# Patient Record
Sex: Female | Born: 1945
Health system: Southern US, Community
[De-identification: ages and names within clinical notes are randomized; demographics above are authoritative.]

## PROBLEM LIST (undated history)

## (undated) DIAGNOSIS — N200 Calculus of kidney: Secondary | ICD-10-CM

## (undated) DIAGNOSIS — B029 Zoster without complications: Secondary | ICD-10-CM

## (undated) DIAGNOSIS — I8310 Varicose veins of unspecified lower extremity with inflammation: Secondary | ICD-10-CM

## (undated) DIAGNOSIS — T7840XA Allergy, unspecified, initial encounter: Secondary | ICD-10-CM

## (undated) DIAGNOSIS — Z87440 Personal history of urinary (tract) infections: Secondary | ICD-10-CM

## (undated) DIAGNOSIS — R896 Abnormal cytological findings in specimens from other organs, systems and tissues: Secondary | ICD-10-CM

## (undated) DIAGNOSIS — C44209 Unspecified malignant neoplasm of skin of left ear and external auricular canal: Secondary | ICD-10-CM

## (undated) DIAGNOSIS — R112 Nausea with vomiting, unspecified: Secondary | ICD-10-CM

## (undated) DIAGNOSIS — Z9889 Other specified postprocedural states: Secondary | ICD-10-CM

## (undated) DIAGNOSIS — J189 Pneumonia, unspecified organism: Secondary | ICD-10-CM

## (undated) DIAGNOSIS — J45901 Unspecified asthma with (acute) exacerbation: Secondary | ICD-10-CM

## (undated) DIAGNOSIS — I4891 Unspecified atrial fibrillation: Secondary | ICD-10-CM

## (undated) DIAGNOSIS — M1990 Unspecified osteoarthritis, unspecified site: Secondary | ICD-10-CM

## (undated) DIAGNOSIS — M810 Age-related osteoporosis without current pathological fracture: Secondary | ICD-10-CM

## (undated) DIAGNOSIS — M8580 Other specified disorders of bone density and structure, unspecified site: Secondary | ICD-10-CM

## (undated) DIAGNOSIS — E785 Hyperlipidemia, unspecified: Secondary | ICD-10-CM

## (undated) DIAGNOSIS — Z87442 Personal history of urinary calculi: Secondary | ICD-10-CM

## (undated) HISTORY — DX: Abnormal cytological findings in specimens from other organs, systems and tissues: R89.6

## (undated) HISTORY — DX: Unspecified atrial fibrillation: I48.91

## (undated) HISTORY — DX: Varicose veins of unspecified lower extremity with inflammation: I83.10

## (undated) HISTORY — PX: KNEE ARTHROSCOPY: SHX127

## (undated) HISTORY — DX: Unspecified osteoarthritis, unspecified site: M19.90

## (undated) HISTORY — PX: OTHER SURGICAL HISTORY: SHX169

## (undated) HISTORY — PX: COLPOSCOPY: SHX161

## (undated) HISTORY — DX: Calculus of kidney: N20.0

## (undated) HISTORY — DX: Unspecified malignant neoplasm of skin of left ear and external auricular canal: C44.209

## (undated) HISTORY — DX: Hyperlipidemia, unspecified: E78.5

## (undated) HISTORY — DX: Allergy, unspecified, initial encounter: T78.40XA

## (undated) HISTORY — DX: Unspecified asthma with (acute) exacerbation: J45.901

## (undated) HISTORY — DX: Age-related osteoporosis without current pathological fracture: M81.0

## (undated) HISTORY — DX: Zoster without complications: B02.9

## (undated) HISTORY — DX: Other specified disorders of bone density and structure, unspecified site: M85.80

---

## 1998-01-11 ENCOUNTER — Other Ambulatory Visit: Admission: RE | Admit: 1998-01-11 | Discharge: 1998-01-11 | Payer: Self-pay | Admitting: Gynecology

## 1998-08-01 ENCOUNTER — Other Ambulatory Visit: Admission: RE | Admit: 1998-08-01 | Discharge: 1998-08-01 | Payer: Self-pay | Admitting: Gynecology

## 1999-03-02 ENCOUNTER — Other Ambulatory Visit: Admission: RE | Admit: 1999-03-02 | Discharge: 1999-03-02 | Payer: Self-pay | Admitting: Gynecology

## 1999-12-13 ENCOUNTER — Other Ambulatory Visit: Admission: RE | Admit: 1999-12-13 | Discharge: 1999-12-13 | Payer: Self-pay | Admitting: Gynecology

## 2000-12-27 ENCOUNTER — Other Ambulatory Visit: Admission: RE | Admit: 2000-12-27 | Discharge: 2000-12-27 | Payer: Self-pay | Admitting: Obstetrics and Gynecology

## 2000-12-30 ENCOUNTER — Other Ambulatory Visit: Admission: RE | Admit: 2000-12-30 | Discharge: 2000-12-30 | Payer: Self-pay | Admitting: Obstetrics and Gynecology

## 2002-04-14 ENCOUNTER — Other Ambulatory Visit: Admission: RE | Admit: 2002-04-14 | Discharge: 2002-04-14 | Payer: Self-pay | Admitting: Obstetrics and Gynecology

## 2002-12-16 ENCOUNTER — Other Ambulatory Visit: Admission: RE | Admit: 2002-12-16 | Discharge: 2002-12-16 | Payer: Self-pay | Admitting: Obstetrics and Gynecology

## 2004-04-19 ENCOUNTER — Other Ambulatory Visit: Admission: RE | Admit: 2004-04-19 | Discharge: 2004-04-19 | Payer: Self-pay | Admitting: Obstetrics and Gynecology

## 2004-08-17 ENCOUNTER — Ambulatory Visit: Payer: Self-pay | Admitting: Internal Medicine

## 2004-08-31 ENCOUNTER — Encounter: Admission: RE | Admit: 2004-08-31 | Discharge: 2004-08-31 | Payer: Self-pay | Admitting: Internal Medicine

## 2004-12-04 ENCOUNTER — Ambulatory Visit: Payer: Self-pay | Admitting: Internal Medicine

## 2005-01-18 ENCOUNTER — Ambulatory Visit: Payer: Self-pay | Admitting: Internal Medicine

## 2005-04-06 ENCOUNTER — Ambulatory Visit: Payer: Self-pay | Admitting: Internal Medicine

## 2005-05-02 ENCOUNTER — Other Ambulatory Visit: Admission: RE | Admit: 2005-05-02 | Discharge: 2005-05-02 | Payer: Self-pay | Admitting: Obstetrics and Gynecology

## 2005-07-19 ENCOUNTER — Ambulatory Visit: Payer: Self-pay | Admitting: Internal Medicine

## 2005-08-21 ENCOUNTER — Ambulatory Visit: Payer: Self-pay | Admitting: Internal Medicine

## 2005-09-25 ENCOUNTER — Ambulatory Visit: Payer: Self-pay | Admitting: Internal Medicine

## 2005-10-02 ENCOUNTER — Ambulatory Visit: Payer: Self-pay | Admitting: Internal Medicine

## 2005-10-15 DIAGNOSIS — IMO0001 Reserved for inherently not codable concepts without codable children: Secondary | ICD-10-CM

## 2005-10-15 HISTORY — DX: Reserved for inherently not codable concepts without codable children: IMO0001

## 2005-11-27 ENCOUNTER — Other Ambulatory Visit: Admission: RE | Admit: 2005-11-27 | Discharge: 2005-11-27 | Payer: Self-pay | Admitting: Obstetrics and Gynecology

## 2005-12-04 ENCOUNTER — Ambulatory Visit: Payer: Self-pay | Admitting: Internal Medicine

## 2006-02-05 ENCOUNTER — Ambulatory Visit: Payer: Self-pay | Admitting: Internal Medicine

## 2006-02-12 ENCOUNTER — Encounter: Payer: Self-pay | Admitting: Internal Medicine

## 2006-02-12 ENCOUNTER — Ambulatory Visit: Payer: Self-pay

## 2006-02-18 ENCOUNTER — Ambulatory Visit: Payer: Self-pay | Admitting: Internal Medicine

## 2006-03-12 ENCOUNTER — Ambulatory Visit (HOSPITAL_BASED_OUTPATIENT_CLINIC_OR_DEPARTMENT_OTHER): Admission: RE | Admit: 2006-03-12 | Discharge: 2006-03-12 | Payer: Self-pay | Admitting: Orthopedic Surgery

## 2006-04-30 ENCOUNTER — Other Ambulatory Visit: Admission: RE | Admit: 2006-04-30 | Discharge: 2006-04-30 | Payer: Self-pay | Admitting: Obstetrics and Gynecology

## 2006-07-30 ENCOUNTER — Ambulatory Visit: Payer: Self-pay | Admitting: Internal Medicine

## 2006-07-31 LAB — CONVERTED CEMR LAB
Free T4: 0.8 ng/dL — ABNORMAL LOW (ref 0.9–1.8)
TSH: 0.83 microintl units/mL (ref 0.35–5.50)

## 2006-08-12 ENCOUNTER — Encounter: Admission: RE | Admit: 2006-08-12 | Discharge: 2006-08-12 | Payer: Self-pay | Admitting: Internal Medicine

## 2006-08-29 ENCOUNTER — Ambulatory Visit: Payer: Self-pay

## 2006-09-03 ENCOUNTER — Ambulatory Visit: Payer: Self-pay | Admitting: Internal Medicine

## 2006-10-15 HISTORY — PX: SPINE SURGERY: SHX786

## 2006-10-25 ENCOUNTER — Other Ambulatory Visit: Admission: RE | Admit: 2006-10-25 | Discharge: 2006-10-25 | Payer: Self-pay | Admitting: Obstetrics and Gynecology

## 2006-10-31 ENCOUNTER — Ambulatory Visit: Payer: Self-pay | Admitting: Internal Medicine

## 2006-12-30 ENCOUNTER — Ambulatory Visit: Payer: Self-pay | Admitting: Internal Medicine

## 2007-01-30 ENCOUNTER — Ambulatory Visit: Payer: Self-pay | Admitting: Internal Medicine

## 2007-03-27 ENCOUNTER — Ambulatory Visit: Payer: Self-pay | Admitting: Internal Medicine

## 2007-04-16 ENCOUNTER — Ambulatory Visit: Payer: Self-pay | Admitting: Internal Medicine

## 2007-04-21 ENCOUNTER — Encounter: Admission: RE | Admit: 2007-04-21 | Discharge: 2007-04-21 | Payer: Self-pay | Admitting: Internal Medicine

## 2007-04-24 DIAGNOSIS — M1990 Unspecified osteoarthritis, unspecified site: Secondary | ICD-10-CM

## 2007-04-24 DIAGNOSIS — M810 Age-related osteoporosis without current pathological fracture: Secondary | ICD-10-CM | POA: Insufficient documentation

## 2007-04-24 HISTORY — DX: Unspecified osteoarthritis, unspecified site: M19.90

## 2007-04-30 ENCOUNTER — Encounter: Payer: Self-pay | Admitting: Internal Medicine

## 2007-05-06 ENCOUNTER — Other Ambulatory Visit: Admission: RE | Admit: 2007-05-06 | Discharge: 2007-05-06 | Payer: Self-pay | Admitting: Obstetrics and Gynecology

## 2007-05-09 ENCOUNTER — Encounter (INDEPENDENT_AMBULATORY_CARE_PROVIDER_SITE_OTHER): Payer: Self-pay | Admitting: Neurosurgery

## 2007-05-09 ENCOUNTER — Ambulatory Visit (HOSPITAL_COMMUNITY): Admission: RE | Admit: 2007-05-09 | Discharge: 2007-05-10 | Payer: Self-pay | Admitting: Neurosurgery

## 2007-05-09 ENCOUNTER — Encounter: Payer: Self-pay | Admitting: Internal Medicine

## 2007-06-04 ENCOUNTER — Encounter: Payer: Self-pay | Admitting: Internal Medicine

## 2007-07-15 ENCOUNTER — Encounter: Payer: Self-pay | Admitting: Internal Medicine

## 2007-08-11 ENCOUNTER — Telehealth: Payer: Self-pay | Admitting: Internal Medicine

## 2007-12-17 ENCOUNTER — Other Ambulatory Visit: Admission: RE | Admit: 2007-12-17 | Discharge: 2007-12-17 | Payer: Self-pay | Admitting: Obstetrics and Gynecology

## 2008-01-02 ENCOUNTER — Encounter: Payer: Self-pay | Admitting: Internal Medicine

## 2008-01-29 ENCOUNTER — Ambulatory Visit: Payer: Self-pay | Admitting: Internal Medicine

## 2008-01-29 LAB — CONVERTED CEMR LAB
ALT: 44 units/L — ABNORMAL HIGH (ref 0–35)
AST: 38 units/L — ABNORMAL HIGH (ref 0–37)
Albumin: 3.9 g/dL (ref 3.5–5.2)
BUN: 25 mg/dL — ABNORMAL HIGH (ref 6–23)
Bilirubin Urine: NEGATIVE
CO2: 32 meq/L (ref 19–32)
Chloride: 105 meq/L (ref 96–112)
Cholesterol: 191 mg/dL (ref 0–200)
Creatinine, Ser: 0.6 mg/dL (ref 0.4–1.2)
Ketones, urine, test strip: NEGATIVE
Specific Gravity, Urine: >=1.03
TSH: 1.04 microintl units/mL (ref 0.35–5.50)
Triglycerides: 62 mg/dL (ref 0–149)
pH: 6.5

## 2008-01-30 ENCOUNTER — Telehealth: Payer: Self-pay | Admitting: Internal Medicine

## 2008-02-05 ENCOUNTER — Ambulatory Visit: Payer: Self-pay | Admitting: Internal Medicine

## 2008-02-05 DIAGNOSIS — I8310 Varicose veins of unspecified lower extremity with inflammation: Secondary | ICD-10-CM | POA: Insufficient documentation

## 2008-02-05 DIAGNOSIS — J449 Chronic obstructive pulmonary disease, unspecified: Secondary | ICD-10-CM

## 2008-02-05 HISTORY — DX: Varicose veins of unspecified lower extremity with inflammation: I83.10

## 2008-02-24 ENCOUNTER — Encounter: Admission: RE | Admit: 2008-02-24 | Discharge: 2008-02-24 | Payer: Self-pay | Admitting: Internal Medicine

## 2008-03-11 ENCOUNTER — Encounter: Admission: RE | Admit: 2008-03-11 | Discharge: 2008-03-11 | Payer: Self-pay | Admitting: Interventional Radiology

## 2008-04-13 ENCOUNTER — Encounter: Admission: RE | Admit: 2008-04-13 | Discharge: 2008-04-13 | Payer: Self-pay | Admitting: Interventional Radiology

## 2008-04-20 ENCOUNTER — Encounter: Admission: RE | Admit: 2008-04-20 | Discharge: 2008-04-20 | Payer: Self-pay | Admitting: Interventional Radiology

## 2008-04-28 ENCOUNTER — Ambulatory Visit: Payer: Self-pay | Admitting: Internal Medicine

## 2008-05-07 ENCOUNTER — Ambulatory Visit: Payer: Self-pay | Admitting: Internal Medicine

## 2008-05-07 DIAGNOSIS — J309 Allergic rhinitis, unspecified: Secondary | ICD-10-CM

## 2008-05-12 ENCOUNTER — Encounter: Admission: RE | Admit: 2008-05-12 | Discharge: 2008-05-12 | Payer: Self-pay | Admitting: Interventional Radiology

## 2008-05-25 ENCOUNTER — Encounter: Admission: RE | Admit: 2008-05-25 | Discharge: 2008-05-25 | Payer: Self-pay | Admitting: Interventional Radiology

## 2008-08-06 ENCOUNTER — Ambulatory Visit: Payer: Self-pay | Admitting: Internal Medicine

## 2008-09-03 ENCOUNTER — Ambulatory Visit: Payer: Self-pay | Admitting: Internal Medicine

## 2008-11-08 ENCOUNTER — Telehealth: Payer: Self-pay | Admitting: Internal Medicine

## 2008-11-08 ENCOUNTER — Ambulatory Visit: Payer: Self-pay | Admitting: Internal Medicine

## 2008-11-08 DIAGNOSIS — J45901 Unspecified asthma with (acute) exacerbation: Secondary | ICD-10-CM | POA: Insufficient documentation

## 2008-11-08 DIAGNOSIS — B029 Zoster without complications: Secondary | ICD-10-CM

## 2008-11-08 HISTORY — DX: Unspecified asthma with (acute) exacerbation: J45.901

## 2008-11-08 HISTORY — DX: Zoster without complications: B02.9

## 2008-11-30 ENCOUNTER — Encounter: Payer: Self-pay | Admitting: Internal Medicine

## 2008-11-30 ENCOUNTER — Encounter: Admission: RE | Admit: 2008-11-30 | Discharge: 2008-11-30 | Payer: Self-pay | Admitting: Interventional Radiology

## 2009-02-03 ENCOUNTER — Ambulatory Visit: Payer: Self-pay | Admitting: Internal Medicine

## 2009-02-03 LAB — CONVERTED CEMR LAB
ALT: 22 units/L (ref 0–35)
Albumin: 4 g/dL (ref 3.5–5.2)
BUN: 24 mg/dL — ABNORMAL HIGH (ref 6–23)
Basophils Relative: 0.6 % (ref 0.0–3.0)
Bilirubin Urine: NEGATIVE
CO2: 31 meq/L (ref 19–32)
Calcium: 9.6 mg/dL (ref 8.4–10.5)
Cholesterol: 232 mg/dL — ABNORMAL HIGH (ref 0–200)
Creatinine, Ser: 0.8 mg/dL (ref 0.4–1.2)
Eosinophils Relative: 2 % (ref 0.0–5.0)
HCT: 42.4 % (ref 36.0–46.0)
Hemoglobin: 14.6 g/dL (ref 12.0–15.0)
Ketones, ur: NEGATIVE mg/dL
Leukocytes, UA: NEGATIVE
Lymphs Abs: 1.1 10*3/uL (ref 0.7–4.0)
MCV: 95.7 fL (ref 78.0–100.0)
Monocytes Absolute: 0.4 10*3/uL (ref 0.1–1.0)
Neutro Abs: 2.7 10*3/uL (ref 1.4–7.7)
Nitrite: NEGATIVE
Platelets: 171 10*3/uL (ref 150.0–400.0)
TSH: 1.75 microintl units/mL (ref 0.35–5.50)
Total Bilirubin: 0.9 mg/dL (ref 0.3–1.2)
Total Protein: 7.2 g/dL (ref 6.0–8.3)
Triglycerides: 44 mg/dL (ref 0.0–149.0)
Urobilinogen, UA: 0.2 (ref 0.0–1.0)
WBC: 4.3 10*3/uL — ABNORMAL LOW (ref 4.5–10.5)

## 2009-02-09 ENCOUNTER — Ambulatory Visit: Payer: Self-pay | Admitting: Internal Medicine

## 2009-02-09 DIAGNOSIS — E785 Hyperlipidemia, unspecified: Secondary | ICD-10-CM

## 2009-02-09 HISTORY — DX: Hyperlipidemia, unspecified: E78.5

## 2009-02-18 ENCOUNTER — Telehealth: Payer: Self-pay | Admitting: *Deleted

## 2009-02-21 ENCOUNTER — Encounter: Payer: Self-pay | Admitting: Internal Medicine

## 2009-03-01 ENCOUNTER — Encounter: Admission: RE | Admit: 2009-03-01 | Discharge: 2009-03-01 | Payer: Self-pay | Admitting: Interventional Radiology

## 2009-03-15 ENCOUNTER — Encounter: Admission: RE | Admit: 2009-03-15 | Discharge: 2009-03-15 | Payer: Self-pay | Admitting: Interventional Radiology

## 2009-03-22 ENCOUNTER — Encounter: Admission: RE | Admit: 2009-03-22 | Discharge: 2009-03-22 | Payer: Self-pay | Admitting: Interventional Radiology

## 2009-03-31 ENCOUNTER — Encounter: Payer: Self-pay | Admitting: Internal Medicine

## 2009-03-31 ENCOUNTER — Ambulatory Visit: Payer: Self-pay | Admitting: Obstetrics and Gynecology

## 2009-03-31 ENCOUNTER — Encounter: Payer: Self-pay | Admitting: Obstetrics and Gynecology

## 2009-03-31 ENCOUNTER — Other Ambulatory Visit: Admission: RE | Admit: 2009-03-31 | Discharge: 2009-03-31 | Payer: Self-pay | Admitting: Obstetrics and Gynecology

## 2009-04-12 ENCOUNTER — Encounter: Admission: RE | Admit: 2009-04-12 | Discharge: 2009-04-12 | Payer: Self-pay | Admitting: Interventional Radiology

## 2009-04-25 ENCOUNTER — Ambulatory Visit: Payer: Self-pay | Admitting: Obstetrics and Gynecology

## 2009-06-14 LAB — CONVERTED CEMR LAB: Pap Smear: NORMAL

## 2009-07-04 ENCOUNTER — Telehealth: Payer: Self-pay | Admitting: Internal Medicine

## 2009-10-01 ENCOUNTER — Ambulatory Visit: Payer: Self-pay | Admitting: Internal Medicine

## 2009-10-12 ENCOUNTER — Ambulatory Visit: Payer: Self-pay | Admitting: Internal Medicine

## 2009-11-29 ENCOUNTER — Encounter: Admission: RE | Admit: 2009-11-29 | Discharge: 2009-11-29 | Payer: Self-pay | Admitting: Interventional Radiology

## 2010-01-31 ENCOUNTER — Ambulatory Visit: Payer: Self-pay | Admitting: Internal Medicine

## 2010-01-31 ENCOUNTER — Telehealth: Payer: Self-pay | Admitting: Internal Medicine

## 2010-01-31 LAB — CONVERTED CEMR LAB
AST: 22 units/L (ref 0–37)
Albumin: 3.6 g/dL (ref 3.5–5.2)
Alkaline Phosphatase: 77 units/L (ref 39–117)
BUN: 25 mg/dL — ABNORMAL HIGH (ref 6–23)
Basophils Absolute: 0 10*3/uL (ref 0.0–0.1)
Bilirubin Urine: NEGATIVE
CO2: 31 meq/L (ref 19–32)
Calcium: 9.2 mg/dL (ref 8.4–10.5)
Direct LDL: 133.4 mg/dL
Eosinophils Absolute: 0.1 10*3/uL (ref 0.0–0.7)
GFR calc non Af Amer: 89.79 mL/min (ref >60)
Glucose, Bld: 90 mg/dL (ref 70–99)
HDL: 62.8 mg/dL (ref >39.00)
Lymphocytes Relative: 15.5 % (ref 12.0–46.0)
Lymphs Abs: 1.2 10*3/uL (ref 0.7–4.0)
MCHC: 34.2 g/dL (ref 30.0–36.0)
Monocytes Relative: 3.2 % (ref 3.0–12.0)
Neutro Abs: 6.2 10*3/uL (ref 1.4–7.7)
Platelets: 154 10*3/uL (ref 150.0–400.0)
RDW: 12.9 % (ref 11.5–14.6)
TSH: 2 microintl units/mL (ref 0.35–5.50)
Total Bilirubin: 0.7 mg/dL (ref 0.3–1.2)
Triglycerides: 57 mg/dL (ref 0.0–149.0)
Urine Glucose: NEGATIVE mg/dL
Urobilinogen, UA: 0.2 (ref 0.0–1.0)
VLDL: 11.4 mg/dL (ref 0.0–40.0)

## 2010-01-31 IMAGING — US US EXTREM LOW VENOUS*R*
1 series · 13 of 24 positions shown · non-contrast
Comparison: none

CLINICAL DATA: 62-year-old female with painful varicose veins.
BILATERAL LOWER EXTREMITY DUPLEX VENOUS ULTRASOUND OF THE
SUPERFICIAL AND DEEP VENOUS SYSTEMS:

[Series 1: us extrem low venous*right* · 13 of 68 slices shown]
[im 1/68]
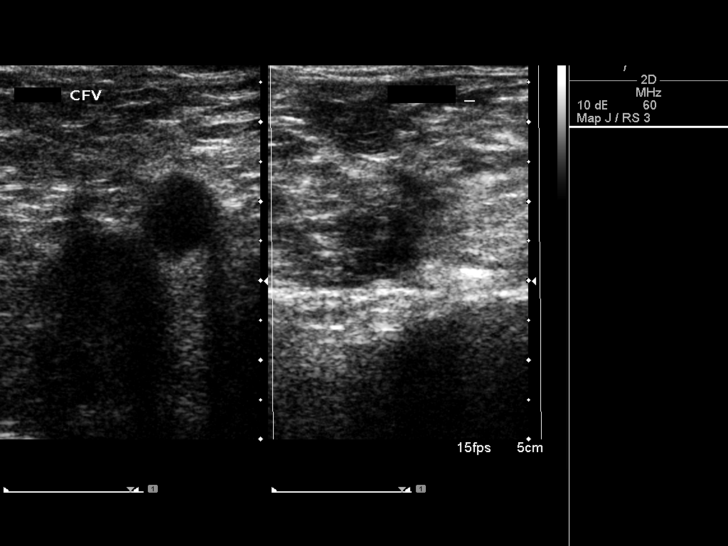
[im 6/68]
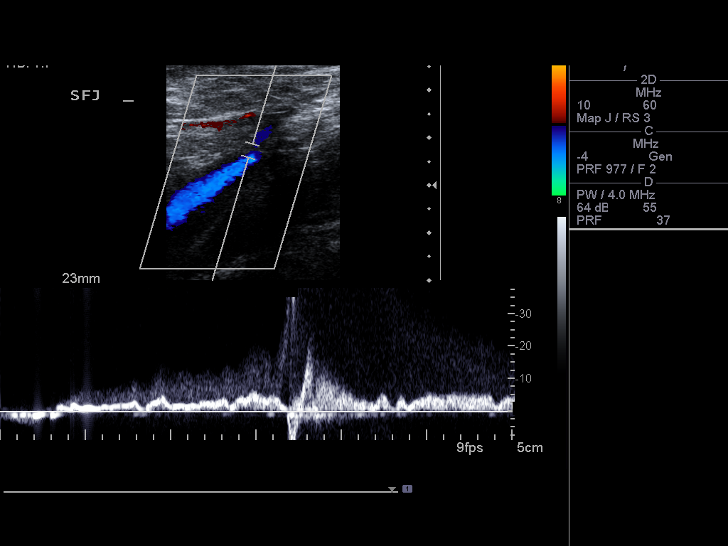
[im 12/68]
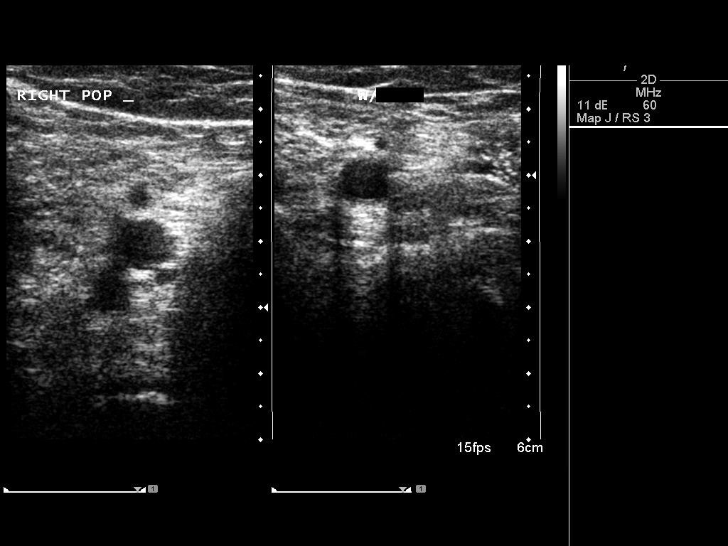
[im 18/68]
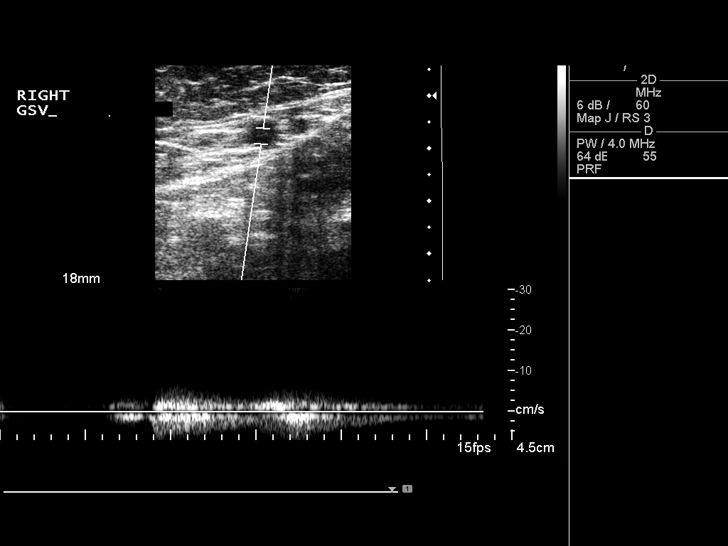
[im 24/68]
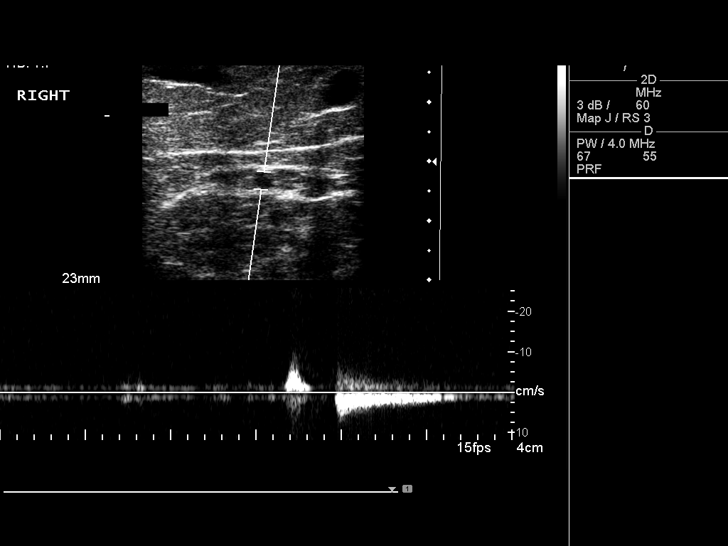
[im 30/68]
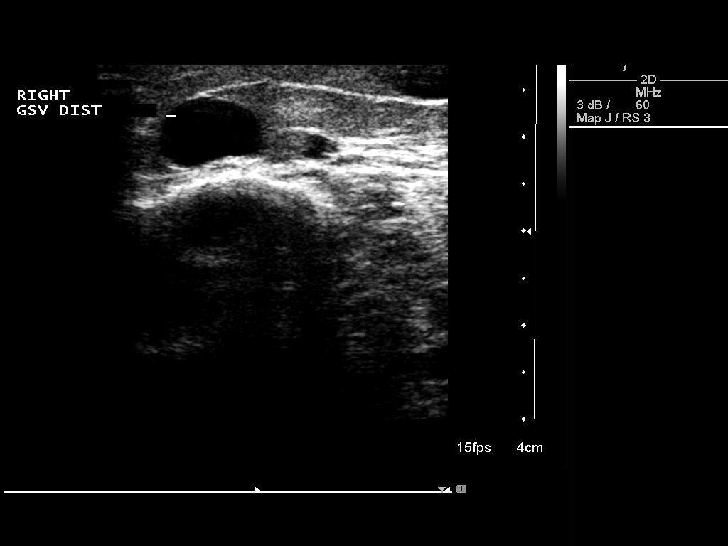
[im 35/68]
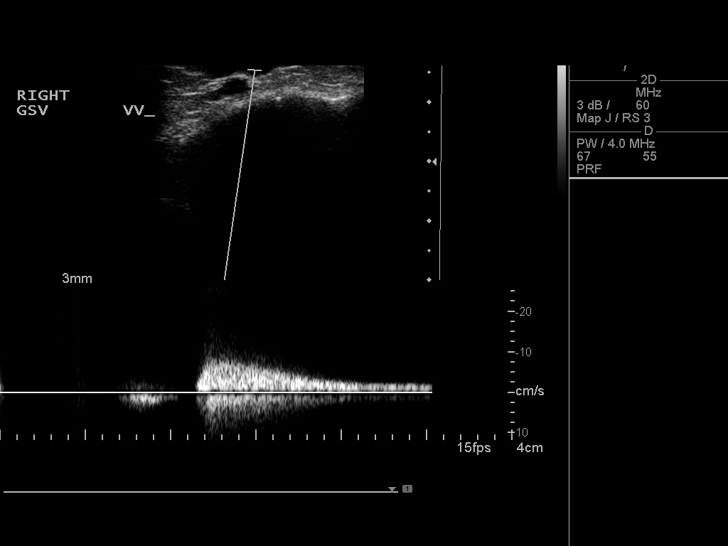
[im 38/68]
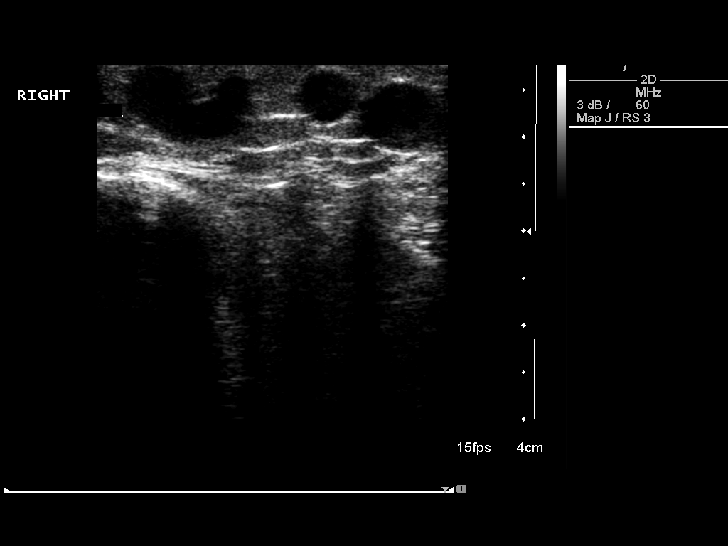
[im 44/68]
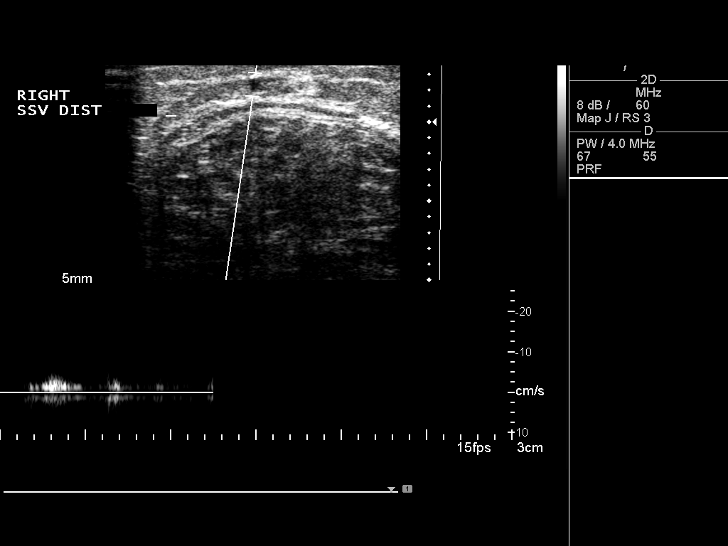
[im 50/68]
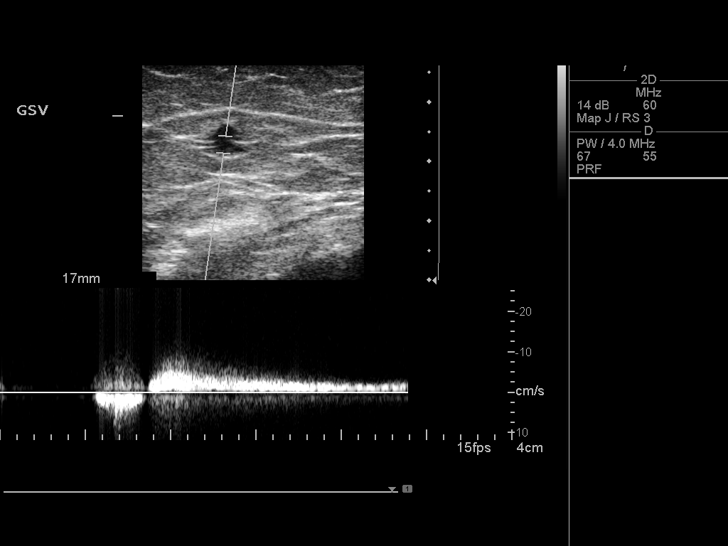
[im 56/68]
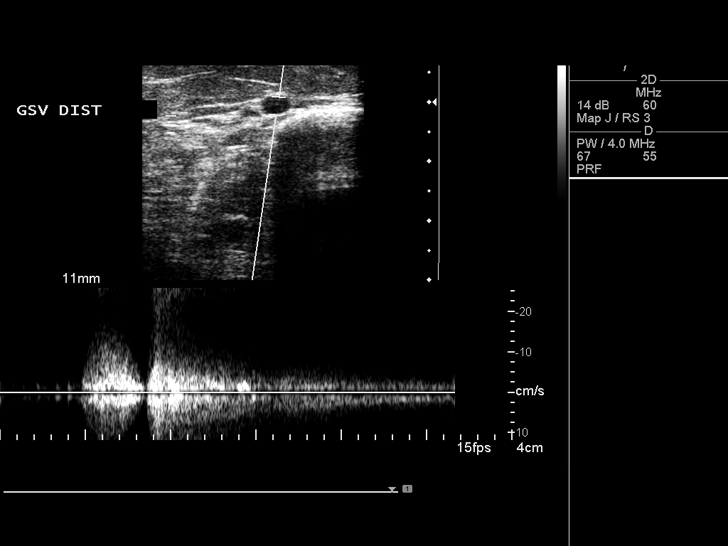
[im 62/68]
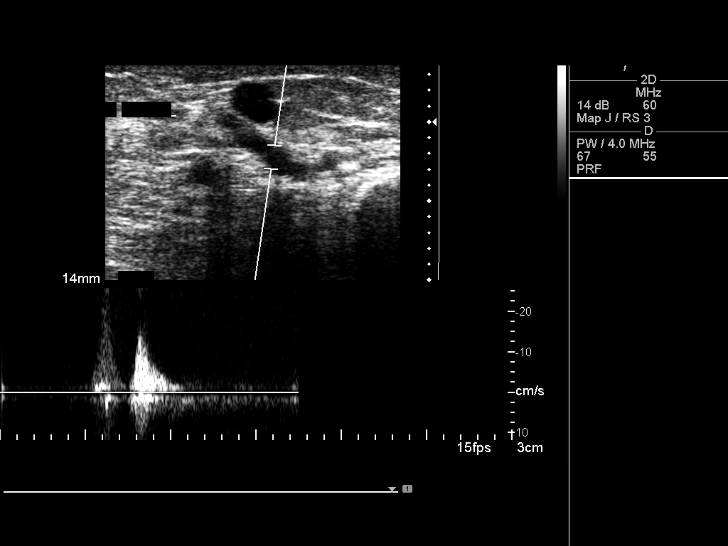
[im 68/68]
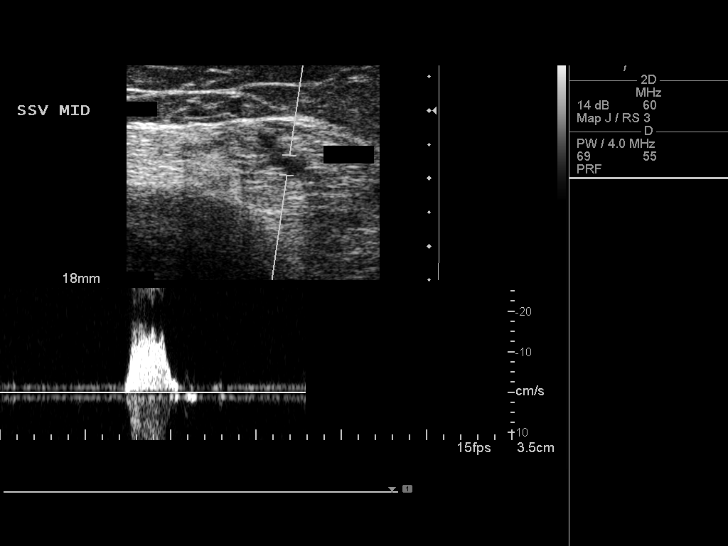

[13 of 24 positions shown; findings below may reference images not displayed]

FINDINGS: RIGHT LOWER EXTREMITY:
The right great saphenous vein demonstrates venous insufficiency
and incompetence just inferior to the saphenofemoral junction
approximately 10 to 15 mm extending through the right thigh region
and into the calf.  There are branching right thigh and calf
tributary varicose veins which are the patient's symptomatic veins
related to the underlying great saphenous venous insufficiency.
There is also an incompetent  perforator in the distal calf region,
with direct communication to the varicose veins.  The right small
saphenous vein is normal.  The deep venous system is normal, with
normal compressibility and augmentation and no evidence of DVT.
LEFT LOWER EXTREMITY:
The left great saphenous vein demonstrates venous
insufficiency/reflux throughout its entire course with an
incompetent saphenofemoral junction.  The GSV is dilated to 12 mm.
Small associated varicose veins are noted.  The left small
saphenous vein is normal.  There is no evidence of DVT.
Specifically, the left common femoral, femoral, and popliteal veins
demonstrate normal compressibility and augmentation.
IMPRESSION: 1.  Right great saphenous venous insufficiency throughout the thigh
and calf region resulting in symptomatic medium to large sized
tributary varicosities.  The pattern of venous disease is amenable
to endovenous laser treatment.
2.  Associated incompetent perforator vein in the right distal
calf.
3.  Left great saphenous venous insufficiency throughout the entire
course communicating to small varicose veins.
4.  Normal small saphenous venous anatomy.
5.  No DVT.

## 2010-02-10 ENCOUNTER — Ambulatory Visit: Payer: Self-pay | Admitting: Internal Medicine

## 2010-02-10 LAB — CONVERTED CEMR LAB
Cholesterol, target level: 200 mg/dL
HDL goal, serum: 40 mg/dL
LDL Goal: 160 mg/dL

## 2010-03-17 ENCOUNTER — Telehealth: Payer: Self-pay | Admitting: Internal Medicine

## 2010-04-03 ENCOUNTER — Ambulatory Visit: Payer: Self-pay | Admitting: Obstetrics and Gynecology

## 2010-04-03 ENCOUNTER — Other Ambulatory Visit: Admission: RE | Admit: 2010-04-03 | Discharge: 2010-04-03 | Payer: Self-pay | Admitting: Obstetrics and Gynecology

## 2010-04-04 ENCOUNTER — Ambulatory Visit: Payer: Self-pay | Admitting: Obstetrics and Gynecology

## 2010-04-18 ENCOUNTER — Encounter: Payer: Self-pay | Admitting: Internal Medicine

## 2010-11-14 NOTE — Assessment & Plan Note (Signed)
Summary: CPX -NO PAP//CCM   Vital Signs:  Patient profile:   65 year old female Height:      63 inches Weight:      219 pounds BMI:     38.93 Temp:     98.2 degrees F oral Pulse (ortho):   72 / minute Resp:     14 per minute BP sitting:   110 / 70  (left arm)  Vitals Entered By: Willy Eddy, LPN (February 10, 2010 11:25 AM) CC: cpx-no colonsocopy--completed cipro for uti from cpx labs, Lipid Management   CC:  cpx-no colonsocopy--completed cipro for uti from [facility] and Lipid Management.  History of Present Illness: tHE PT STILL KEEPS GAINING WEIGHT The pt was asked about all immunizations, health maint. services that are appropriate to their age and was given guidance on diet exercize  and weight management    Lipid Management History:      Positive NCEP/ATP III risk factors include female age 28 years old or older.  Negative NCEP/ATP III risk factors include HDL cholesterol greater than 60, no family history for ischemic heart disease, and non-tobacco-user status.    Preventive Screening-Counseling & Management  Alcohol-Tobacco     Smoking Status: never     Passive Smoke Exposure: yes  Problems Prior to Update: 1)  Hyperlipidemia, Borderline  (ICD-272.4) 2)  Shingles  (ICD-053.9) 3)  Asthma Unspecified With Exacerbation  (ICD-493.92) 4)  Sinusitis, Chronic  (ICD-473.9) 5)  Obesity, Unspecified  (ICD-278.00) 6)  Family History of Melanoma  (ICD-V16.8) 7)  Family History Breast Cancer 1st Degree Relative <50  (ICD-V16.3) 8)  Allergic Rhinitis  (ICD-477.9) 9)  Acute Frontal Sinusitis  (ICD-461.1) 10)  Varicose Veins Lower Extremities W/inflammation  (ICD-454.1) 11)  Extrinsic Asthma, Unspecified  (ICD-493.00) 12)  Asthmatic Bronchitis, Acute  (ICD-466.0) 13)  Physical Examination  (ICD-V70.0) 14)  Hrt  (ICD-V07.4) 15)  Osteoporosis  (ICD-733.00) 16)  Osteoarthritis  (ICD-715.90)  Current Problems (verified): 1)  Hyperlipidemia, Borderline  (ICD-272.4) 2)   Shingles  (ICD-053.9) 3)  Asthma Unspecified With Exacerbation  (ICD-493.92) 4)  Sinusitis, Chronic  (ICD-473.9) 5)  Obesity, Unspecified  (ICD-278.00) 6)  Family History of Melanoma  (ICD-V16.8) 7)  Family History Breast Cancer 1st Degree Relative <50  (ICD-V16.3) 8)  Allergic Rhinitis  (ICD-477.9) 9)  Acute Frontal Sinusitis  (ICD-461.1) 10)  Varicose Veins Lower Extremities W/inflammation  (ICD-454.1) 11)  Extrinsic Asthma, Unspecified  (ICD-493.00) 12)  Asthmatic Bronchitis, Acute  (ICD-466.0) 13)  Physical Examination  (ICD-V70.0) 14)  Hrt  (ICD-V07.4) 15)  Osteoporosis  (ICD-733.00) 16)  Osteoarthritis  (ICD-715.90)  Medications Prior to Update: 1)  Mobic 7.5 Mg Tabs (Meloxicam) .... Two Times A Day 2)  Glucosamine Chondr 1500 Complx   Caps (Glucosamine-Chondroit-Vit C-Mn) .... Once Daily 3)  Adult Aspirin Ec Low Strength 81 Mg  Tbec (Aspirin) .... Once Daily 4)  Caltrate 600 1500 Mg  Tabs (Calcium Carbonate) .... Once Daily 5)  Qvar 80 Mcg/act  Aers (Beclomethasone Dipropionate) .... Two Puff By Mouth Daily 6)  Zyrtec Allergy 10 Mg  Tabs (Cetirizine Hcl) .Marland Kitchen.. 1 At Bedtime 7)  Cvs Saline Nasal Spray 0.65 % Soln (Saline) .... As Needed 8)  Tobramycin-Dexamethasone 0.3-0.1 % Susp (Tobramycin-Dexamethasone) .... Two Drop  in Each Eye Every 2-3 Hours Today and Then Qid 9)  Cipro 500 Mg Tabs (Ciprofloxacin Hcl) .Marland Kitchen.. 1 Two Times A Day For 5 Days  Current Medications (verified): 1)  Mobic 7.5 Mg Tabs (Meloxicam) .... Two Times A  Day 2)  Glucosamine Chondr 1500 Complx   Caps (Glucosamine-Chondroit-Vit C-Mn) .... Once Daily 3)  Adult Aspirin Ec Low Strength 81 Mg  Tbec (Aspirin) .... Once Daily 4)  Caltrate 600 1500 Mg  Tabs (Calcium Carbonate) .... Once Daily 5)  Qvar 80 Mcg/act  Aers (Beclomethasone Dipropionate) .... Two Puff By Mouth Daily 6)  Zyrtec Allergy 10 Mg  Tabs (Cetirizine Hcl) .Marland Kitchen.. 1 At Bedtime 7)  Cvs Saline Nasal Spray 0.65 % Soln (Saline) .... 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  Past Medical History: Reviewed history from 05/07/2008 and no changes required. Osteoarthritis Osteoporosis venous insufiency Allergic rhinitis  Past Surgical History: Reviewed history from 08/06/2008 and no changes required. Rotator cuff repair knee arhtroscopy Lumbar laminectomy Lumbar fusion   july 2008  Family History: Reviewed history from 05/07/2008 and no changes required. Family History Breast cancer 1st degree relative <50 Family History Hypertension Family History of Melanoma Family History Other cancer  Social History: Reviewed history from 10/01/2009 and no changes required. 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    Physical Exam  General:  Well-developed,well-nourished,in no acute distress; alert,appropriate and cooperative throughout examination  obvious left eye redness  Head:  normocephalic and atraumatic.     Eyes:  left eye with 2+ redness and mucoid dc no photophobia right eye with 2 + redness and swollen periorbital region Ears:  R ear normal and L ear normal.   Nose:  no external deformity, no external erythema, and no nasal discharge.   Mouth:  pharynx pink and moist.   Neck:  No deformities, masses, or tenderness noted. Lungs:  normal respiratory effort and no intercostal retractions.   Abdomen:  soft, non-tender, and normal bowel sounds.   Msk:  normal ROM and no joint tenderness.   Pulses:  R radial normal and L radial normal.   Extremities:  trace left pedal edema and trace right pedal edema.   Neurologic:  alert & oriented X3 and gait normal.     Impression & Recommendations:  Problem # 1:  HYPERLIPIDEMIA, BORDERLINE (ICD-272.4) ad fish oil three a ay Labs Reviewed: SGOT: 22 (01/31/2010)   SGPT: 19 (01/31/2010)   HDL:62.80 (01/31/2010), 63.40 (02/03/2009)  LDL:118 (01/29/2008)  Chol:209 (01/31/2010), 232 (02/03/2009)  Trig:57.0 (01/31/2010), 44.0 (02/03/2009)  Problem # 2:  PHYSICAL EXAMINATION (ICD-V70.0) The pt was asked about all immunizations, health maint. services that are appropriate to their age and was given guidance on diet exercize  and weight management  Mammogram: normal (04/13/2009) Pap smear: normal (06/14/2009) Td Booster: Tdap (02/05/2008)   Flu Vax: Fluvax 3+ (08/06/2008)   Pneumovax: Historical (08/15/2000) Chol: 209 (01/31/2010)   HDL: 62.80 (01/31/2010)   LDL: 118 (01/29/2008)   TG:  57.0 (01/31/2010) TSH: 2.00 (01/31/2010)   Next mammogram due:: 04/2010 (02/10/2010)  Discussed using sunscreen, use of alcohol, drug use, self breast exam, routine dental care, routine eye care, schedule for GYN exam, routine physical exam, seat belts, multiple vitamins, osteoporosis prevention, adequate calcium intake in diet, recommendations for immunizations, mammograms and Pap smears.  Discussed exercise and checking cholesterol.  Discussed gun safety, safe sex, and  contraception.  Complete Medication List: 1)  Mobic 7.5 Mg Tabs (Meloxicam) .... Two times a day 2)  Glucosamine Chondr 1500 Complx Caps (Glucosamine-chondroit-vit c-mn) .... Once daily 3)  Adult Aspirin Ec Low Strength 81 Mg Tbec (Aspirin) .... Once daily 4)  Caltrate 600 1500 Mg Tabs (Calcium carbonate) .... Once daily 5)  Qvar 80 Mcg/act Aers (Beclomethasone dipropionate) .... Two puff by mouth daily 6)  Zyrtec Allergy 10 Mg Tabs (Cetirizine hcl) .Marland Kitchen.. 1 at bedtime 7)  Cvs Saline Nasal Spray 0.65 % Soln (Saline) .... As needed  Lipid Assessment/Plan:      Based on NCEP/ATP III, the patient's risk factor category is "0-1 risk factors".  The patient's lipid goals are as follows: Total cholesterol goal is 200; LDL cholesterol goal is 160; HDL cholesterol goal is 40; Triglyceride goal is 150.     Patient Instructions: 1)  6 month   Preventive Care Screening  Mammogram:    Date:  04/13/2009    Next Due:  04/2010    Results:  normal   Pap Smear:    Date:  06/14/2009    Next Due:  06/2010    Results:  normal    Contraindications/Deferment of Procedures/Staging:    Test/Procedure: FLU VAX    Reason for deferment: patient declined

## 2010-11-14 NOTE — Progress Notes (Signed)
Summary: sinus or bronchitis  Phone Note Call from Patient Call back at Work Phone [phone]   Caller: vm Reason for Call: Acute Illness Summary of Call: Sinus infection or  Bronchitis.  Zyrtec 2 tabs.  All stopped up head.  In chest.   Coughing it up.  See me or med.  [facility]. Initial call taken by: Rudy Jew, RN,  March 17, 2010 9:44 AM  Follow-up for Phone Call        per dr Lovell Sheehan- z pack and atuss 2 tsp every 12hours 6oz Follow-up by: Willy Eddy, LPN,  March 17, 980 11:22 AM  Additional Follow-up for Phone Call Additional follow up Details #1::        Phone Call Completed Additional Follow-up by: Rudy Jew, RN,  March 17, 2010 11:44 AM    New/Updated Medications: ZITHROMAX Z-PAK 250 MG TABS (AZITHROMYCIN) As directed ATUSS DS 30-4-30 MG/5ML SUSP (PSEUDOEPHED HCL-CPM-DM HBR TAN) 2 teaspoons every 6 hours Prescriptions: ATUSS DS 30-4-30 MG/5ML SUSP (PSEUDOEPHED HCL-CPM-DM HBR TAN) 2 teaspoons every 6 hours  #6 ounces x 0   Entered by:   Rudy Jew, RN   Authorized by:   Stacie Glaze MD   Signed by:   Rudy Jew, RN on 03/17/2010   Method used:   Electronically to        [facility] (retail)       [address]       [place], [place]  [address]       Ph: [phone]       Fax: [phone]   RxID:   [id number] ZITHROMAX Z-PAK 250 MG TABS (AZITHROMYCIN) As directed  #1 x 0   Entered by:   Rudy Jew, RN   Authorized by:   Stacie Glaze MD   Signed by:   Rudy Jew, RN on 03/17/2010   Method used:   Electronically to        [facility] (retail)       [address]       [place], [place]  [address]       Ph: [phone]       Fax: [phone]   RxID:   [id number]

## 2010-11-14 NOTE — Progress Notes (Signed)
Summary: urinary  Phone Note Call from Patient   Summary of Call: Yesterday urge to urinate, pressure, dribbling, blood on wipe.  Gave specimen today at [facility] for CPX lab.  They said results available to you around 12 today.  Symptoms continue today.  Please see UA lab.  [facility].  NKDA.   Initial call taken by: Rudy Jew, RN,  January 31, 2010 11:52 AM    New/Updated Medications: CIPRO 500 MG TABS (CIPROFLOXACIN HCL) 1 two times a day for 5 days Prescriptions: CIPRO 500 MG TABS (CIPROFLOXACIN HCL) 1 two times a day for 5 days  #10 x 0   Entered by:   Willy Eddy, LPN   Authorized by:   Stacie Glaze MD   Signed by:   Willy Eddy, LPN on 13/24/4010   Method used:   Electronically to        [facility] (retail)       [address]       [place], [place]  [address]       Ph: [phone]       Fax: [phone]   RxID:   [id number]   Appended Document: urinary left message on machine for pt

## 2011-01-15 ENCOUNTER — Other Ambulatory Visit: Payer: Self-pay | Admitting: Internal Medicine

## 2011-02-04 IMAGING — US IR TRANSCATH EMBOLIZATION NON-NEURO EA OP FIELD
1 series · 8 of 8 positions shown · non-contrast
Comparison: none

CLINICAL DATA: Recurrent right G S V venous insufficiency, leg
pain, thigh varicosities

[Series 1: ir transcath embolization non-neuro ea op field · 0.06mm/px · 8 acquisitions, 8 frames shown]
[im 1/8]
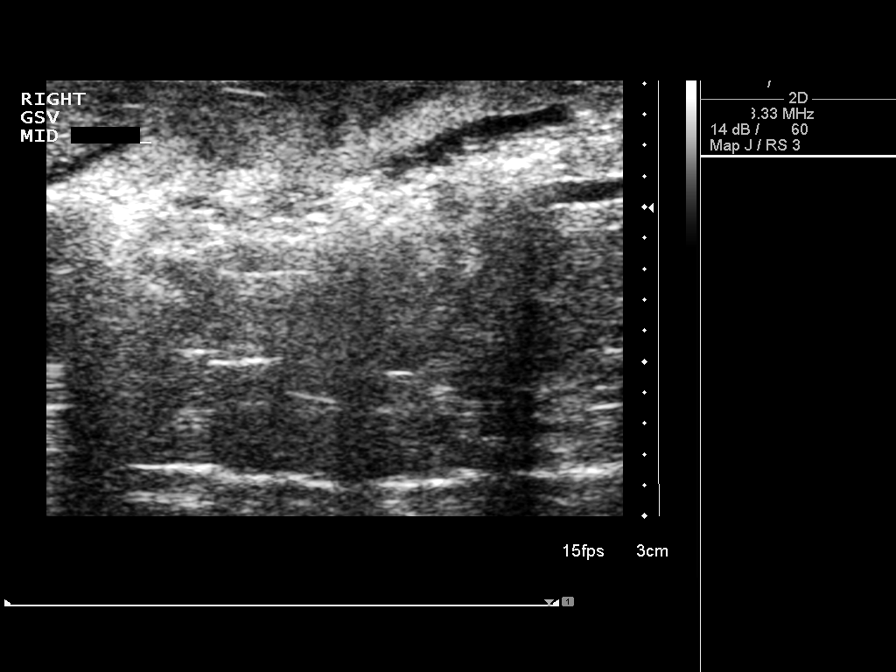
[im 2/8]
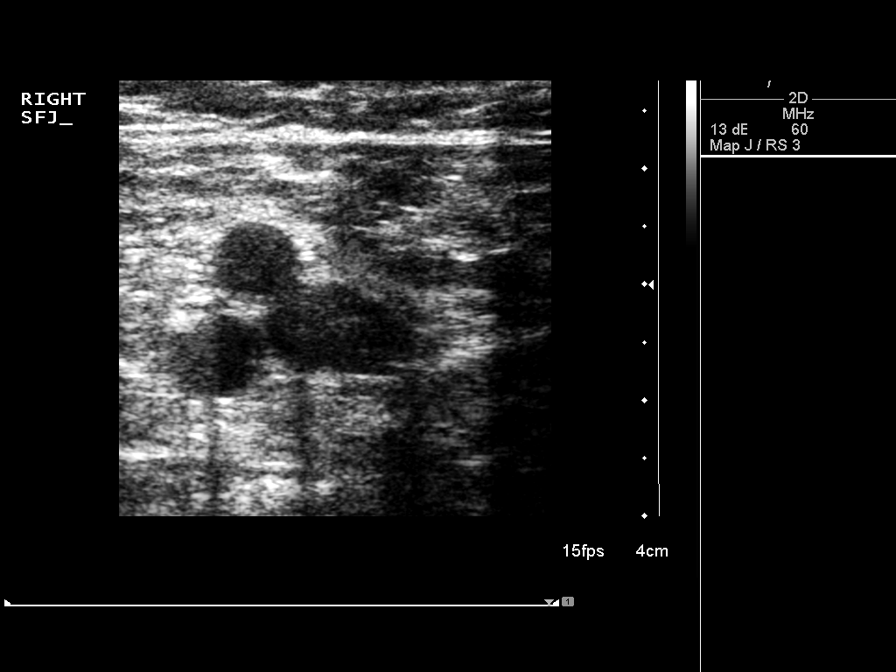
[im 3/8]
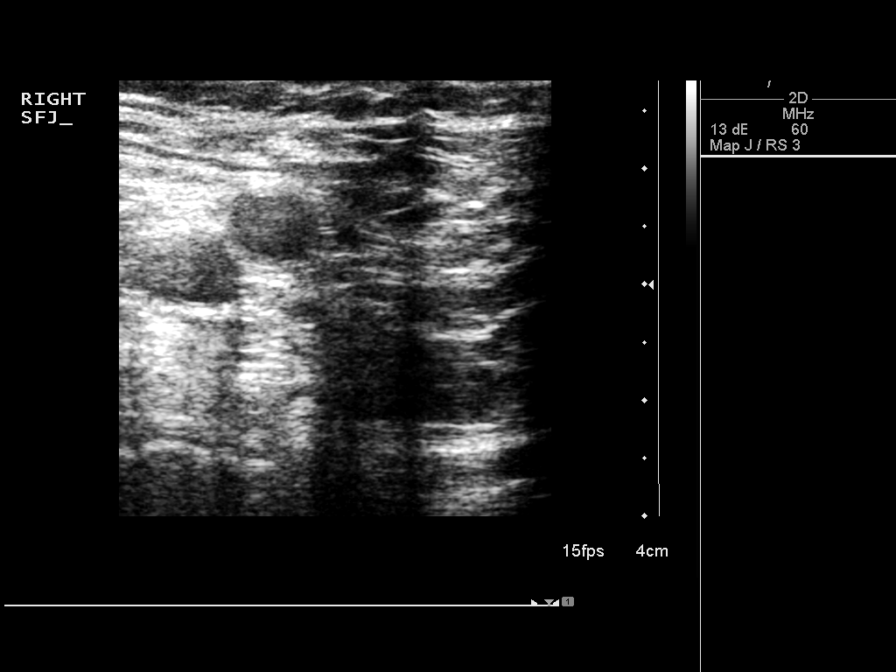
[im 4/8]
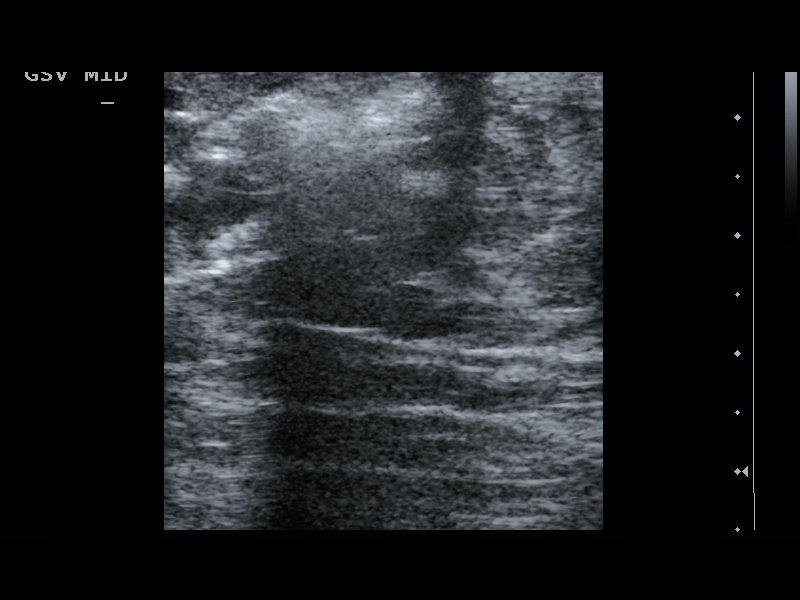
[im 5/8]
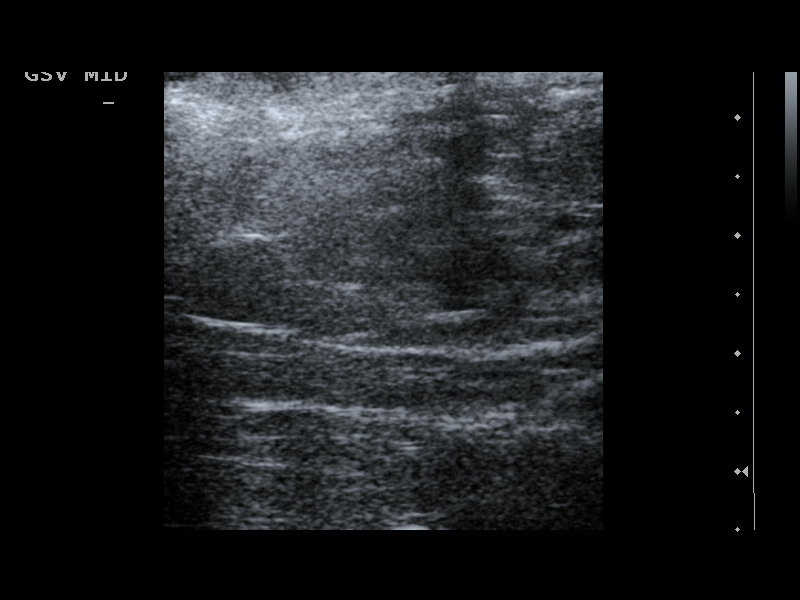
[im 6/8]
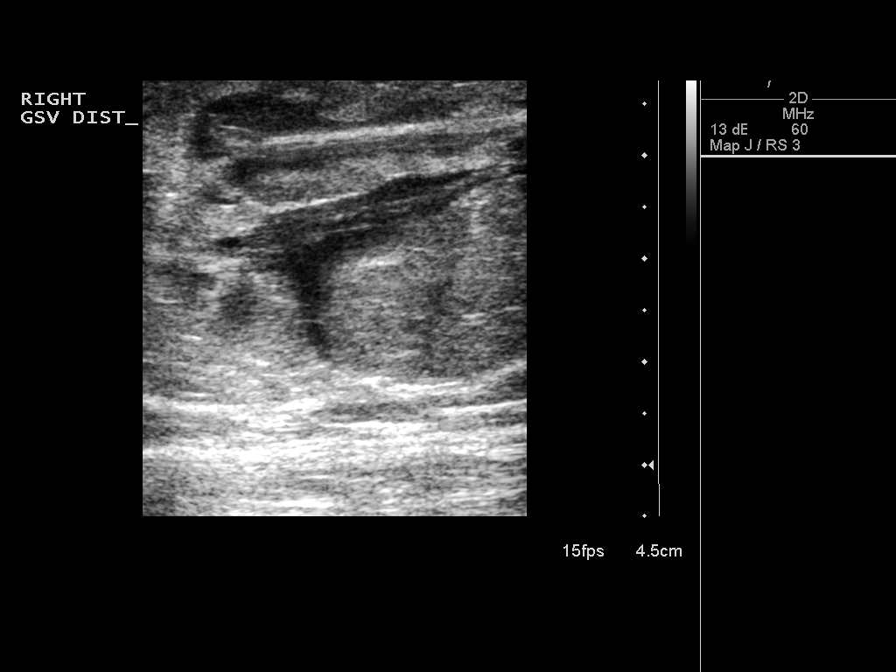
[im 7/8]
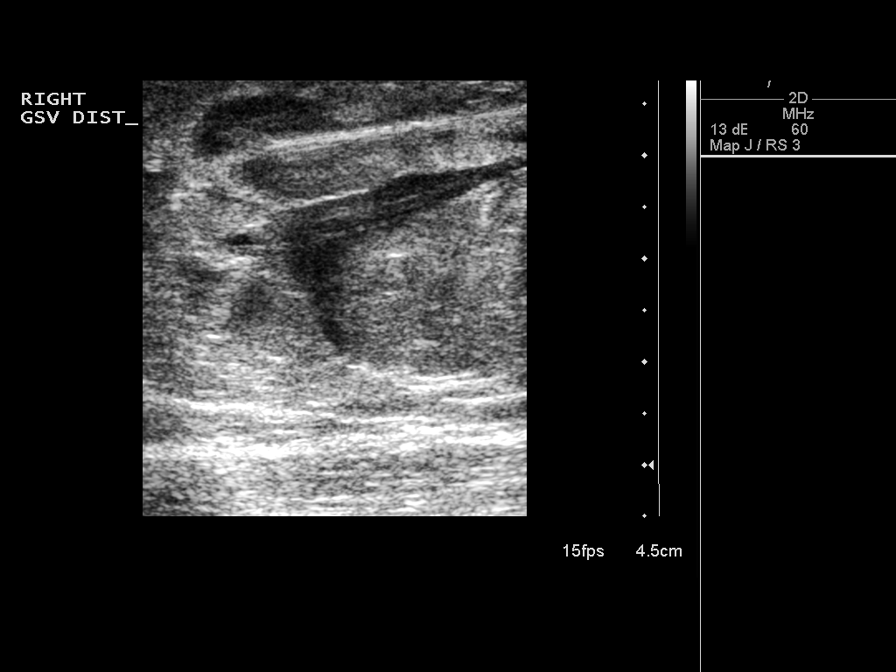
[im 8/8]
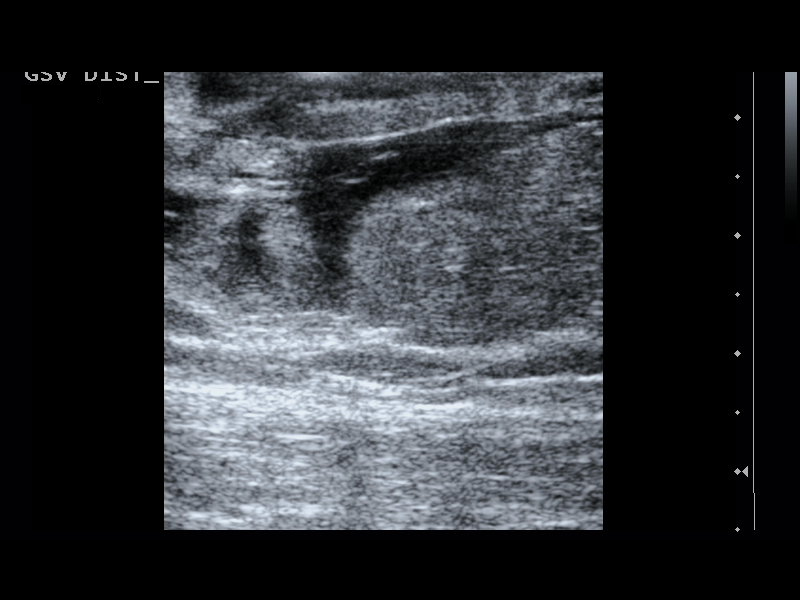

[8 of 8 positions shown; findings below may reference images not displayed]

ULTRASOUND RIGHT G S V TRANSCATHETER LASER OCCLUSION

Date:  03/15/2009 [DATE]

Radiologist:  Ambreen Watkins, M.D.

Medications:  200 ml diluted 1% lidocaine for local and tumescent
anesthesia

Guidance:  Ultrasound

Complications:  No immediate

PROCEDURE/FINDINGS:

Informed consent was obtained from the patient following
explanation of the procedure, risks, benefits and alternatives.
The patient understands, agrees and consents for the procedure.
All questions were addressed.  A time out was performed.

Maximal barrier sterile technique utilized including caps, mask,
sterile gowns, sterile gloves, large sterile drape, hand hygiene,
and betadine prep.

Preliminary venous mapping was performed of the tortuous
recanalized right G S V in the thigh region.  Right leg was
sterilely prepped and draped.  Under sterile conditions, initially
distal right G S V access was performed in the proximal calf.
Because of tortuosity the guide wire would not advance past the mid
thigh region.  In a similar fashion, a second access was performed
in the mid thigh.  This also would not advance centrally because of
tortuosity.  These 2 short segments are amenable to endovenous
laser therapy.

Initially the proximal 6 cm short segment was treated.  To perform
this, a guide wire was inserted followed by advancement of the 4-
French delivery sheath.  Position was confirmed in the mid thigh
region.  Laser fiber was advanced through the sheath but not yet
exposed to the native vein wall.  Under ultrasound guidance, 50 ml
of diluted 1% lidocaine was infiltrated for local anesthesia.
There was good infiltration, collapse and insulation around the
laser fiber and sheath.  Skin depth is  10 mm.  Laser fiber was
exposed to the native vein wall and reconfirmed to be within the
vein.  Images were obtained documentation.  This is well inferior
to the saphenofemoral junction in the mid thigh.  Laser fiber was
enabled at a power setting of 10 Miskin.  6 cm segment of the
recanalized right G S V was treated delivering 320 joules over 33
seconds.  Hemostasis was obtained with compression.

Next, the distal 12 cm segment was treated across the knee.  To
perform this, the guide wire was inserted allowing advancement of
the 4-French delivery sheath.  Position was confirmed in the lower
thigh.  150 ml of dilute 1% lidocaine was infiltrated for local
anesthesia.  There was adequate infiltration, insulation and
collapse.  Skin depth greater than 10 mm.  Laser fiber was exposed
to the native vein wall in the distal thigh.  At a power setting of
10 Miskin, 567 joules were delivered over 59 seconds to treat a 12
cm segment of the recanalized G S V.

Total treatment length was 18 cm of the mid to distal thigh
recanalized G S V.

Hemostasis within compression.  Survey of the region demonstrates
collapse of the surrounding distal thigh varicosities.  No
immediate complication. The patient tolerated procedure well.
IMPRESSION: Difficult ultrasound transcatheter laser occlusion of a recanalized
right G S V, as described.

Plan:  Follow-up 1 week.  Any residual varicosities will be treated
with sclerotherapy.

## 2011-02-11 IMAGING — US EM EST PATIENT OFFICE LEVEL 3 (15 MIN)
1 series · 13 of 16 positions shown · non-contrast
Comparison: 03/15/2009

CLINICAL DATA: 1 week status post right G S V transcatheter laser
occlusion, varicose veins, leg pain, edema

ESTABLISHED PATIENT OFFICE VISIT [id number]

[Series 1: em est patient office level 3 (15 min) · 13 of 24 slices shown]
[im 1/24]
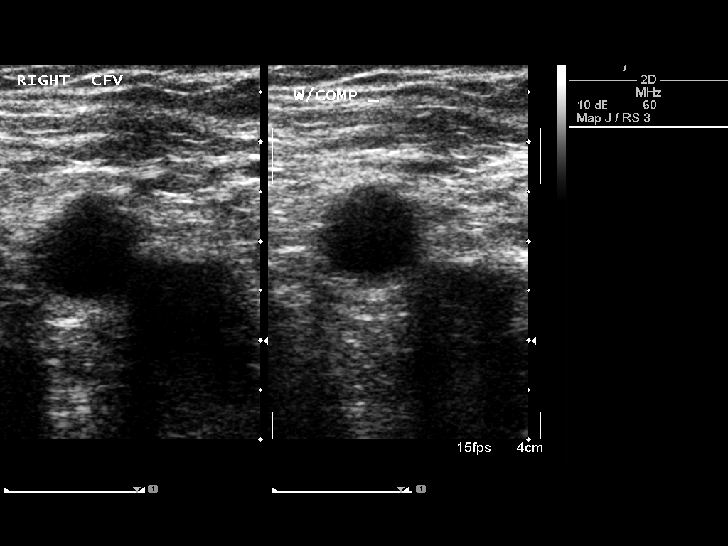
[im 2/24]
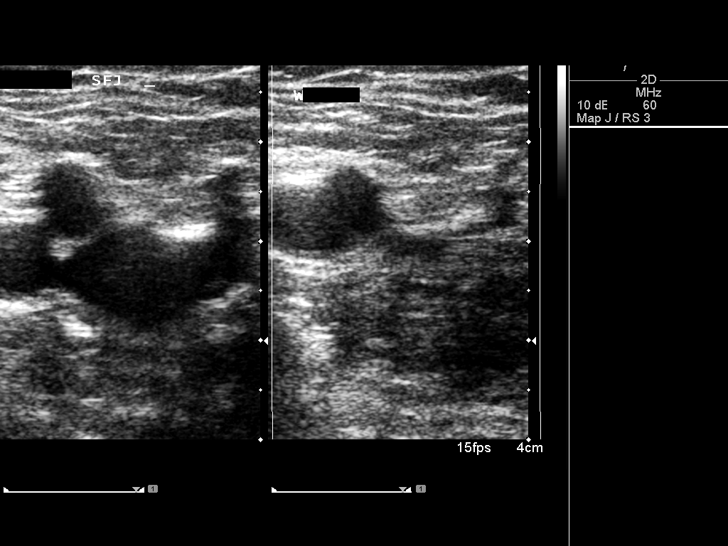
[im 5/24]
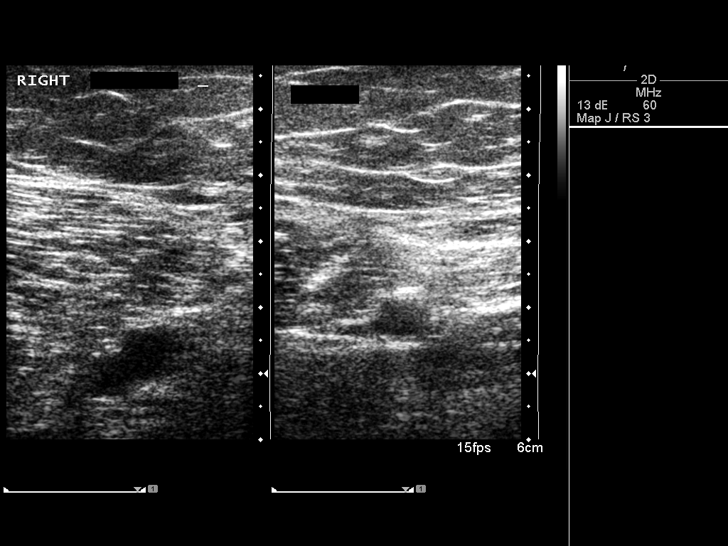
[im 7/24]
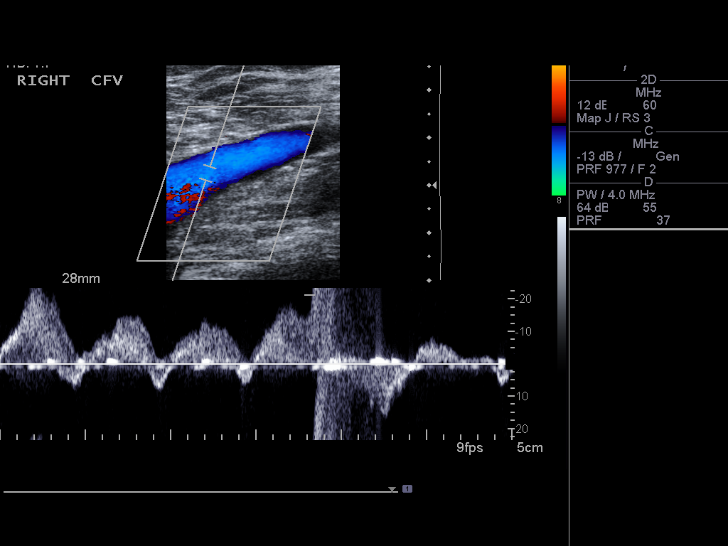
[im 8/24]
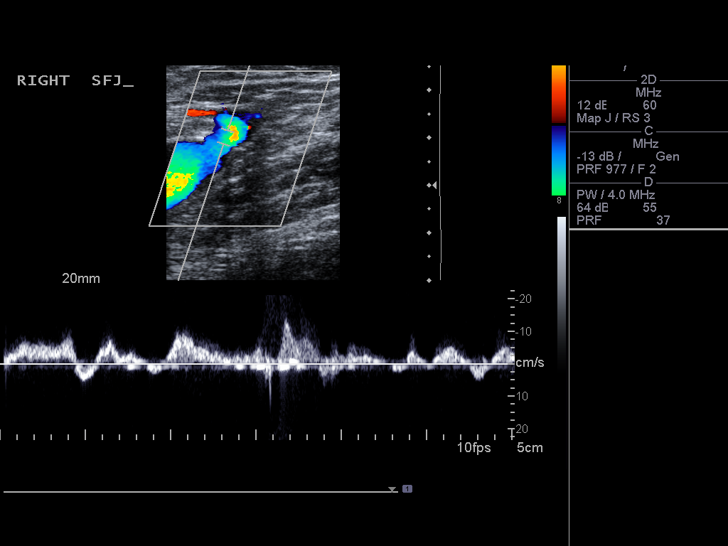
[im 10/24]
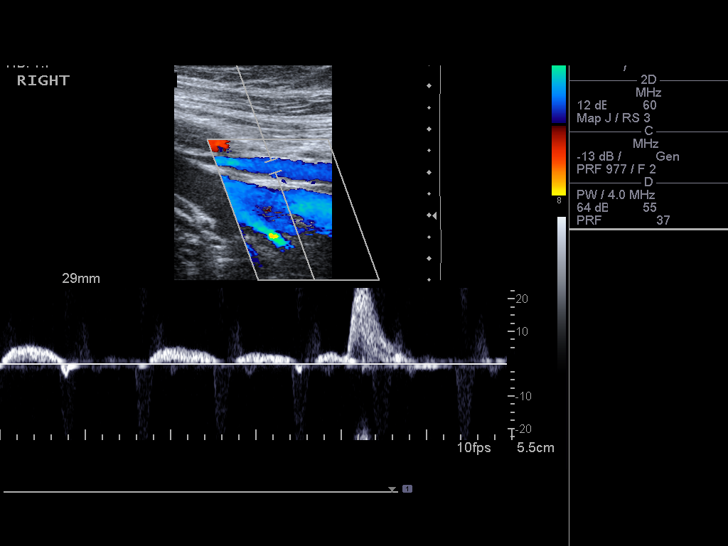
[im 13/24]
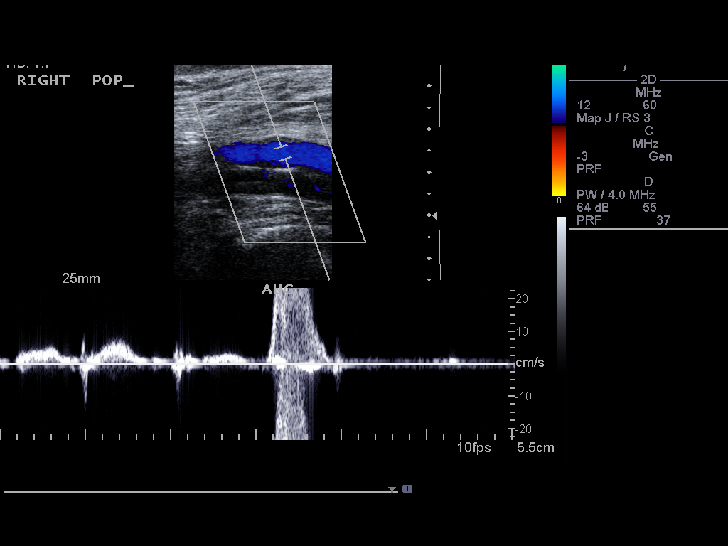
[im 14/24]
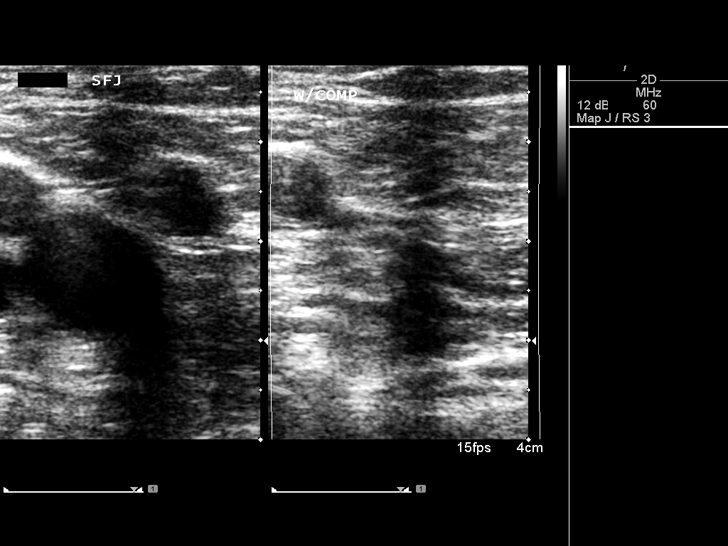
[im 16/24]
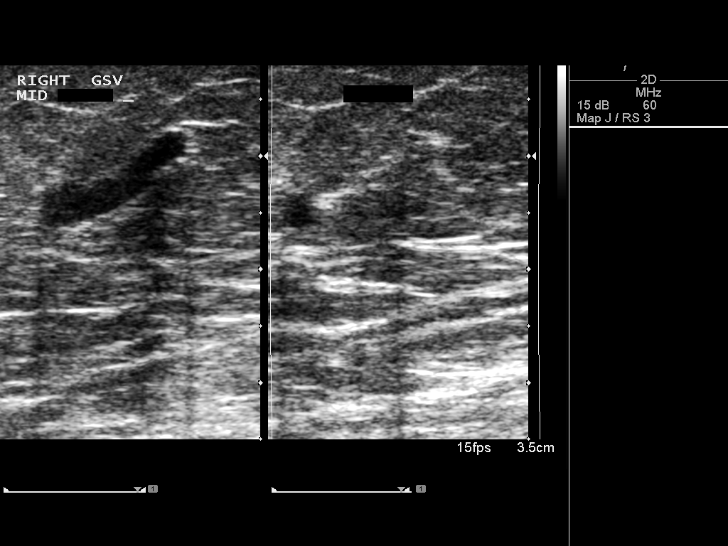
[im 17/24]
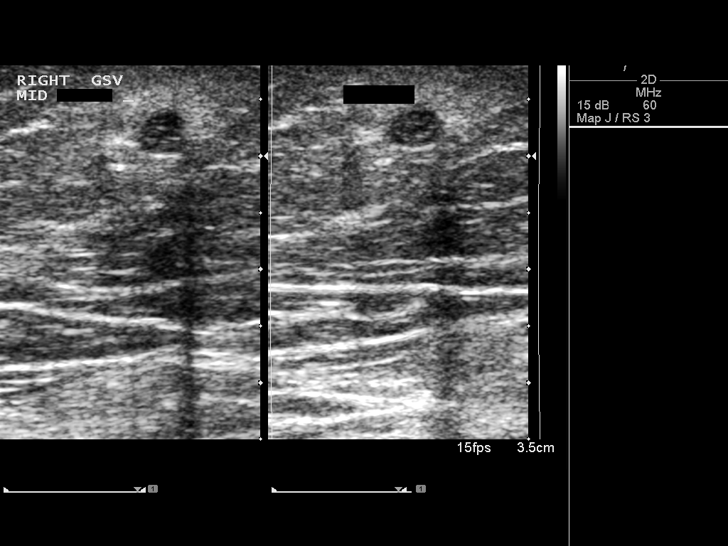
[im 19/24]
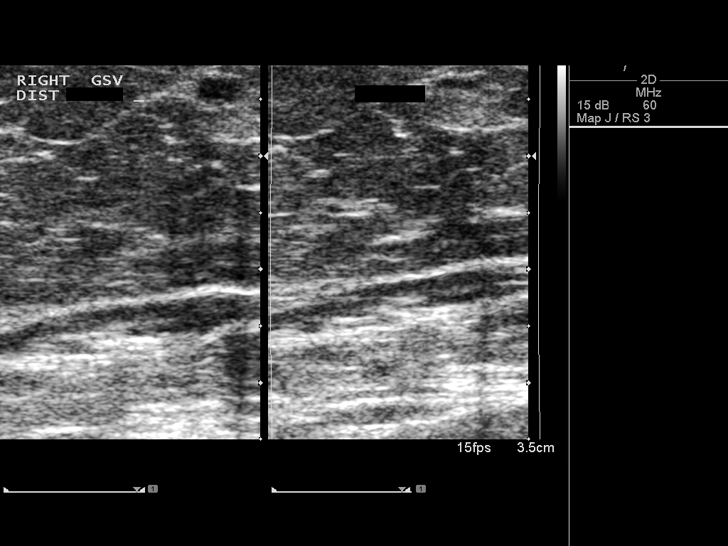
[im 22/24]
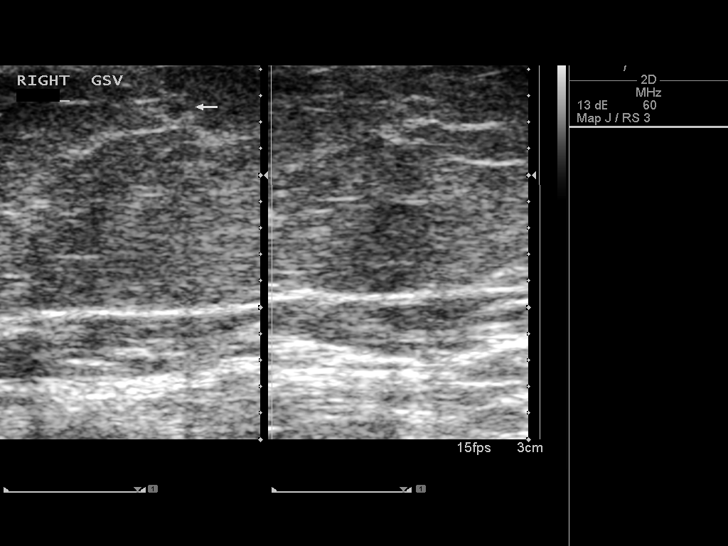
[im 24/24]
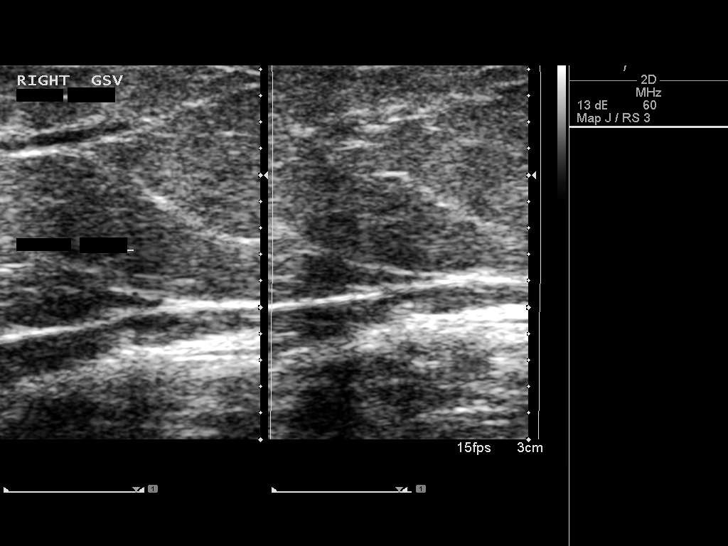

[13 of 16 positions shown; findings below may reference images not displayed]

Interval history:  63-year-old female status post repeat treatment
of the right G S V with laser ablation 1 week ago.  She returns for
outpatient follow-up and ultrasound.  Over the last week, she has
recovered at home very well.  She has been compliant with
compression stockings and daily walking.  No interval fevers or
significant leg pain.  She reports bruising along the treated
segment of the inner thigh and knee.  Overall she is doing very
well.

Focus exam:  Moderate bruising along the inner thigh and knee.  No
palpable hematoma or mass.  Intact skin.  Entry sites are healed.
No drainage or sign of infection.

Ultrasound:  This was dictated separately.  Briefly, the treated
segments of the right G S V in the mid thigh extending across the
knee are occluded.  Residual thigh varicosities remain patent
decompressed.  Negative for DVT.

Assessment:  1 week status post repeat right G S V transcatheter
laser occlusion for recanalization, recurrent varicosities,
progressive leg pain and paresthesias.  Overall recovering well.

Plan:  Residual thigh varicosities will be treated with ultrasound
foam sclerotherapy in approximately 7 weeks.  This will allow the
moderate bruising to resorb.

## 2011-02-27 NOTE — Op Note (Signed)
NAMEJOLANA, [name]                  ACCOUNT NO.:  [id number]   MEDICAL RECORD NO.:  [id number]          PATIENT TYPE:  AMB   LOCATION:  SDS                          FACILITY:  [facility]   PHYSICIAN:  [name], M.D.    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   PROCEDURE:  Right L3, L4 and L5 decompressive laminotomy with resection  of epidural mass, microdissection.   SURGEON:  Kathaleen Maser. Graves, M.D.   ASSISTANT:  Tia Alert, M.D.   ANESTHESIA:  General endotracheal anesthesia.   INDICATIONS:  [name] is a 65 year old female with a history of severe  right lower extremity pain.  She has weakness consistent with a right  L3, L4 and L5 radiculopathy.  Workup demonstrates evidence of a very  large extradural mass dorsally off to the right side with evidence of  hemorrhage within it, most likely consistent with hemorrhagic synovial  cyst, but neoplasm could be ruled out.  The patient has been counseled  as to her options.  She decided to proceed with a hemilaminectomy and  resection of this mass.   OPERATIVE NOTE:  The patient was brought to the operating room and  placed on the operating table in the supine position.  After an adequate  level of anesthesia was achieved, the patient was positioned prone onto  the Wilson frame and appropriately padded.  The patient's lumbar region  was prepped and draped sterilely.  A 10 blade was used to make a linear  skin incision overlying the L3, L4 and L5 levels.  This was carried down  sharply and subperiosteal dissection performed exposing the lamina and  facet joints of L3, L4 and L5.  Deep self-retaining retractor was  placed.  An x-ray was taken and the level was confirmed.   Right sided  hemilaminectomy was then performed using a high speed drill  and Kerrison rongeurs to remove the inferior 1/2 of the lamina of L3,  the entire right hemilamina of L4, and the superior aspect of the lamina  at L5.  Partial medial facetectomies were performed at L3-L4 and L4-L5  on the right.  Ligamentum flavum was then elevated using a blunt nerve  hook and resected using Kerrison rongeurs.  The microscope was then  brought on the field and using microdissection, the epidural mass was  encountered.  This was gently dissected free from the underlying thecal  sac.  The mass was then resected in a piecemeal fashion.  Intraoperative  findings were consistent with a synovial cyst with blood products within  it.  Specimens were sent to the lab for permanent microscopic  evaluation, however.   Decompression then proceeded inferiorly.  The entire cystic mass was  resected fully decompressing the thecal sac and right L3, L4, and L5  nerve roots.  There was no evidence of injury  to the thecal sac and  nerve roots and there was no evidence of residual mass once this had  been performed.  Epidural venous plexus was coagulated.  The wound was  then irrigated with antibiotic solution.  Gelfoam was placed topically  for hemostasis which was found to be good.  The microscope and retractor  system were removed.  Hemostasis of the muscles was obtained using  electrocautery.  The wound was then closed in layers with Vicryl  sutures.  Steri-Strips and sterile dressings were applied.  There were  no complications.  The patient tolerated the procedure well and she  returns to the recovery room for postoperative care.           ______________________________  Kathaleen Maser. Graves, M.D.     HAP/MEDQ  D:  05/09/2007  T:  05/10/2007  Job:  [id number]

## 2011-03-02 NOTE — Op Note (Signed)
NAMELOLETHA, BERTINI                  ACCOUNT NO.:  [id number]   MEDICAL RECORD NO.:  [id number]          PATIENT TYPE:  AMB   LOCATION:  [facility]                         FACILITY:  [facility]   PHYSICIAN:  Deidre Ala, M.D.    DATE OF BIRTH:  1946/04/03   DATE OF PROCEDURE:  03/12/2006  DATE OF DISCHARGE:                                 OPERATIVE REPORT   PREOPERATIVE DIAGNOSES:  1.  Degenerative medial meniscal tear.  2.  Tricompartment osteoarthritis.  3.  Plicas.  4.  Degenerative medial and lateral meniscal tears, right knee.   POSTOPERATIVE DIAGNOSES:  1.  Right knee degenerative medial and lateral meniscus tears.  2.  Tricompartment grade 3 chondromalacia and degenerative joint disease.  3.  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  4.  Tricompartment synovitis with plicas.   PROCEDURE:  1.  Right knee operative arthroscopy with partial medial and lateral      meniscectomies.  2.  Abrasion and ablation chondroplasty of tricompartment.  3.  Arthroscopic lateral retinacular release.  4.  Tricompartment plica excisions and synovectomy.   SURGEON:  Doristine Section, M.D.   ASSISTANT:  Clarene Reamer, P.A.-C.   ANESTHESIA:  General with LMA.   CULTURES:  None.   DRAINS:  None.   ESTIMATED BLOOD LOSS:  Minimal.   TOURNIQUET TIME:  Thirty-seven minutes.   PATHOLOGIC FINDINGS AND HISTORY:  [name] is a 65 year old patient of mine I  have been following for 3 years with bilateral knee osteoarthritis.  She was  recently sent in by Dr. Darryll Capers for increasing pain in her right knee  with medial joint line pain.  We got an MRI scan of her knee which showed  degenerative medial meniscus tears, significant medial compartment  osteoarthritis and chondromalacia.  There was a large septated loculated  medial popliteal cyst, but it was not really symptomatic.  We talked about  knee arthroscopy with debridement and also that we would not address the  cyst at this  point, but if it became problematic later we would and our hope  was that after debriding the knee, that it would go down.  We also talked  about postoperative __________  shoe parts, given the amount of  osteoarthritis in her knee.  At surgery, she did have significant  chondromalacia and osteoarthritis, grade 3, to near bone on the entire  medial femoral condyle, 1/2 of the medial tibial plateau, the lateral  femoral condyle, the lateral tibial plateau inner rim near the tibial spines  and the trochlea, half-dollar to quarter-size, and about 60% of the  posterior patella.  She had intense synovitis medially and laterally and  superiorly with plicas.  She had a tight lateral retinaculum.  She had a  degenerative stellate complex posterior two-thirds medial meniscus tear and  marked degenerative fraying of the lateral meniscus.  All of this was  thoroughly smooth, debrided and ablated with a shaver and a bladder on one,  using baskets to saucerize the menisci.  Lateral retinacular release was  carried out.   DESCRIPTION OF PROCEDURE:  With adequate anesthesia obtained using LMA  technique, 1 g of Ancef given IV prophylaxis, the patient was placed in the  supine position.  The right lower extremity was prepped from the malleoli to  the leg holder in the standard fashion.  After standard prepping and  draping, Esmarch exsanguination of used.  The tourniquet was let up to 375  mmHg.  After standard prepping and draping, a superolateral inflow portal  was made.  The knee was insufflated with normal saline with the arthroscopic  pump.  Medial and lateral joint portals were then made and the joint was  thoroughly inspected.  I then shaved out the medial plica back to the  sidewall and launched the medial band.  I then shaved the trochlea and  posterior patella and used the ablator on 1 to smooth.  I then isolated the  medial meniscal tear and the medial femoral condyle chondromalacia and   shaved it with a shaver using basket to saucerize the medial meniscus to a  stable rim.  I then brought in the ablator to smooth the medial tibial  plateau, medial femoral condyle and the inner rim of the medial meniscus  resected edge.  I then reversed portals and used baskets and shaver on the  lateral meniscus and used the ablator to smooth, as well as the lateral  tibial plateau.  I then shaved out the lateral plica and some gutter  synovitis.  I then further smoothed the posterior patella.  I then did an  arthroscopic lateral release from the vastus lateralis to the joint line  with good improvement of tilt, track and pressurization.  Superior pouch  synovitis was also removed.  I then irrigated the knee through the scope.  Marcaine 0.5% with morphine was injected into the joint.  A bulky sterile  compressive dressing was applied with a lateral foam pad for tamponade and  EZ-Wrap placed.  The patient then having tolerated the procedure well was  awakened and taken to the recovery room in satisfactory condition, given  Percocet for pain and told to call the office in the morning for an  appointment for recheck tomorrow.           ______________________________  V. Charlesetta Shanks, M.D.     VEP/MEDQ  D:  03/12/2006  T:  03/12/2006  Job:  [id number]   cc:   Stacie Glaze, M.D. [facility]  [address] [place]  [place] [address]

## 2011-03-05 ENCOUNTER — Encounter: Payer: Self-pay | Admitting: Family Medicine

## 2011-03-05 ENCOUNTER — Ambulatory Visit (INDEPENDENT_AMBULATORY_CARE_PROVIDER_SITE_OTHER): Payer: BC Managed Care – PPO | Admitting: Family Medicine

## 2011-03-05 DIAGNOSIS — R0602 Shortness of breath: Secondary | ICD-10-CM

## 2011-03-05 DIAGNOSIS — R002 Palpitations: Secondary | ICD-10-CM

## 2011-03-05 NOTE — Patient Instructions (Signed)
No heavy exertion activities until further evaluated. Continue with aspirin one daily. Follow up immediately if you have any recurrent/progressive chest pains.

## 2011-03-05 NOTE — Progress Notes (Signed)
  Subjective:    Patient ID: [name], female    DOB: [birth year], 65 y.o.   MRN: [id number]  HPI Patient seen with sensation of heart racing intermittently for the past 6 months. Episodes generally occur about every 2-3 weeks. No clear triggering factors. Sense of irregularity and recently took pulse of 85 during episode. No syncope. Was not having any associated chest pain until last week. She was doing yard work and felt substernal chest pressure which was mild in intensity. Some associated mild shortness of breath. Lasted about 4 hours and improved with rest. Similar episode while walking to mail box yesterday. None since then. No left arm or neck discomfort. Denies any nausea, vomiting, or diaphoresis.  No significant caffeine use. Denies specific stressors.  Patient had adenosine stress test 2007 with no ischemia. Nonsmoker. No history of diabetes. Mild hyperlipidemia. No history of hypertension. Family history of coronary disease father and sister. Patient takes baby aspirin one daily.  Past Medical History  Diagnosis Date  . SHINGLES 11/08/2008  . HYPERLIPIDEMIA, BORDERLINE 02/09/2009  . Obesity, unspecified 08/06/2008  . VARICOSE VEINS LOWER EXTREMITIES W/INFLAMMATION 02/05/2008  . ASTHMATIC BRONCHITIS, ACUTE 01/29/2008  . ASTHMA UNSPECIFIED WITH EXACERBATION 11/08/2008  . OSTEOARTHRITIS 04/24/2007  . OSTEOPOROSIS 04/24/2007   Past Surgical History  Procedure Date  . Rotator cuff repair   . Knee arthroscopy   . Spine surgery     lumbar laminectomy  . Spine surgery 2008    lumbar fusion    reports that she has never smoked. She does not have any smokeless tobacco history on file. Her alcohol and drug histories not on file. family history includes Cancer in her mother and other and Hypertension in her other. No Known Allergies   Review of Systems  Constitutional: Negative for fever, chills, activity change, appetite change and unexpected weight change.  Eyes: Negative for visual  disturbance.  Respiratory: Positive for shortness of breath. Negative for cough.   Cardiovascular: Positive for chest pain and palpitations. Negative for leg swelling.  Gastrointestinal: Negative for abdominal pain.  Neurological: Negative for dizziness, syncope and weakness.  Hematological: Negative for adenopathy. Does not bruise/bleed easily.  Psychiatric/Behavioral: Negative for dysphoric mood.       Objective:   Physical Exam  Constitutional: She is oriented to person, place, and time. She appears well-developed and well-nourished.  HENT:  Mouth/Throat: Oropharynx is clear and moist. No oropharyngeal exudate.  Neck: Neck supple. No thyromegaly present.  Cardiovascular: Normal rate, regular rhythm and normal heart sounds.   Pulmonary/Chest: Effort normal and breath sounds normal. No respiratory distress. She has no wheezes. She has no rales.  Abdominal: Soft. There is no tenderness.  Musculoskeletal: She exhibits no edema.  Lymphadenopathy:    She has no cervical adenopathy.  Neurological: She is alert and oriented to person, place, and time.  Skin: No rash noted.          Assessment & Plan:  Patient presents with several month history of intermittent palpitations and sensation of irregular heartbeat. Recent new symptom of some mild exertion-related chest pressure and dyspnea. EKG no acute change or finding. Patient needs further evaluation. Cardiology referral. Continue aspirin one daily. No heavy exertional activities until further evaluated

## 2011-03-08 ENCOUNTER — Encounter: Payer: Self-pay | Admitting: Cardiovascular Disease

## 2011-03-08 ENCOUNTER — Ambulatory Visit: Payer: BC Managed Care – PPO | Admitting: Cardiovascular Disease

## 2011-03-14 LAB — HM DEXA SCAN: HM Dexa Scan: NORMAL

## 2011-03-15 ENCOUNTER — Ambulatory Visit: Payer: BC Managed Care – PPO | Admitting: Cardiovascular Disease

## 2011-03-15 ENCOUNTER — Ambulatory Visit (INDEPENDENT_AMBULATORY_CARE_PROVIDER_SITE_OTHER): Payer: BC Managed Care – PPO | Admitting: Cardiovascular Disease

## 2011-03-15 ENCOUNTER — Encounter: Payer: Self-pay | Admitting: Cardiovascular Disease

## 2011-03-15 DIAGNOSIS — R079 Chest pain, unspecified: Secondary | ICD-10-CM

## 2011-03-15 DIAGNOSIS — E785 Hyperlipidemia, unspecified: Secondary | ICD-10-CM

## 2011-03-15 DIAGNOSIS — R002 Palpitations: Secondary | ICD-10-CM

## 2011-03-15 DIAGNOSIS — J45909 Unspecified asthma, uncomplicated: Secondary | ICD-10-CM

## 2011-03-15 NOTE — Patient Instructions (Signed)
Your physician recommends that you schedule a follow-up appointment in: AS NEEDED  Your physician recommends that you continue on your current medications as directed. Please refer to the Current Medication list given to you today.  Your physician has requested that you have a lexiscan myoview. For further information please visit [email or web address] Please follow instruction sheet, as given.  DX CHEST PAIN AT PT'S CONVENIENCE

## 2011-03-15 NOTE — Progress Notes (Signed)
Referred by Dr Lovell Sheehan Patient seen with sensation of heart racing intermittently for the past 6 months. Episodes generally occur about every 2-3 weeks. No clear triggering factors. Sense of irregularity and recently took pulse of 85 during episode. No syncope. Was not having any associated chest pain until last week. She was doing yard work and felt substernal chest pressure which was mild in intensity. Some associated mild shortness of breath. Lasted about 4 hours and improved with rest. Similar episode while walking to mail box 10 days ago. None since then. No left arm or neck discomfort. Denies any nausea, vomiting, or diaphoresis. No significant caffeine use. Denies specific stressors.  Patient had adenosine stress test 2007 with no ischemia. Nonsmoker. No history of diabetes. Mild hyperlipidemia. No history of hypertension. Family history of coronary disease father and sister. Patient takes baby aspirin one daily. Has asthma but not acute exacerbations and using Qvair.  Right knee problems would preclude walking on treadmill.  Discussed need for lexiscan myovue and patient agreeable.    ROS: Denies fever, malais, weight loss, blurry vision, decreased visual acuity, cough, sputum, SOB, hemoptysis, pleuritic pain, palpitaitons, heartburn, abdominal pain, melena, lower extremity edema, claudication, or rash.  All other systems reviewed and negative   General: Affect appropriate Healthy:  appears stated age HEENT: normal Neck supple with no adenopathy JVP normal no bruits no thyromegaly Lungs clear with no wheezing and good diaphragmatic motion Heart:  S1/S2 no murmur,rub, gallop or click PMI normal Abdomen: benighn, BS positve, no tenderness, no AAA no bruit.  No HSM or HJR Distal pulses intact with no bruits No edema Neuro non-focal Skin warm and dry No muscular weakness  Medications Current Outpatient Prescriptions  Medication Sig Dispense Refill  . aspirin 81 MG EC tablet Take 81 mg  by mouth daily.        . beclomethasone (QVAR) 80 MCG/ACT inhaler Inhale 1 puff into the lungs 2 (two) times daily.        . Calcium Carbonate (CALTRATE 600) 1500 MG TABS Take by mouth daily.        . cetirizine (ZYRTEC ALLERGY) 10 MG tablet Take 10 mg by mouth at bedtime.        . fish oil-omega-3 fatty acids 1000 MG capsule Take 2 g by mouth daily.        . Glucosamine-Chondroit-Vit C-Mn (GLUCOSAMINE CHONDR 1500 COMPLX) CAPS Take by mouth daily.        . meloxicam (MOBIC) 7.5 MG tablet TAKE ONE TABLET BY MOUTH TWICE DAILY  180 tablet  3  . NON FORMULARY [employer] Saline Nasal Spray .65% Soln (Saline)       . DISCONTD: azithromycin (ZITHROMAX Z-PAK) 250 MG tablet Take 2 tablets by mouth on day 1, followed by 1 tablet by mouth daily for 4 days. Use as directed       . DISCONTD: Pseudoephed HCl-CPM-DM HBr Tan (ATUSS DS) 30-4-30 MG/5ML SUSP Take by mouth. 2 teaspoons every 6 hours          Allergies Review of patient's allergies indicates no known allergies.  Family History: Family History  Problem Relation Age of Onset  . Cancer Mother     breast  . Hypertension Other   . Cancer Other     melanoma    Social History: History   Social History  . Marital Status: Married    Spouse Name: N/A    Number of Children: N/A  . Years of Education: N/A   Occupational History  .  Not on file.   Social History Main Topics  . Smoking status: Never Smoker   . Smokeless tobacco: Not on file  . Alcohol Use: Not on file  . Drug Use: Not on file  . Sexually Active: Not on file   Other Topics Concern  . Not on file   Social History Narrative  . No narrative on file    Electrocardiogram:5/21 NSR normal ECG  Assessment and Plan

## 2011-03-15 NOTE — Assessment & Plan Note (Signed)
Atypical.  ECG ok  F/U lexiscan myovue

## 2011-03-15 NOTE — Assessment & Plan Note (Signed)
Cholesterol is at goal.  Continue current dose of statin and diet Rx.  No myalgias or side effects.  F/U  LFT's in 6 months. Lab Results  Component Value Date   LDLCALC 118* 01/29/2008

## 2011-03-15 NOTE — Assessment & Plan Note (Signed)
Benign No need for further f/u

## 2011-03-15 NOTE — Assessment & Plan Note (Signed)
Continue Qvair.  No wheezing on current exam.  Not bad enough to preclude use of Lexiscan

## 2011-03-19 ENCOUNTER — Other Ambulatory Visit: Payer: Self-pay | Admitting: Cardiovascular Disease

## 2011-03-26 ENCOUNTER — Other Ambulatory Visit (HOSPITAL_COMMUNITY): Payer: BC Managed Care – PPO | Admitting: Radiology

## 2011-03-27 ENCOUNTER — Ambulatory Visit (HOSPITAL_COMMUNITY): Payer: BC Managed Care – PPO | Attending: Cardiovascular Disease | Admitting: Radiology

## 2011-03-27 DIAGNOSIS — R0789 Other chest pain: Secondary | ICD-10-CM

## 2011-03-27 DIAGNOSIS — R079 Chest pain, unspecified: Secondary | ICD-10-CM | POA: Insufficient documentation

## 2011-03-27 DIAGNOSIS — R0602 Shortness of breath: Secondary | ICD-10-CM

## 2011-03-27 MED ORDER — REGADENOSON 0.4 MG/5ML IV SOLN
0.4000 mg | Freq: Once | INTRAVENOUS | Status: AC
  Administered 2011-03-27: 0.4 mg via INTRAVENOUS

## 2011-03-27 MED ORDER — TECHNETIUM TC 99M TETROFOSMIN IV KIT
33.0000 | PACK | Freq: Once | INTRAVENOUS | Status: AC | PRN
  Administered 2011-03-27: 33 via INTRAVENOUS

## 2011-03-27 MED ORDER — TECHNETIUM TC 99M TETROFOSMIN IV KIT
10.5000 | PACK | Freq: Once | INTRAVENOUS | Status: AC | PRN
  Administered 2011-03-27: 11 via INTRAVENOUS

## 2011-03-27 NOTE — Progress Notes (Signed)
[facility] SITE 3 NUCLEAR MED [address] [place] [address] [phone]  Cardiology Nuclear Med Study  [name] is a 65 y.o. female [id number] 10-06-1946   Nuclear Med Background Indication for Stress Test:  Evaluation for Ischemia History:05/07 Myocardial Perfusion Study Cardiac Risk Factors: Carotid Disease, Family History - CAD and Lipids  Symptoms:  Chest Pain, Chest Pressure, Palpitations, Rapid HR and SOB   Nuclear Pre-Procedure Caffeine/Decaff Intake:  None NPO After: 11:30pm   Lungs:  clear IV 0.9% NS with Angio Cath:  18g  IV Site: R Antecubital  IV Started by:  Irean Hong, RN  Chest Size (in):  42 Cup Size: D  Height: 5' 4.5" (1.638 m)  Weight:  220 lb (99.791 kg)  BMI:  Body mass index is 37.18 kg/(m^2). Tech Comments:  N/A    Nuclear Med Study 1 or 2 day study: 1 day  Stress Test Type:  Eugenie Birks  Reading MD: Willa Rough, MD  Order Authorizing Provider:  P.Nishan  Resting Radionuclide: Technetium 58m Tetrofosmin  Resting Radionuclide Dose: 10.5 mCi   Stress Radionuclide:  Technetium 58m Tetrofosmin  Stress Radionuclide Dose: 33 mCi           Stress Protocol Rest HR: 68 Stress HR: 101  Rest BP: 115/76 Stress BP:124/66  Exercise Time (min): n/a METS: n/a   Predicted Max HR: 156 bpm % Max HR: 64.74 bpm Rate Pressure Product: 21308   Dose of Adenosine (mg):  n/a Dose of Lexiscan: 0.4 mg  Dose of Atropine (mg): n/a Dose of Dobutamine: n/a mcg/kg/min (at max HR)  Stress Test Technologist: Milana Na, EMT-P  Nuclear Technologist:  Domenic Polite, CNMT     Rest Procedure:  Myocardial perfusion imaging was performed at rest 45 minutes following the intravenous administration of Technetium 53m Tetrofosmin. Rest ECG: NSR  Stress Procedure:  The patient received IV Lexiscan 0.4 mg over 15-seconds.  Technetium 46m Tetrofosmin injected at 30-seconds.  There were no significant changes with Lexiscan.  Quantitative spect  images were obtained after a 45 minute delay. Stress ECG: No significant change from baseline ECG  QPS Raw Data Images:  Normal; no motion artifact; normal heart/lung ratio. Stress Images:  Normal homogeneous uptake in all areas of the myocardium. Rest Images:  Normal homogeneous uptake in all areas of the myocardium. Subtraction (SDS):  No evidence of ischemia. Transient Ischemic Dilatation (Normal <1.22):.98 Lung/Heart Ratio (Normal <0.45):  .32  Quantitative Gated Spect Images QGS EDV:  63 ml QGS ESV:  16 ml QGS cine images:  Normal Wall Motion QGS EF: 75%  Impression Exercise Capacity:  Lexiscan with no exercise. BP Response:  Normal blood pressure response. Clinical Symptoms:  Felt Hot ECG Impression:  No significant ST segment change suggestive of ischemia. Comparison with Prior Nuclear Study: No images to compare  Overall Impression:  Normal stress nuclear study.    Willa Rough

## 2011-03-28 NOTE — Progress Notes (Signed)
Copy to Dr. Eden Emms.Scarlette Ar

## 2011-03-30 NOTE — Progress Notes (Signed)
pt aware of results [name]  

## 2011-04-10 ENCOUNTER — Encounter: Payer: BC Managed Care – PPO | Admitting: Obstetrics and Gynecology

## 2011-04-12 ENCOUNTER — Other Ambulatory Visit: Payer: Self-pay | Admitting: Obstetrics and Gynecology

## 2011-04-12 ENCOUNTER — Other Ambulatory Visit (HOSPITAL_COMMUNITY)
Admission: RE | Admit: 2011-04-12 | Discharge: 2011-04-12 | Disposition: A | Discharge status: Home / Self Care | Payer: BC Managed Care – PPO | Source: Ambulatory Visit | Attending: Obstetrics and Gynecology | Admitting: Obstetrics and Gynecology

## 2011-04-12 ENCOUNTER — Encounter (INDEPENDENT_AMBULATORY_CARE_PROVIDER_SITE_OTHER): Payer: BC Managed Care – PPO | Admitting: Obstetrics and Gynecology

## 2011-04-12 DIAGNOSIS — R82998 Other abnormal findings in urine: Secondary | ICD-10-CM

## 2011-04-12 DIAGNOSIS — Z01419 Encounter for gynecological examination (general) (routine) without abnormal findings: Secondary | ICD-10-CM

## 2011-04-12 DIAGNOSIS — Z124 Encounter for screening for malignant neoplasm of cervix: Secondary | ICD-10-CM | POA: Insufficient documentation

## 2011-04-13 ENCOUNTER — Other Ambulatory Visit (INDEPENDENT_AMBULATORY_CARE_PROVIDER_SITE_OTHER): Payer: BC Managed Care – PPO | Admitting: Internal Medicine

## 2011-04-13 ENCOUNTER — Other Ambulatory Visit (INDEPENDENT_AMBULATORY_CARE_PROVIDER_SITE_OTHER): Payer: BC Managed Care – PPO

## 2011-04-13 ENCOUNTER — Other Ambulatory Visit: Payer: Self-pay | Admitting: Internal Medicine

## 2011-04-13 DIAGNOSIS — Z0000 Encounter for general adult medical examination without abnormal findings: Secondary | ICD-10-CM

## 2011-04-13 LAB — HEPATIC FUNCTION PANEL
AST: 31 U/L (ref 0–37)
Alkaline Phosphatase: 98 U/L (ref 39–117)
Bilirubin, Direct: 0 mg/dL (ref 0.0–0.3)
Total Protein: 6.8 g/dL (ref 6.0–8.3)

## 2011-04-13 LAB — CBC WITH DIFFERENTIAL/PLATELET
Basophils Absolute: 0 10*3/uL (ref 0.0–0.1)
Eosinophils Absolute: 0.1 10*3/uL (ref 0.0–0.7)
MCHC: 33.9 g/dL (ref 30.0–36.0)
MCV: 94.6 fl (ref 78.0–100.0)
Monocytes Absolute: 0.4 10*3/uL (ref 0.1–1.0)
Neutrophils Relative %: 65.5 % (ref 43.0–77.0)
Platelets: 177 10*3/uL (ref 150.0–400.0)
RDW: 13.5 % (ref 11.5–14.6)
WBC: 5.7 10*3/uL (ref 4.5–10.5)

## 2011-04-13 LAB — URINALYSIS, ROUTINE W REFLEX MICROSCOPIC
Nitrite: NEGATIVE
Urobilinogen, UA: 0.2 (ref 0.0–1.0)

## 2011-04-13 LAB — BASIC METABOLIC PANEL
BUN: 26 mg/dL — ABNORMAL HIGH (ref 6–23)
Calcium: 9 mg/dL (ref 8.4–10.5)
Creatinine, Ser: 0.8 mg/dL (ref 0.4–1.2)
GFR: 73.48 mL/min (ref >60.00)
Glucose, Bld: 97 mg/dL (ref 70–99)
Potassium: 4.4 mEq/L (ref 3.5–5.1)

## 2011-04-13 LAB — LIPID PANEL
Cholesterol: 213 mg/dL — ABNORMAL HIGH (ref 0–200)
VLDL: 9 mg/dL (ref 0.0–40.0)

## 2011-04-13 LAB — HM PAP SMEAR

## 2011-04-20 ENCOUNTER — Ambulatory Visit (INDEPENDENT_AMBULATORY_CARE_PROVIDER_SITE_OTHER): Payer: BC Managed Care – PPO | Admitting: Internal Medicine

## 2011-04-20 ENCOUNTER — Encounter: Payer: Self-pay | Admitting: Internal Medicine

## 2011-04-20 DIAGNOSIS — E785 Hyperlipidemia, unspecified: Secondary | ICD-10-CM

## 2011-04-20 DIAGNOSIS — Z0000 Encounter for general adult medical examination without abnormal findings: Secondary | ICD-10-CM

## 2011-04-20 MED ORDER — OMEGA-3 FATTY ACIDS 1000 MG PO CAPS: 2.0000 g | ORAL_CAPSULE | Freq: Two times a day (BID) | ORAL | Status: AC

## 2011-04-20 NOTE — Patient Instructions (Signed)
Calorie Counting Diet A calorie counting diet requires you to eat the number of calories that are right for you during a day. Calories are the measurement of how much energy you get from the food you eat. Eating the right amount of calories is important for staying at a healthy weight. If you eat too many calories your body will store them as fat and you may gain weight. If you eat too few calories you may lose weight. Counting the number of calories that you eat during a day will help you to know if you're eating the right amount. A Registered Dietitian can determine how many calories you need in a day. The amount of calories you need varies from person to person. If your goal is to lose weight you will need to eat fewer calories. Losing weight can benefit you if you are overweight or have health problems such as heart disease, high blood pressure or diabetes. If your goal is to gain weight, you will need to eat more calories. Gaining weight may be necessary if you have a certain health problem that causes your body to need more energy. TIPS Whether you are increasing or decreasing the number of calories you eat during a day, it may be hard to get used to changing what you eat and drink. The following are tips to help you keep track of the number of calories you are eating.  Measuring foods at home with measuring cups will help you to know the actual amount of food and number of calories you are eating.   Restaurants serve food in all different portion sizes. It is common that restaurants will serve food in amounts worth 2 or more serving sizes. While eating out, it may be helpful to estimate how many servings of a food you are given. For example, a serving of cooked rice is 1/2 cup and that is the size of half of a fist. Knowing serving sizes will help you have a better idea of how much food you are eating at restaurants.   Ask for smaller portion sizes or child-size portions at restaurants.   Plan to  eat half of a meal at a restaurant and take the rest home or share the other half with a friend   Read food labels for calorie content and serving size   Most packaged food has a Nutrition Facts Panel on its side or back. Here you can find out how many servings are in a package, the size of a serving, and the number of calories each serving has.   The serving size and number of servings per container are listed right below the Nutrition Facts heading. Just below the serving information, the number of calories in each serving is listed.   For example, say that a package has three cookies inside. The Nutrition Facts panel says that one serving is one cookie. Below that, it says that there are three servings in the container. The calories section of the Nutrition Facts says there are 90 calories. That means that there are 90 calories in one cookie. If you eat one cookie you have eaten 90 calories. If you eat all three cookies, you have eaten three times that amount, or 270 calories.  The list below tells you how big or small some common portion sizes are.  1 ounce (oz).................4 stacked dice.   3 oz..............................Deck of cards.   1 teaspoon (tsp)...........Tip of little finger.   1 tablespoon (Tbsp)....Tip of thumb.     2 Tbsp..........................Golf ball.    Cup..........................Half of a fist.   1 Cup...........................A fist.  KEEP A FOOD LOG Write down every food item that you eat, how much of the food you eat, and the number of calories in each food that you eat during the day. At the end of the day or throughout the day you can add up the total number of calories you have eaten.  It may help to set up a list like the one below. Find out the calorie information by reading food labels.  Breakfast   Bran Flakes (1 cup, 110 calories).   Fat free milk ( cup, 45 calories).   Snack   Apple (1 medium, 80 calories).   Lunch   Spinach (1  cup, 20 calories).   Tomato ( medium, 20 calories).   Chicken breast strips (3 oz, 165 calories).   Shredded cheddar cheese ( cup, 110 calories).   Light Italian dressing (2 Tbsp, 60 calories).   Whole wheat bread (1 slice, 80 calories).   Tub margarine (1 tsp, 35 calories).   Vegetable soup (1 cup, 160 calories).   Dinner   Pork chop (3 oz, 190 calories).   Brown rice (1 cup, 215 calories).   Steamed broccoli ( cup, 20 calories).   Strawberries (1  cup, 65 calories).   Whipped cream (1 Tbsp, 50 calories).  Daily Calorie Total: 1425 Information from www.eatright.org, Foodwise Nutritional Analysis Database. Document Released: 10/01/2005 Document Re-Released: 10/23/2009 [employer] Patient Information 2011 [employer]. 

## 2011-04-20 NOTE — Assessment & Plan Note (Signed)
Increased weight and increased LDL

## 2011-04-20 NOTE — Progress Notes (Signed)
Subjective:    Patient ID: [name], female    DOB: [birth year], 65 y.o.   MRN: [id number]  HPI  Here for CPX monitoring of lipids Fracture to hand with follow up with Dr Merlyn Lot Pt had a bone density with orthopedist Pt is discussing hysterectomy with GYN Mild vit d loss seen   Review of Systems  Constitutional: Negative for activity change, appetite change and fatigue.  HENT: Negative for ear pain, congestion, neck pain, postnasal drip and sinus pressure.   Eyes: Negative for redness and visual disturbance.  Respiratory: Negative for cough, shortness of breath and wheezing.   Gastrointestinal: Negative for abdominal pain and abdominal distention.  Genitourinary: Negative for dysuria, frequency and menstrual problem.  Musculoskeletal: Negative for myalgias, joint swelling and arthralgias.  Skin: Negative for rash and wound.  Neurological: Negative for dizziness, weakness and headaches.  Hematological: Negative for adenopathy. Does not bruise/bleed easily.  Psychiatric/Behavioral: Negative for sleep disturbance and decreased concentration.   Past Medical History  Diagnosis Date  . SHINGLES 11/08/2008  . HYPERLIPIDEMIA, BORDERLINE 02/09/2009  . Obesity, unspecified 08/06/2008  . VARICOSE VEINS LOWER EXTREMITIES W/INFLAMMATION 02/05/2008  . ASTHMATIC BRONCHITIS, ACUTE 01/29/2008  . ASTHMA UNSPECIFIED WITH EXACERBATION 11/08/2008  . OSTEOARTHRITIS 04/24/2007  . OSTEOPOROSIS 04/24/2007   Past Surgical History  Procedure Date  . Rotator cuff repair   . Knee arthroscopy   . Spine surgery     lumbar laminectomy  . Spine surgery 2008    lumbar fusion    reports that she has never smoked. She does not have any smokeless tobacco history on file. Her alcohol and drug histories not on file. family history includes Cancer in her mother and other and Hypertension in her father. No Known Allergies     Objective:   Physical Exam  Nursing note and vitals reviewed. Constitutional: She  is oriented to person, place, and time. She appears well-developed and well-nourished. No distress.  HENT:  Head: Normocephalic and atraumatic.  Right Ear: External ear normal.  Left Ear: External ear normal.  Nose: Nose normal.  Mouth/Throat: Oropharynx is clear and moist.  Eyes: Conjunctivae and EOM are normal. Pupils are equal, round, and reactive to light.  Neck: Normal range of motion. Neck supple. No JVD present. No tracheal deviation present. No thyromegaly present.  Cardiovascular: Normal rate, regular rhythm, normal heart sounds and intact distal pulses.   No murmur heard. Pulmonary/Chest: Effort normal and breath sounds normal. She has no wheezes. She exhibits no tenderness.  Abdominal: Soft. Bowel sounds are normal.  Musculoskeletal: Normal range of motion. She exhibits no edema and no tenderness.       Right hand in cast  Lymphadenopathy:    She has no cervical adenopathy.  Neurological: She is alert and oriented to person, place, and time. She has normal reflexes. No cranial nerve deficit.  Skin: Skin is warm and dry. She is not diaphoretic.  Psychiatric: She has a normal mood and affect. Her behavior is normal.          Assessment & Plan:   This is a routine physical examination for this healthy  Female. Reviewed all health maintenance protocols including mammography colonoscopy bone density and reviewed appropriate screening labs. Her immunization history was reviewed as well as her current medications and allergies refills of her chronic medications were given and the plan for yearly health maintenance was discussed all orders and referrals were made as appropriate. Reviewed density from orthopedist Stressed need for weight loss Reviewed lipids  and set goal varicosities still hurt her, wearing stocking

## 2011-04-25 ENCOUNTER — Other Ambulatory Visit: Payer: BC Managed Care – PPO

## 2011-04-25 ENCOUNTER — Ambulatory Visit (INDEPENDENT_AMBULATORY_CARE_PROVIDER_SITE_OTHER): Payer: BC Managed Care – PPO | Admitting: Obstetrics and Gynecology

## 2011-04-25 DIAGNOSIS — N9489 Other specified conditions associated with female genital organs and menstrual cycle: Secondary | ICD-10-CM

## 2011-04-25 DIAGNOSIS — Z8041 Family history of malignant neoplasm of ovary: Secondary | ICD-10-CM

## 2011-04-25 DIAGNOSIS — M549 Dorsalgia, unspecified: Secondary | ICD-10-CM

## 2011-05-14 ENCOUNTER — Telehealth: Payer: Self-pay | Admitting: Internal Medicine

## 2011-05-14 MED ORDER — BECLOMETHASONE DIPROPIONATE 80 MCG/ACT IN AERS: 1.0000 | INHALATION_SPRAY | Freq: Two times a day (BID) | RESPIRATORY_TRACT | Status: DC

## 2011-05-14 NOTE — Telephone Encounter (Signed)
Pt has gone out of town and has forgot her inhaler and is requesting to have a prescription be called in for beclomethasone (QVAR) 80 MCG/ACT to the [facility] pharmacy in [place]   [phone]           Fax: [phone]  It you can not get in tough with pt please call on cell

## 2011-05-14 NOTE — Telephone Encounter (Signed)
done

## 2011-06-04 ENCOUNTER — Encounter: Payer: Self-pay | Admitting: Internal Medicine

## 2011-06-04 ENCOUNTER — Ambulatory Visit (INDEPENDENT_AMBULATORY_CARE_PROVIDER_SITE_OTHER): Payer: BC Managed Care – PPO | Admitting: Internal Medicine

## 2011-06-04 DIAGNOSIS — J069 Acute upper respiratory infection, unspecified: Secondary | ICD-10-CM

## 2011-06-04 MED ORDER — CEFUROXIME AXETIL 500 MG PO TABS: 500.0000 mg | ORAL_TABLET | Freq: Two times a day (BID) | ORAL | Status: AC

## 2011-06-04 NOTE — Assessment & Plan Note (Signed)
Pt likely has viral URI.  We discussed symptoms of secondary bacterial sinusitis.  Since she is traveling to [place], we provided rx for ceftin to have on hand. Reviewed symptomatic tx. Use decongestant before flight.

## 2011-06-04 NOTE — Progress Notes (Signed)
Subjective:    Patient ID: [name], female    DOB: [birth year], 65 y.o.   MRN: [id number]  URI  This is a new problem. The current episode started in the past 7 days. The problem has been unchanged. There has been no fever. Associated symptoms include congestion, coughing, ear pain, sinus pain and a sore throat. Pertinent negatives include no diarrhea or nausea.   She is worried about her flight to [facility] causing exacerbation of her current ear pain.  Review of Systems  HENT: Positive for ear pain, congestion and sore throat.   Respiratory: Positive for cough.   Gastrointestinal: Negative for nausea and diarrhea.       Past Medical History  Diagnosis Date  . SHINGLES 11/08/2008  . HYPERLIPIDEMIA, BORDERLINE 02/09/2009  . Obesity, unspecified 08/06/2008  . VARICOSE VEINS LOWER EXTREMITIES W/INFLAMMATION 02/05/2008  . ASTHMATIC BRONCHITIS, ACUTE 01/29/2008  . ASTHMA UNSPECIFIED WITH EXACERBATION 11/08/2008  . OSTEOARTHRITIS 04/24/2007  . OSTEOPOROSIS 04/24/2007    History   Social History  . Marital Status: Married    Spouse Name: N/A    Number of Children: N/A  . Years of Education: N/A   Occupational History  . Not on file.   Social History Main Topics  . Smoking status: Never Smoker   . Smokeless tobacco: Not on file  . Alcohol Use: Not on file  . Drug Use: Not on file  . Sexually Active: Yes   Other Topics Concern  . Not on file   Social History Narrative  . No narrative on file    Past Surgical History  Procedure Date  . Rotator cuff repair   . Knee arthroscopy   . Spine surgery     lumbar laminectomy  . Spine surgery 2008    lumbar fusion    Family History  Problem Relation Age of Onset  . Cancer Mother     breast  . Cancer Other     melanoma  . Hypertension Father     No Known Allergies  Current Outpatient Prescriptions on File Prior to Visit  Medication Sig Dispense Refill  . aspirin 81 MG EC tablet Take 81 mg by mouth daily.        .  beclomethasone (QVAR) 80 MCG/ACT inhaler Inhale 1 puff into the lungs 2 (two) times daily.  1 Inhaler  3  . Calcium Carbonate-Vit D-Min (CALCIUM 1200 PO) Take 1,200 mg by mouth daily.        . cetirizine (ZYRTEC ALLERGY) 10 MG tablet Take 10 mg by mouth at bedtime.        . Cholecalciferol (VITAMIN D3) 2000 UNITS TABS Take 2,000 Units by mouth 2 (two) times daily.        . fish oil-omega-3 fatty acids 1000 MG capsule Take 2 capsules (2 g total) by mouth 2 (two) times daily. total for 4,000 mg      . Glucosamine-Chondroit-Vit C-Mn (GLUCOSAMINE CHONDR 1500 COMPLX) CAPS Take by mouth daily.        . meloxicam (MOBIC) 7.5 MG tablet TAKE ONE TABLET BY MOUTH TWICE DAILY  180 tablet  3  . NON FORMULARY [employer] Saline Nasal Spray .65% Soln (Saline)         BP 104/72  Temp(Src) 98.6 F (37 C) (Oral)  Wt 224 lb (101.606 kg)    Objective:   Physical Exam   Constitutional: Appears well-developed and well-nourished. No distress.  Head: Normocephalic and atraumatic.  Right Ear: External ear normal.  Left  Ear: External ear normal.  Mouth/Throat: Oropharynx with mild redness Eyes: Conjunctivae are normal. Pupils are equal, round, and reactive to light.  Neck: Normal range of motion. Neck supple. No thyromegaly present. No carotid bruit Cardiovascular: Normal rate, regular rhythm and normal heart sounds.  Exam reveals no gallop and no friction rub.   No murmur heard. Pulmonary/Chest: Effort normal and breath sounds normal.  No wheezes. No rales.       Assessment & Plan:

## 2011-06-04 NOTE — Patient Instructions (Signed)
Increase fluids Take tylenol 650 mg 3 x daily as needed Ok to take extra dose of vitamin C Use decongestant (Zytec D 12 hour or allegra D 12 hour) before your flight

## 2011-06-21 ENCOUNTER — Encounter: Payer: Self-pay | Admitting: Internal Medicine

## 2011-06-21 ENCOUNTER — Ambulatory Visit (INDEPENDENT_AMBULATORY_CARE_PROVIDER_SITE_OTHER): Payer: BC Managed Care – PPO | Admitting: Internal Medicine

## 2011-06-21 DIAGNOSIS — J019 Acute sinusitis, unspecified: Secondary | ICD-10-CM

## 2011-06-21 MED ORDER — AZITHROMYCIN 250 MG PO TABS: ORAL_TABLET | ORAL | Status: AC

## 2011-06-21 NOTE — Patient Instructions (Signed)
Please call our office if your symptoms do not improve or gets worse.  

## 2011-06-21 NOTE — Assessment & Plan Note (Signed)
Pt with symptoms of acute sinusitis.  Unfortunately she could not tolerate ceftin due to GI side effects.  Trial of azithromycin 500mg  daily x 3 days.  Sinusitis may trigger bronchitis. Patient advised to call office if symptoms persist or worsen.

## 2011-06-21 NOTE — Progress Notes (Signed)
Subjective:    Patient ID: [name], female    DOB: 12/12/1945, 65 y.o.   MRN: [id number]  HPI  65 y/o white female for f/u.  Pt seen for URI symptoms on 8/20.  She traveled to [place] to visit her sister.  Her secretions were initially clear but then turned yellow and green.  She c/o sinus pressure headaches.  She started ceftin as instructed but had to stop after two days due to GI side effects.  See nursing notes.     Review of Systems Negative for fever or chills,   No SOB  Past Medical History  Diagnosis Date  . SHINGLES 11/08/2008  . HYPERLIPIDEMIA, BORDERLINE 02/09/2009  . Obesity, unspecified 08/06/2008  . VARICOSE VEINS LOWER EXTREMITIES W/INFLAMMATION 02/05/2008  . ASTHMATIC BRONCHITIS, ACUTE 01/29/2008  . ASTHMA UNSPECIFIED WITH EXACERBATION 11/08/2008  . OSTEOARTHRITIS 04/24/2007  . OSTEOPOROSIS 04/24/2007  . Chest pain 03/2011    negative stress test - Dr. Myrtis Ser    History   Social History  . Marital Status: Married    Spouse Name: N/A    Number of Children: N/A  . Years of Education: N/A   Occupational History  . Not on file.   Social History Main Topics  . Smoking status: Never Smoker   . Smokeless tobacco: Not on file  . Alcohol Use: Not on file  . Drug Use: Not on file  . Sexually Active: Yes   Other Topics Concern  . Not on file   Social History Narrative  . No narrative on file    Past Surgical History  Procedure Date  . Rotator cuff repair   . Knee arthroscopy   . Spine surgery     lumbar laminectomy  . Spine surgery 2008    lumbar fusion    Family History  Problem Relation Age of Onset  . Cancer Mother     breast  . Cancer Other     melanoma  . Hypertension Father     Allergies  Allergen Reactions  . Cefuroxime Axetil Diarrhea and Nausea And Vomiting    Current Outpatient Prescriptions on File Prior to Visit  Medication Sig Dispense Refill  . aspirin 81 MG EC tablet Take 81 mg by mouth daily.        . beclomethasone  (QVAR) 80 MCG/ACT inhaler Inhale 1 puff into the lungs 2 (two) times daily.  1 Inhaler  3  . Calcium Carbonate-Vit D-Min (CALCIUM 1200 PO) Take 1,200 mg by mouth daily.        . cetirizine (ZYRTEC ALLERGY) 10 MG tablet Take 10 mg by mouth at bedtime.        . Cholecalciferol (VITAMIN D3) 2000 UNITS TABS Take 2,000 Units by mouth 2 (two) times daily.        . fish oil-omega-3 fatty acids 1000 MG capsule Take 2 capsules (2 g total) by mouth 2 (two) times daily. total for 4,000 mg      . Glucosamine-Chondroit-Vit C-Mn (GLUCOSAMINE CHONDR 1500 COMPLX) CAPS Take by mouth daily.        . meloxicam (MOBIC) 7.5 MG tablet TAKE ONE TABLET BY MOUTH TWICE DAILY  180 tablet  3  . NON FORMULARY [employer] Saline Nasal Spray .65% Soln (Saline)         BP 130/70  Temp(Src) 97.8 F (36.6 C) (Oral)  Wt 225 lb (102.059 kg)       Objective:   Physical Exam    Constitutional: Appears well-developed and well-nourished.  No distress.  Head: Normocephalic and atraumatic.  Right Ear: External ear normal.  Left Ear: External ear normal.  Mouth/Throat: mild redness,  Post nasal gtt Eyes: Conjunctivae are normal. Pupils are equal, round, and reactive to light.  Neck: Normal range of motion. Neck supple. No thyromegaly present. No carotid bruit Cardiovascular: Normal rate, regular rhythm and normal heart sounds.  Exam reveals no gallop and no friction rub.   No murmur heard. Pulmonary/Chest: Effort normal and breath sounds normal.  Mild exp coarse breath sounds at bases       Assessment & Plan:

## 2011-07-30 LAB — BASIC METABOLIC PANEL
BUN: 26 — ABNORMAL HIGH
CO2: 30
Calcium: 9.9
Creatinine, Ser: 0.94
GFR calc non Af Amer: >60
Glucose, Bld: 102 — ABNORMAL HIGH

## 2011-07-30 LAB — DIFFERENTIAL
Basophils Absolute: 0
Basophils Relative: 1
Lymphocytes Relative: 23
Neutro Abs: 3.6
Neutrophils Relative %: 69

## 2011-07-30 LAB — TYPE AND SCREEN
ABO/RH(D): O NEG
Antibody Screen: NEGATIVE

## 2011-07-30 LAB — CBC
MCHC: 34.1
Platelets: 189
RDW: 12.5

## 2011-10-11 ENCOUNTER — Encounter: Payer: Self-pay | Admitting: Internal Medicine

## 2011-11-02 ENCOUNTER — Encounter: Payer: Self-pay | Admitting: Internal Medicine

## 2011-11-02 ENCOUNTER — Ambulatory Visit (INDEPENDENT_AMBULATORY_CARE_PROVIDER_SITE_OTHER): Payer: BC Managed Care – PPO | Admitting: Internal Medicine

## 2011-11-02 VITALS — BP 120/80 | HR 64 | Temp 97.9°F | Ht 64.0 in | Wt 224.0 lb

## 2011-11-02 DIAGNOSIS — E785 Hyperlipidemia, unspecified: Secondary | ICD-10-CM

## 2011-11-02 DIAGNOSIS — J45909 Unspecified asthma, uncomplicated: Secondary | ICD-10-CM

## 2011-11-02 DIAGNOSIS — M1990 Unspecified osteoarthritis, unspecified site: Secondary | ICD-10-CM

## 2011-11-02 DIAGNOSIS — E669 Obesity, unspecified: Secondary | ICD-10-CM

## 2011-11-02 LAB — BASIC METABOLIC PANEL
CO2: 27 mEq/L (ref 19–32)
Chloride: 102 mEq/L (ref 96–112)
Potassium: 4 mEq/L (ref 3.5–5.1)
Sodium: 140 mEq/L (ref 135–145)

## 2011-11-02 LAB — LIPID PANEL
LDL Cholesterol: 117 mg/dL — ABNORMAL HIGH (ref 0–99)
Total CHOL/HDL Ratio: 3

## 2011-11-02 LAB — HEPATIC FUNCTION PANEL
ALT: 17 U/L (ref 0–35)
Alkaline Phosphatase: 83 U/L (ref 39–117)
Bilirubin, Direct: 0 mg/dL (ref 0.0–0.3)
Total Bilirubin: 0.4 mg/dL (ref 0.3–1.2)
Total Protein: 7.3 g/dL (ref 6.0–8.3)

## 2011-11-02 NOTE — Progress Notes (Signed)
Subjective:    Patient ID: [name], female    DOB: 1946/01/17, 66 y.o.   MRN: [id number]  HPI Presents for increased weight, hx of obesisty  Asthma stable on qvar Hx of varicous veins Back and neck pain   Review of Systems  Constitutional: Negative for activity change, appetite change and fatigue.  HENT: Negative for ear pain, congestion, neck pain, postnasal drip and sinus pressure.   Eyes: Negative for redness and visual disturbance.  Respiratory: Negative for cough, shortness of breath and wheezing.   Gastrointestinal: Negative for abdominal pain and abdominal distention.  Genitourinary: Negative for dysuria, frequency and menstrual problem.  Musculoskeletal: Negative for myalgias, joint swelling and arthralgias.  Skin: Negative for rash and wound.  Neurological: Negative for dizziness, weakness and headaches.  Hematological: Negative for adenopathy. Does not bruise/bleed easily.  Psychiatric/Behavioral: Negative for sleep disturbance and decreased concentration.   Past Medical History  Diagnosis Date  . SHINGLES 11/08/2008  . HYPERLIPIDEMIA, BORDERLINE 02/09/2009  . Obesity, unspecified 08/06/2008  . VARICOSE VEINS LOWER EXTREMITIES W/INFLAMMATION 02/05/2008  . ASTHMATIC BRONCHITIS, ACUTE 01/29/2008  . ASTHMA UNSPECIFIED WITH EXACERBATION 11/08/2008  . OSTEOARTHRITIS 04/24/2007  . OSTEOPOROSIS 04/24/2007  . Chest pain 03/2011    negative stress test - Dr. Myrtis Ser    History   Social History  . Marital Status: Married    Spouse Name: N/A    Number of Children: N/A  . Years of Education: N/A   Occupational History  . Not on file.   Social History Main Topics  . Smoking status: Never Smoker   . Smokeless tobacco: Not on file  . Alcohol Use: Not on file  . Drug Use: Not on file  . Sexually Active: Yes   Other Topics Concern  . Not on file   Social History Narrative  . No narrative on file    Past Surgical History  Procedure Date  . Rotator cuff repair   .  Knee arthroscopy   . Spine surgery     lumbar laminectomy  . Spine surgery 2008    lumbar fusion    Family History  Problem Relation Age of Onset  . Cancer Mother     breast  . Cancer Other     melanoma  . Hypertension Father     Allergies  Allergen Reactions  . Cefuroxime Axetil Diarrhea and Nausea And Vomiting    Current Outpatient Prescriptions on File Prior to Visit  Medication Sig Dispense Refill  . aspirin 81 MG EC tablet Take 81 mg by mouth daily.        . beclomethasone (QVAR) 80 MCG/ACT inhaler Inhale 1 puff into the lungs 2 (two) times daily.  1 Inhaler  3  . Calcium Carbonate-Vit D-Min (CALCIUM 1200 PO) Take 1,200 mg by mouth daily.        . cetirizine (ZYRTEC ALLERGY) 10 MG tablet Take 10 mg by mouth at bedtime.        . Cholecalciferol (VITAMIN D3) 2000 UNITS TABS Take 2,000 Units by mouth 2 (two) times daily.        . fish oil-omega-3 fatty acids 1000 MG capsule Take 2 capsules (2 g total) by mouth 2 (two) times daily. total for 4,000 mg      . Glucosamine-Chondroit-Vit C-Mn (GLUCOSAMINE CHONDR 1500 COMPLX) CAPS Take by mouth daily.        . meloxicam (MOBIC) 7.5 MG tablet TAKE ONE TABLET BY MOUTH TWICE DAILY  180 tablet  3  BP 120/80  Pulse 64  Temp(Src) 97.9 F (36.6 C) (Oral)  Ht 5\' 4"  (1.626 m)  Wt 224 lb (101.606 kg)  BMI 38.45 kg/m2       Objective:   Physical Exam  Nursing note and vitals reviewed. Constitutional: She is oriented to person, place, and time. She appears well-developed and well-nourished. No distress.  HENT:  Head: Normocephalic and atraumatic.  Right Ear: External ear normal.  Left Ear: External ear normal.  Nose: Nose normal.  Mouth/Throat: Oropharynx is clear and moist.  Eyes: Conjunctivae and EOM are normal. Pupils are equal, round, and reactive to light.  Neck: Normal range of motion. Neck supple. No JVD present. No tracheal deviation present. No thyromegaly present.  Cardiovascular: Normal rate, regular rhythm, normal  heart sounds and intact distal pulses.   No murmur heard. Pulmonary/Chest: Effort normal and breath sounds normal. She has no wheezes. She exhibits no tenderness.  Abdominal: Soft. Bowel sounds are normal.  Musculoskeletal: Normal range of motion. She exhibits no edema and no tenderness.  Lymphadenopathy:    She has no cervical adenopathy.  Neurological: She is alert and oriented to person, place, and time. She has normal reflexes. No cranial nerve deficit.  Skin: Skin is warm and dry. She is not diaphoretic.  Psychiatric: She has a normal mood and affect. Her behavior is normal.          Assessment & Plan:  Stable asthma Weight loss on weight watchers program DJD of knees with orthopedist folllow up

## 2011-11-02 NOTE — Progress Notes (Signed)
Subjective:    Patient ID: [name], female    DOB: [birth year], 66 y.o.   MRN: [id number]  HPI The patient is a 66 year old white female who is followed for asthma hyperlipidemia, obesity and osteoarthritis.  She had been on a trajectory of losing weight but this has plateaued due to multiple factors she is recently joined [employer] to me in big rate her weight loss she displays good knowledge of her condition and is motivated to pursue weight loss we'll eyes significant effect on her cholesterol varicose veins asthma and osteoarthritis   Review of Systems  Constitutional: Negative for activity change, appetite change and fatigue.  HENT: Negative for ear pain, congestion, neck pain, postnasal drip and sinus pressure.   Eyes: Negative for redness and visual disturbance.  Respiratory: Negative for cough, shortness of breath and wheezing.   Gastrointestinal: Negative for abdominal pain and abdominal distention.  Genitourinary: Negative for dysuria, frequency and menstrual problem.  Musculoskeletal: Negative for myalgias, joint swelling and arthralgias.  Skin: Negative for rash and wound.  Neurological: Negative for dizziness, weakness and headaches.  Hematological: Negative for adenopathy. Does not bruise/bleed easily.  Psychiatric/Behavioral: Negative for sleep disturbance and decreased concentration.   Past Medical History  Diagnosis Date  . SHINGLES 11/08/2008  . HYPERLIPIDEMIA, BORDERLINE 02/09/2009  . Obesity, unspecified 08/06/2008  . VARICOSE VEINS LOWER EXTREMITIES W/INFLAMMATION 02/05/2008  . ASTHMATIC BRONCHITIS, ACUTE 01/29/2008  . ASTHMA UNSPECIFIED WITH EXACERBATION 11/08/2008  . OSTEOARTHRITIS 04/24/2007  . OSTEOPOROSIS 04/24/2007  . Chest pain 03/2011    negative stress test - Dr. Myrtis Ser    History   Social History  . Marital Status: Married    Spouse Name: N/A    Number of Children: N/A  . Years of Education: N/A   Occupational History  . Not on file.    Social History Main Topics  . Smoking status: Never Smoker   . Smokeless tobacco: Not on file  . Alcohol Use: Not on file  . Drug Use: Not on file  . Sexually Active: Yes   Other Topics Concern  . Not on file   Social History Narrative  . No narrative on file    Past Surgical History  Procedure Date  . Rotator cuff repair   . Knee arthroscopy   . Spine surgery     lumbar laminectomy  . Spine surgery 2008    lumbar fusion    Family History  Problem Relation Age of Onset  . Cancer Mother     breast  . Cancer Other     melanoma  . Hypertension Father     Allergies  Allergen Reactions  . Cefuroxime Axetil Diarrhea and Nausea And Vomiting    Current Outpatient Prescriptions on File Prior to Visit  Medication Sig Dispense Refill  . aspirin 81 MG EC tablet Take 81 mg by mouth daily.        . beclomethasone (QVAR) 80 MCG/ACT inhaler Inhale 1 puff into the lungs 2 (two) times daily.  1 Inhaler  3  . Calcium Carbonate-Vit D-Min (CALCIUM 1200 PO) Take 1,200 mg by mouth daily.        . cetirizine (ZYRTEC ALLERGY) 10 MG tablet Take 10 mg by mouth at bedtime.        . Cholecalciferol (VITAMIN D3) 2000 UNITS TABS Take 2,000 Units by mouth 2 (two) times daily.        . fish oil-omega-3 fatty acids 1000 MG capsule Take 2 capsules (2 g total) by  mouth 2 (two) times daily. total for 4,000 mg      . Glucosamine-Chondroit-Vit C-Mn (GLUCOSAMINE CHONDR 1500 COMPLX) CAPS Take by mouth daily.        . meloxicam (MOBIC) 7.5 MG tablet TAKE ONE TABLET BY MOUTH TWICE DAILY  180 tablet  3    BP 120/80  Pulse 64  Temp(Src) 97.9 F (36.6 C) (Oral)  Wt 224 lb (101.606 kg)       Objective:   Physical Exam  Constitutional: She is oriented to person, place, and time. She appears well-developed and well-nourished. No distress.  HENT:  Head: Normocephalic and atraumatic.  Right Ear: External ear normal.  Left Ear: External ear normal.  Nose: Nose normal.  Mouth/Throat: Oropharynx is  clear and moist.  Eyes: Conjunctivae and EOM are normal. Pupils are equal, round, and reactive to light.  Neck: Normal range of motion. Neck supple. No JVD present. No tracheal deviation present. No thyromegaly present.  Cardiovascular: Normal rate, regular rhythm, normal heart sounds and intact distal pulses.   No murmur heard. Pulmonary/Chest: Effort normal and breath sounds normal. She has no wheezes. She exhibits no tenderness.  Abdominal: Soft. Bowel sounds are normal.  Musculoskeletal: Normal range of motion. She exhibits no edema and no tenderness.  Lymphadenopathy:    She has no cervical adenopathy.  Neurological: She is alert and oriented to person, place, and time. She has normal reflexes. No cranial nerve deficit.  Skin: Skin is warm and dry. She is not diaphoretic.  Psychiatric: She has a normal mood and affect. Her behavior is normal.          Assessment & Plan:  Asthma has been stable on the controller drug Qvar She has been taking omega-3 fatty acids for cholesterol and glucosamine chondroitin for her knees as well as Mobic as an anti-inflammatory monitoring of lipids

## 2011-11-02 NOTE — Patient Instructions (Signed)
The patient is instructed to continue all medications as prescribed. Schedule followup with check out clerk upon leaving the clinic  

## 2012-01-25 ENCOUNTER — Other Ambulatory Visit: Payer: Self-pay | Admitting: Internal Medicine

## 2012-03-31 ENCOUNTER — Other Ambulatory Visit: Payer: Self-pay | Admitting: Internal Medicine

## 2012-03-31 ENCOUNTER — Ambulatory Visit (INDEPENDENT_AMBULATORY_CARE_PROVIDER_SITE_OTHER): Payer: MEDICARE | Admitting: Internal Medicine

## 2012-03-31 ENCOUNTER — Telehealth: Payer: Self-pay | Admitting: *Deleted

## 2012-03-31 VITALS — BP 120/70 | HR 72 | Temp 98.3°F | Resp 16 | Ht 64.5 in | Wt 228.0 lb

## 2012-03-31 DIAGNOSIS — G4714 Hypersomnia due to medical condition: Secondary | ICD-10-CM

## 2012-03-31 DIAGNOSIS — R0683 Snoring: Secondary | ICD-10-CM

## 2012-03-31 DIAGNOSIS — G4733 Obstructive sleep apnea (adult) (pediatric): Secondary | ICD-10-CM

## 2012-03-31 DIAGNOSIS — R0609 Other forms of dyspnea: Secondary | ICD-10-CM

## 2012-03-31 NOTE — Telephone Encounter (Signed)
Reentered order 

## 2012-03-31 NOTE — Patient Instructions (Signed)
The patient is instructed to continue all medications as prescribed. Schedule followup with check out clerk upon leaving the clinic  

## 2012-04-16 ENCOUNTER — Ambulatory Visit (HOSPITAL_BASED_OUTPATIENT_CLINIC_OR_DEPARTMENT_OTHER): Payer: MEDICARE | Attending: Internal Medicine | Admitting: Radiology

## 2012-04-16 VITALS — Ht 64.0 in | Wt 222.0 lb

## 2012-04-16 DIAGNOSIS — G4714 Hypersomnia due to medical condition: Secondary | ICD-10-CM

## 2012-04-16 DIAGNOSIS — G4733 Obstructive sleep apnea (adult) (pediatric): Secondary | ICD-10-CM | POA: Insufficient documentation

## 2012-04-16 DIAGNOSIS — R0683 Snoring: Secondary | ICD-10-CM

## 2012-04-19 DIAGNOSIS — G4733 Obstructive sleep apnea (adult) (pediatric): Secondary | ICD-10-CM

## 2012-04-20 NOTE — Procedures (Signed)
[name], STANFORTH                  ACCOUNT NO.:  [id number]  MEDICAL RECORD NO.:  [id number]          PATIENT TYPE:  OUT  LOCATION:  SLEEP CENTER                 FACILITY:  [facility]  PHYSICIAN:  Barbaraann Share, MD,FCCPDATE OF BIRTH:  1947-02-24  DATE OF STUDY:  04/16/2012                           NOCTURNAL POLYSOMNOGRAM  REFERRING PHYSICIAN:  Stacie Glaze, MD  REFERRING PHYSICIAN:  Stacie Glaze, MD  INDICATION FOR STUDY:  Hypersomnia with sleep apnea.  EPWORTH SLEEPINESS SCORE:  10.  SLEEP ARCHITECTURE:  The patient had a total sleep time of 301 minutes with no slow-wave sleep and only 80 minutes of REM.  Sleep onset latency was prolonged at 115 minutes, and REM onset was normal at 77 minutes. Sleep efficiency was poor at 68%.  RESPIRATORY DATA:  The patient was found to have 61 apneas and 15 obstructive hypopneas, giving her an apnea-hypopnea index of 15 events per hour.  The events were not positional, but were clearly worse and more prominent during REM.  There was moderate to loud snoring noted throughout.  The patient did not meet split night protocol secondary to her events not even occurring until after 1 a.m.  OXYGEN DATA:  There was O2 desaturation as low as 83% with the patient's obstructive events.  CARDIAC DATA:  Occasional PAC noted, but no clinically significant arrhythmias were noted.  MOVEMENTS/PARASOMNIA:  The patient had no significant leg jerks or other abnormal behavior seen.  IMPRESSION/RECOMMENDATION: 1. Mild obstructive sleep apnea/hypopnea syndrome with an AHI of 15     events per hour and oxygen desaturation as low as 83%.  The patient     did not meet split night protocol secondary to her events not     starting until after 1 a.m.  Treatment for this degree of sleep     apnea can include a trial of weight loss alone, upper airway     surgery,     dental appliance, and also CPAP.  Clinical correlation is     suggested. 2. Occasional PAC  noted, but no clinically significant arrhythmias     were seen.     Barbaraann Share, MD,FCCP Diplomate, American Board of Sleep Medicine    [facility]  D:  04/19/2012 17:46:18  T:  04/19/2012 23:55:43  Job:  [id number]

## 2012-05-15 ENCOUNTER — Encounter: Payer: Self-pay | Admitting: Gynecology

## 2012-05-15 DIAGNOSIS — M8580 Other specified disorders of bone density and structure, unspecified site: Secondary | ICD-10-CM | POA: Insufficient documentation

## 2012-05-26 ENCOUNTER — Encounter: Payer: Self-pay | Admitting: Obstetrics and Gynecology

## 2012-05-26 ENCOUNTER — Ambulatory Visit (INDEPENDENT_AMBULATORY_CARE_PROVIDER_SITE_OTHER): Payer: Medicare Other | Admitting: Obstetrics and Gynecology

## 2012-05-26 VITALS — BP 124/80 | Ht 64.0 in | Wt 226.0 lb

## 2012-05-26 DIAGNOSIS — IMO0002 Reserved for concepts with insufficient information to code with codable children: Secondary | ICD-10-CM

## 2012-05-26 DIAGNOSIS — R87610 Atypical squamous cells of undetermined significance on cytologic smear of cervix (ASC-US): Secondary | ICD-10-CM

## 2012-05-26 DIAGNOSIS — R351 Nocturia: Secondary | ICD-10-CM

## 2012-05-26 DIAGNOSIS — M899 Disorder of bone, unspecified: Secondary | ICD-10-CM

## 2012-05-26 DIAGNOSIS — R109 Unspecified abdominal pain: Secondary | ICD-10-CM

## 2012-05-26 DIAGNOSIS — M8580 Other specified disorders of bone density and structure, unspecified site: Secondary | ICD-10-CM

## 2012-05-26 NOTE — Patient Instructions (Signed)
Schedule pelvic ultrasound. Make appointment at cancer center for BRCA1 and BRCA2 testing.

## 2012-05-26 NOTE — Progress Notes (Signed)
Patient came to see me today for further followup. After always having normal Pap smears in 2007 she had 2 Pap smears showing ascus without high risk HPV detected. Colposcopy and biopsy failed to reveal dysplasia. She has had normal Pap smears since then. Her last Pap smear was 2012. She is up-to-date on mammograms. Her last bone density was done for her orthopedic surgeon and showed osteopenia. He did not feel she needed medication. She thinks was done earlier this year. She is having midepigastric pain. It is without nausea, vomiting or diarrhea. She has had no change in her bowel habits. She has a mother who probably died from ovarian cancer. She has 2 cousins who had breast cancer before the age of 27. They are both deceased. She has vaginal dryness and uses a lubricant. She has nocturia without incontinence or urgency. She is having no dysuria or hematuria. She is having no pelvic pain. She is having no vaginal bleeding. She has an appointment to see her PCP for annual visit in September.  ROS: 12 system review done. Pertinent positives above.other positives include asthma, sinusitis, osteoarthritis, hyperlipidemia and palpitations.  Physical examination: Kennon Portela present. HEENT within normal limits. Neck: Thyroid not large. No masses. Supraclavicular nodes: not enlarged. Breasts: Examined in both sitting and lying  position. No skin changes and no masses. Abdomen: Soft no guarding rebound or masses or hernia. Pelvic: External: Within normal limits. BUS: Within normal limits. Vaginal:within normal limits. Poor  estrogen effect. No evidence of cystocele rectocele or enterocele. Cervix: clean. Uterus: Normal size and shape. Adnexa: No masses. Rectovaginal exam: Confirmatory and negative. Extremities: Within normal limits.  Assessment: #1. Abdominal pain #2. Family history of early onset breast cancer and ovarian cancer #3. ASCUS   without high-risk HPV. #4. Osteopenia #5. Nocturia #6. Atrophic  vaginitis.  Plan: Pelvic ultrasound scheduled. Patient to go to cancer center and B. Screen for BRCA1 and BRCA2. Bone density followup with orthopedic surgeon.The new Pap smear guidelines were discussed with the patient. No pap done.

## 2012-05-28 ENCOUNTER — Ambulatory Visit (INDEPENDENT_AMBULATORY_CARE_PROVIDER_SITE_OTHER): Payer: Medicare Other

## 2012-05-28 ENCOUNTER — Ambulatory Visit (INDEPENDENT_AMBULATORY_CARE_PROVIDER_SITE_OTHER): Payer: Medicare Other | Admitting: Obstetrics and Gynecology

## 2012-05-28 DIAGNOSIS — N83339 Acquired atrophy of ovary and fallopian tube, unspecified side: Secondary | ICD-10-CM

## 2012-05-28 DIAGNOSIS — R102 Pelvic and perineal pain: Secondary | ICD-10-CM

## 2012-05-28 DIAGNOSIS — N949 Unspecified condition associated with female genital organs and menstrual cycle: Secondary | ICD-10-CM

## 2012-05-28 DIAGNOSIS — R109 Unspecified abdominal pain: Secondary | ICD-10-CM

## 2012-05-28 NOTE — Patient Instructions (Signed)
Schedule appointment at cancer center for BRCA-1 and BRCA-2 testing.

## 2012-05-28 NOTE — Progress Notes (Signed)
Patient came to see me today for an ultrasound due to pelvic pain. On ultrasound her uterus is anteverted and normal except for prominent vascularity in the myometrium. Her endometrial echo is 1.6 mm. Both ovaries are normal. Her cul-de-sac is free of fluid. The patient was reassured. She will also discuss abdominal pain with her PCP. She will also go to the cancer center and be checked for BRCA1 and BRCA2.

## 2012-06-14 ENCOUNTER — Other Ambulatory Visit: Payer: Self-pay | Admitting: Internal Medicine

## 2012-06-24 ENCOUNTER — Other Ambulatory Visit (INDEPENDENT_AMBULATORY_CARE_PROVIDER_SITE_OTHER): Payer: Medicare Other

## 2012-06-24 DIAGNOSIS — E785 Hyperlipidemia, unspecified: Secondary | ICD-10-CM

## 2012-06-24 DIAGNOSIS — Z0000 Encounter for general adult medical examination without abnormal findings: Secondary | ICD-10-CM

## 2012-06-24 LAB — HEPATIC FUNCTION PANEL
Bilirubin, Direct: 0 mg/dL (ref 0.0–0.3)
Total Bilirubin: 0.7 mg/dL (ref 0.3–1.2)

## 2012-06-24 LAB — LIPID PANEL
Cholesterol: 219 mg/dL — ABNORMAL HIGH (ref 0–200)
HDL: 65.1 mg/dL (ref >39.00)
VLDL: 13.6 mg/dL (ref 0.0–40.0)

## 2012-06-24 LAB — CBC WITH DIFFERENTIAL/PLATELET
Basophils Absolute: 0 10*3/uL (ref 0.0–0.1)
Eosinophils Relative: 1.8 % (ref 0.0–5.0)
HCT: 40.9 % (ref 36.0–46.0)
Lymphs Abs: 1.2 10*3/uL (ref 0.7–4.0)
MCV: 95.1 fl (ref 78.0–100.0)
Monocytes Absolute: 0.4 10*3/uL (ref 0.1–1.0)
Neutrophils Relative %: 64.5 % (ref 43.0–77.0)
Platelets: 162 10*3/uL (ref 150.0–400.0)
RDW: 13.4 % (ref 11.5–14.6)
WBC: 4.9 10*3/uL (ref 4.5–10.5)

## 2012-06-24 LAB — BASIC METABOLIC PANEL
BUN: 25 mg/dL — ABNORMAL HIGH (ref 6–23)
Chloride: 106 mEq/L (ref 96–112)
Creatinine, Ser: 0.6 mg/dL (ref 0.4–1.2)
GFR: 106.46 mL/min (ref >60.00)
Glucose, Bld: 92 mg/dL (ref 70–99)
Potassium: 4.9 mEq/L (ref 3.5–5.1)

## 2012-06-24 LAB — POCT URINALYSIS DIPSTICK
Bilirubin, UA: NEGATIVE
Glucose, UA: NEGATIVE
Ketones, UA: NEGATIVE
Spec Grav, UA: 1.02

## 2012-06-24 LAB — LDL CHOLESTEROL, DIRECT: Direct LDL: 140.4 mg/dL

## 2012-06-24 LAB — TSH: TSH: 2.38 u[IU]/mL (ref 0.35–5.50)

## 2012-07-01 ENCOUNTER — Ambulatory Visit (INDEPENDENT_AMBULATORY_CARE_PROVIDER_SITE_OTHER): Payer: Medicare Other | Admitting: Internal Medicine

## 2012-07-01 ENCOUNTER — Encounter: Payer: Self-pay | Admitting: Internal Medicine

## 2012-07-01 VITALS — BP 110/70 | HR 76 | Temp 98.6°F | Resp 16 | Ht 64.5 in | Wt 228.0 lb

## 2012-07-01 DIAGNOSIS — Z0000 Encounter for general adult medical examination without abnormal findings: Secondary | ICD-10-CM

## 2012-07-01 DIAGNOSIS — E785 Hyperlipidemia, unspecified: Secondary | ICD-10-CM

## 2012-07-01 DIAGNOSIS — T887XXA Unspecified adverse effect of drug or medicament, initial encounter: Secondary | ICD-10-CM

## 2012-07-01 MED ORDER — RED YEAST RICE 600 MG PO TABS: 1.0000 | ORAL_TABLET | Freq: Two times a day (BID) | ORAL | Status: DC

## 2012-07-01 NOTE — Patient Instructions (Signed)
The patient is instructed to continue all medications as prescribed. Schedule followup with check out clerk upon leaving the clinic  

## 2012-07-01 NOTE — Progress Notes (Signed)
  Subjective:    Patient ID: [name], female    DOB: 01/30/1946, 66 y.o.   MRN: [id number]  HPI Patient presents for yearly examination Presents for followup of cholesterol. Appropriate laboratory screening was obtained prior to her office visit including a lipid liver a basic metabolic panel and a CBC.  She has hyperlipidemia and has a moderately elevated LDL C. her HDL and triglycerides are normal.  Shows central pedal obesity and we discussed implication of obesity and heart disease  She has seasonal chronic sinusitis and allergic rhinitis for which she uses a saline lavage was noticed an increase postnasal drip and moderate sore throat.  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  HENT: Positive for congestion, rhinorrhea and postnasal drip. Negative for ear pain, neck pain and sinus pressure.   Eyes: Negative for redness and visual disturbance.  Respiratory: Negative for cough, shortness of breath and wheezing.   Gastrointestinal: Negative for abdominal pain and abdominal distention.  Genitourinary: Negative for dysuria, frequency and menstrual problem.  Musculoskeletal: Negative for myalgias, joint swelling and arthralgias.  Skin: Negative for rash and wound.  Neurological: Negative for dizziness, weakness and headaches.  Hematological: Negative for adenopathy. Does not bruise/bleed easily.  Psychiatric/Behavioral: Negative for disturbed wake/sleep cycle and decreased concentration.       Objective:   Physical Exam  Nursing note and vitals reviewed. Constitutional: She is oriented to person, place, and time. She appears well-developed and well-nourished. No distress.  HENT:  Head: Normocephalic and atraumatic.  Right Ear: External ear normal.  Left Ear: External ear normal.  Nose: Nose normal.  Mouth/Throat: Oropharynx is  clear and moist.  Eyes: Conjunctivae normal and EOM are normal. Pupils are equal, round, and reactive to light.  Neck: Normal range of motion. Neck supple. No JVD present. No tracheal deviation present. No thyromegaly present.  Cardiovascular: Normal rate, regular rhythm, normal heart sounds and intact distal pulses.   No murmur heard. Pulmonary/Chest: Effort normal and breath sounds normal. She has no wheezes. She exhibits no tenderness.  Abdominal: Soft. Bowel sounds are normal.  Musculoskeletal: Normal range of motion. She exhibits no edema and no tenderness.  Lymphadenopathy:    She has no cervical adenopathy.  Neurological: She is alert and oriented to person, place, and time. She has normal reflexes. No cranial nerve deficit.  Skin: Skin is warm and dry. She is not diaphoretic.       Prominent retinacular vein development of the feet with 1+ edema  Psychiatric: She has a normal mood and affect. Her behavior is normal.          Assessment & Plan:   This is a routine physical examination for this healthy  Female. Reviewed all health maintenance protocols including mammography colonoscopy bone density and reviewed appropriate screening labs. Her immunization history was reviewed as well as her current medications and allergies refills of her chronic medications were given and the plan for yearly health maintenance was discussed all orders and referrals were made as appropriate.  Discuss adding a red rice yeast 1 by mouth twice a day to assist in lowering her LDL C.  We emphasized the need to wear her compression stockings for lower extremity varicosities with inflammation.  Continue sinus lavage with saline for chronic sinusitis.

## 2012-07-08 ENCOUNTER — Encounter: Payer: Self-pay | Admitting: Internal Medicine

## 2012-07-08 NOTE — Progress Notes (Signed)
  Subjective:    Patient ID: [name], female    DOB: [birth year], 66 y.o.   MRN: [id number]  HPI Patient is a 66 year old female who presents with a chief complaint of worsening allergic rhinitis.  She has noticed increased postnasal drip and moderate sore throat with nasal congestion.  She has noticed edema in her hands. She has a history of osteoarthritis but she separates this from her osteoarthritic pain   Review of Systems  Constitutional: Positive for fatigue.  HENT: Positive for congestion, rhinorrhea, sneezing, postnasal drip and sinus pressure.   Eyes: Positive for itching. Negative for pain.  Respiratory: Positive for cough. Negative for chest tightness.   Cardiovascular: Negative for chest pain.  Gastrointestinal: Negative for abdominal pain and abdominal distention.       Objective:   Physical Exam  Nursing note and vitals reviewed. Constitutional: She appears well-developed and well-nourished.  HENT:  Head: Normocephalic and atraumatic.       Markedly swollen turbinates with erythema posterior cobblestoning. No apparent adenopathy  Eyes: Conjunctivae normal are normal. Pupils are equal, round, and reactive to light.  Neck: Normal range of motion. Neck supple.  Cardiovascular: Normal rate and regular rhythm.   Pulmonary/Chest: Breath sounds normal.          Assessment & Plan:  Allergic rhinitis with chronic sinusitis Recommend continue Zyrtec 10 mg by mouth daily and gave her samples of a nasal corticosteroid to use daily as a prophylaxis.  No evidence for infectious etiology at this point Use of her CPAP recommended consider adding a humidifier nasal saline lavage prior use of CPAP

## 2012-07-14 ENCOUNTER — Telehealth: Payer: Self-pay | Admitting: Internal Medicine

## 2012-07-14 ENCOUNTER — Encounter: Payer: Self-pay | Admitting: Family

## 2012-07-14 ENCOUNTER — Ambulatory Visit (INDEPENDENT_AMBULATORY_CARE_PROVIDER_SITE_OTHER): Payer: Medicare Other | Admitting: Family

## 2012-07-14 VITALS — BP 124/80 | HR 87 | Temp 98.1°F | Wt 230.0 lb

## 2012-07-14 DIAGNOSIS — R05 Cough: Secondary | ICD-10-CM

## 2012-07-14 DIAGNOSIS — J019 Acute sinusitis, unspecified: Secondary | ICD-10-CM

## 2012-07-14 MED ORDER — AMOXICILLIN 500 MG PO CAPS: 1000.0000 mg | ORAL_CAPSULE | Freq: Two times a day (BID) | ORAL | Status: DC

## 2012-07-14 NOTE — Progress Notes (Signed)
Subjective:    Patient ID: [name], female    DOB: 02/03/1946, 66 y.o.   MRN: [id number]  HPI  66 year old white female, nonsmoker, patient of Dr. Lovell Sheehan is in with complaints of cough, sinus pressure, congestion, sneezing x2 weeks. She's been taken over-the-counter Zyrtec with no relief. Has been sent home he'll with similar symptoms.  Review of Systems  Constitutional: Negative.   HENT: Positive for ear pain, congestion, sneezing, postnasal drip and sinus pressure.   Respiratory: Positive for cough. Negative for shortness of breath and wheezing.   Cardiovascular: Negative.   Skin: Negative.   Neurological: Negative.   Psychiatric/Behavioral: Negative.    Past Medical History  Diagnosis Date  . SHINGLES 11/08/2008  . HYPERLIPIDEMIA, BORDERLINE 02/09/2009  . Obesity, unspecified 08/06/2008  . VARICOSE VEINS LOWER EXTREMITIES W/INFLAMMATION 02/05/2008  . ASTHMATIC BRONCHITIS, ACUTE 01/29/2008  . ASTHMA UNSPECIFIED WITH EXACERBATION 11/08/2008  . OSTEOARTHRITIS 04/24/2007  . OSTEOPOROSIS 04/24/2007  . Chest pain 03/2011    negative stress test - Dr. Myrtis Ser  . ASCUS on Pap smear 2007  . Osteopenia     History   Social History  . Marital Status: Married    Spouse Name: N/A    Number of Children: N/A  . Years of Education: N/A   Occupational History  . Not on file.   Social History Main Topics  . Smoking status: Never Smoker   . Smokeless tobacco: Not on file  . Alcohol Use: 0.5 oz/week    1 drink(s) per week  . Drug Use: Not on file  . Sexually Active: Yes    Birth Control/ Protection: Post-menopausal   Other Topics Concern  . Not on file   Social History Narrative  . No narrative on file    Past Surgical History  Procedure Date  . Knee arthroscopy   . Spine surgery 2008  . Colposcopy     Neg HR HPV  . Bone spur shoulder     Family History  Problem Relation Age of Onset  . Ovarian cancer Mother   . Melanoma Mother   . Hypertension Father   . Heart  disease Father   . Diabetes Paternal Aunt   . Heart disease Paternal Grandfather   . Cancer Maternal Grandmother     Oral cancer    Allergies  Allergen Reactions  . Cefuroxime Axetil Diarrhea and Nausea And Vomiting    Current Outpatient Prescriptions on File Prior to Visit  Medication Sig Dispense Refill  . aspirin 81 MG EC tablet Take 81 mg by mouth daily.        Marland Kitchen CALCIUM PO Take by mouth.      . cetirizine (ZYRTEC ALLERGY) 10 MG tablet Take 10 mg by mouth at bedtime.        . Cholecalciferol (VITAMIN D3) 2000 UNITS TABS Take 2,000 Units by mouth 2 (two) times daily.        . fish oil-omega-3 fatty acids 1000 MG capsule Take 2 capsules (2 g total) by mouth 2 (two) times daily. total for 4,000 mg      . GLUCOSAMINE SULFATE PO Take by mouth.      . meloxicam (MOBIC) 7.5 MG tablet TAKE ONE TABLET BY MOUTH TWICE DAILY  180 tablet  3  . Multiple Vitamin (MULTIVITAMIN) tablet Take 1 tablet by mouth daily.      Marland Kitchen QVAR 80 MCG/ACT inhaler INHALE TWO PUFFS EVERY DAY  24 g  1  . Red Yeast Rice 600 MG TABS  Take 1 tablet (600 mg total) by mouth 2 (two) times daily.        BP 124/80  Pulse 87  Temp 98.1 F (36.7 C) (Oral)  Wt 230 lb (104.327 kg)  SpO2 98%chart    Objective:   Physical Exam  Constitutional: She is oriented to person, place, and time. She appears well-developed and well-nourished.  HENT:  Right Ear: External ear normal.  Left Ear: External ear normal.  Nose: Nose normal.  Mouth/Throat: Oropharynx is clear and moist.  Cardiovascular: Normal rate, regular rhythm and normal heart sounds.   Pulmonary/Chest: Effort normal and breath sounds normal.  Neurological: She is alert and oriented to person, place, and time.  Skin: Skin is warm and dry.  Psychiatric: She has a normal mood and affect.          Assessment & Plan:  Assessment: Acute sinusitis, cough  Plan: Amoxicillin 500 mg 2 capsules by mouth twice a day x10 days. Continue over-the-counter Zyrtec. Offered  Flonase, patient declined. Patient call the office if symptoms worsen or persist. Recheck a schedule, when necessary.

## 2012-07-14 NOTE — Telephone Encounter (Signed)
Caller: Irisa/Patient; Patient Name: [name]; PCP: Darryll Capers (Adults only); Best Callback Phone Number: [phone]; Reason for call: Cough/Congestion. Was seen in office on 07/01/12 for yearly physical. Reports current symptoms of nasal congestion, productive cough with green mucus, mild headache at times, ear pain/pressure. Emergent symptom of "Productive cough with colored sputum" positive per Cough - Adult guideline. Disposition: See provider within 24 hours. Appointment scheduled with Adline Mango, FNP 07/14/12 at 3:15pm. Care advice and call back parameters given per guideline. Patient verbalized understanding.

## 2012-07-29 ENCOUNTER — Telehealth: Payer: Self-pay | Admitting: *Deleted

## 2012-07-29 NOTE — Telephone Encounter (Signed)
Patient called requesting a genetic appt and I confirmed 09/22/12 appt w/ her.

## 2012-07-29 NOTE — Telephone Encounter (Signed)
Received cancellation for genetic, so I called patient and offered appt to her.  Confirmed 07/31/12 genetic appt w/ pt.

## 2012-07-31 ENCOUNTER — Encounter: Payer: Self-pay | Admitting: Genetic Counselor

## 2012-07-31 ENCOUNTER — Ambulatory Visit (HOSPITAL_BASED_OUTPATIENT_CLINIC_OR_DEPARTMENT_OTHER): Payer: Medicare Other | Admitting: Genetic Counselor

## 2012-07-31 ENCOUNTER — Other Ambulatory Visit: Payer: Medicare Other

## 2012-07-31 DIAGNOSIS — Z8041 Family history of malignant neoplasm of ovary: Secondary | ICD-10-CM

## 2012-07-31 DIAGNOSIS — Z803 Family history of malignant neoplasm of breast: Secondary | ICD-10-CM

## 2012-07-31 DIAGNOSIS — IMO0002 Reserved for concepts with insufficient information to code with codable children: Secondary | ICD-10-CM

## 2012-07-31 NOTE — Progress Notes (Signed)
Dr.  Eda Paschal requested a consultation for genetic counseling and risk assessment for [name], a 66 y.o. female, for discussion of her family history of ovarian and breast cancer. She presents to clinic today to discuss the possibility of a genetic predisposition to cancer, and to further clarify her risks, as well as her family members' risks for cancer.   HISTORY OF PRESENT ILLNESS: [name] is a 66 y.o. female with no personal history of cancer.    Past Medical History  Diagnosis Date  . SHINGLES 11/08/2008  . HYPERLIPIDEMIA, BORDERLINE 02/09/2009  . Obesity, unspecified 08/06/2008  . VARICOSE VEINS LOWER EXTREMITIES W/INFLAMMATION 02/05/2008  . ASTHMATIC BRONCHITIS, ACUTE 01/29/2008  . ASTHMA UNSPECIFIED WITH EXACERBATION 11/08/2008  . OSTEOARTHRITIS 04/24/2007  . OSTEOPOROSIS 04/24/2007  . Chest pain 03/2011    negative stress test - Dr. Myrtis Ser  . ASCUS on Pap smear 2007  . Osteopenia     Past Surgical History  Procedure Date  . Knee arthroscopy   . Spine surgery 2008  . Colposcopy     Neg HR HPV  . Bone spur shoulder     History  Substance Use Topics  . Smoking status: Never Smoker   . Smokeless tobacco: Not on file  . Alcohol Use: 0.5 oz/week    1 drink(s) per week    REPRODUCTIVE HISTORY AND PERSONAL RISK ASSESSMENT FACTORS: Menarche was at age 72.   Menopause at 58 Uterus Intact: Yes Ovaries Intact: Yes G2P2A0 , first live birth at age 56  She has not previously undergone treatment for infertility.   OCP use for 4 years   She has not used HRT in the past.    FAMILY HISTORY:  We obtained a detailed, 4-generation family history.  Significant diagnoses are listed below: Family History  Problem Relation Age of Onset  . Ovarian cancer Mother   . Melanoma Mother   . Hypertension Father   . Heart disease Father   . Diabetes Paternal Aunt   . Heart disease Paternal Grandfather   . Cancer Maternal Grandmother     Oral cancer  . Down syndrome Sister     . Prostate cancer Maternal Uncle   . Heart disease Maternal Grandfather   . Prostate cancer Maternal Uncle   . Prostate cancer Maternal Uncle   . Breast cancer Cousin     two maternal cousins diagnosed <50  The patient's mother was diagnosed with ovarian cancer at age 76.  She was also diagnosed with melanoma earlier in life.  The patient had nine maternal aunts and uncles.  Three uncles had prostate cancer, and two of these uncles had one daughter each with breast cancer <50.  The patient's maternal grandmother was diagnosed with oral cancer in her late 10s.  There is no other reported cancer history.  Patient's maternal ancestors are of [place] and [place] descent, and paternal ancestors are of [place], [place], [place] and [place] descent. There is no reported Ashkenazi Jewish ancestry. There is no  known consanguinity.  GENETIC COUNSELING RISK ASSESSMENT, DISCUSSION, AND SUGGESTED FOLLOW UP: We reviewed the natural history and genetic etiology of sporadic, familial and hereditary cancer syndromes.  About 5-10% of breast cancer is hereditary.  Of this, about 85% is the result of a BRCA1 or BRCA2 mutation.  Additionally, about 1 in 8 women with ovarian cancer have a BRCA mutation.  We reviewed the red flags of hereditary cancer syndromes and the dominant inheritance patterns. We discussed the Medicare does  not pay for genetic testing for individuals who are unaffected with breast or ovarian cancer.  The patient's family history of ovarian, prostate and breast cancer is suggestive of the following possible diagnosis: hereditary cancer syndrome  We discussed that identification of a hereditary cancer syndrome may help her care providers tailor the patients medical management. If a mutation indicating a hereditary cancer syndrome is detected in this case, the [employer] recommendations would include increased cancer surveillance. If a mutation is detected, the patient will  be referred back to the referring provider and to any additional appropriate care providers to discuss the relevant options.   If a mutation is not found in the patient, cancer surveillance options would be discussed for the patient according to the appropriate standard National Comprehensive Cancer Network and American Cancer Society guidelines, with consideration of their personal and family history risk factors. In this case, the patient will be referred back to their care providers for discussions of management.   After considering the risks, benefits, and limitations, the patient decided to call her cancer insurance program to see if they cover genetic testing.  We will also look into whether her supplemental insurance would help pay for testing.   The patient was seen for a total of 60 minutes, greater than 50% of which was spent face-to-face counseling.  This plan is being carried out per Dr. Verl Dicker recommendations.  This note will also be sent to the referring provider via the electronic medical record. The patient will be supplied with a summary of this genetic counseling discussion as well as educational information on the discussed hereditary cancer syndromes following the conclusion of their visit.   Patient was discussed with Dr. Drue Second.   _______________________________________________________________________ For Office Staff:  Number of people involved in session: 2 Was an Intern/ student involved with case: no

## 2012-09-22 ENCOUNTER — Encounter: Payer: Medicare Other | Admitting: Genetic Counselor

## 2012-09-22 ENCOUNTER — Other Ambulatory Visit: Payer: Medicare Other | Admitting: Lab

## 2012-10-09 ENCOUNTER — Encounter: Payer: Self-pay | Admitting: Obstetrics and Gynecology

## 2012-10-24 ENCOUNTER — Other Ambulatory Visit (INDEPENDENT_AMBULATORY_CARE_PROVIDER_SITE_OTHER): Payer: Medicare Other

## 2012-10-24 DIAGNOSIS — E785 Hyperlipidemia, unspecified: Secondary | ICD-10-CM

## 2012-10-24 LAB — HEPATIC FUNCTION PANEL
Alkaline Phosphatase: 84 U/L (ref 39–117)
Bilirubin, Direct: 0 mg/dL (ref 0.0–0.3)
Total Bilirubin: 0.9 mg/dL (ref 0.3–1.2)
Total Protein: 7 g/dL (ref 6.0–8.3)

## 2012-10-24 LAB — LIPID PANEL
HDL: 59.9 mg/dL (ref >39.00)
Triglycerides: 67 mg/dL (ref 0.0–149.0)
VLDL: 13.4 mg/dL (ref 0.0–40.0)

## 2012-10-24 LAB — LDL CHOLESTEROL, DIRECT: Direct LDL: 149.5 mg/dL

## 2012-10-30 ENCOUNTER — Encounter: Payer: Self-pay | Admitting: Internal Medicine

## 2012-10-31 ENCOUNTER — Ambulatory Visit (INDEPENDENT_AMBULATORY_CARE_PROVIDER_SITE_OTHER): Payer: Medicare Other | Admitting: Internal Medicine

## 2012-10-31 ENCOUNTER — Encounter: Payer: Self-pay | Admitting: Internal Medicine

## 2012-10-31 VITALS — BP 130/80 | HR 72 | Temp 98.1°F | Resp 16 | Ht 64.0 in | Wt 231.0 lb

## 2012-10-31 DIAGNOSIS — T887XXA Unspecified adverse effect of drug or medicament, initial encounter: Secondary | ICD-10-CM

## 2012-10-31 DIAGNOSIS — E785 Hyperlipidemia, unspecified: Secondary | ICD-10-CM

## 2012-10-31 DIAGNOSIS — A499 Bacterial infection, unspecified: Secondary | ICD-10-CM

## 2012-10-31 DIAGNOSIS — J329 Chronic sinusitis, unspecified: Secondary | ICD-10-CM

## 2012-10-31 MED ORDER — LEVOFLOXACIN 500 MG PO TABS: 500.0000 mg | ORAL_TABLET | Freq: Every day | ORAL | Status: DC

## 2012-10-31 MED ORDER — ROSUVASTATIN CALCIUM 20 MG PO TABS: 20.0000 mg | ORAL_TABLET | Freq: Two times a day (BID) | ORAL | Status: DC

## 2012-10-31 NOTE — Patient Instructions (Signed)
For now let's stop the red rice yeast and start Crestor 20 mg on Monday night and Friday night

## 2012-10-31 NOTE — Progress Notes (Signed)
Subjective:    Patient ID: [name], female    DOB: 1946-06-16, 67 y.o.   MRN: [id number]  HPI Patient is a 67 year old female who presents for followup of hyperlipidemia she has been on red rice yeast for control of her cholesterol her liver functions are completely normal but her cholesterol is 232 total and an LDL cholesterol of 149.  Our goal for LDL is less than 100 so we discussed the institution of pulse Crestor on Monday and Friday and 20 mg.  The patient also has vasomotor rhinitis runny nose and postnasal drip and cough   Review of Systems  Constitutional: Negative for activity change, appetite change and fatigue.  HENT: Negative for ear pain, congestion, neck pain, postnasal drip and sinus pressure.   Eyes: Negative for redness and visual disturbance.  Respiratory: Negative for cough, shortness of breath and wheezing.   Gastrointestinal: Negative for abdominal pain and abdominal distention.  Genitourinary: Negative for dysuria, frequency and menstrual problem.  Musculoskeletal: Negative for myalgias, joint swelling and arthralgias.  Skin: Negative for rash and wound.  Neurological: Negative for dizziness, weakness and headaches.  Hematological: Negative for adenopathy. Does not bruise/bleed easily.  Psychiatric/Behavioral: Negative for sleep disturbance and decreased concentration.   Past Medical History  Diagnosis Date  . SHINGLES 11/08/2008  . HYPERLIPIDEMIA, BORDERLINE 02/09/2009  . Obesity, unspecified 08/06/2008  . VARICOSE VEINS LOWER EXTREMITIES W/INFLAMMATION 02/05/2008  . ASTHMATIC BRONCHITIS, ACUTE 01/29/2008  . ASTHMA UNSPECIFIED WITH EXACERBATION 11/08/2008  . OSTEOARTHRITIS 04/24/2007  . OSTEOPOROSIS 04/24/2007  . Chest pain 03/2011    negative stress test - Dr. Myrtis Ser  . ASCUS on Pap smear 2007  . Osteopenia     History   Social History  . Marital Status: Married    Spouse Name: N/A    Number of Children: N/A  . Years of Education: N/A    Occupational History  . Not on file.   Social History Main Topics  . Smoking status: Never Smoker   . Smokeless tobacco: Not on file  . Alcohol Use: 0.5 oz/week    1 drink(s) per week  . Drug Use: Not on file  . Sexually Active: Yes    Birth Control/ Protection: Post-menopausal   Other Topics Concern  . Not on file   Social History Narrative  . No narrative on file    Past Surgical History  Procedure Date  . Knee arthroscopy   . Spine surgery 2008  . Colposcopy     Neg HR HPV  . Bone spur shoulder     Family History  Problem Relation Age of Onset  . Ovarian cancer Mother   . Melanoma Mother   . Hypertension Father   . Heart disease Father   . Diabetes Paternal Aunt   . Heart disease Paternal Grandfather   . Cancer Maternal Grandmother     Oral cancer  . Down syndrome Sister   . Prostate cancer Maternal Uncle   . Heart disease Maternal Grandfather   . Prostate cancer Maternal Uncle   . Prostate cancer Maternal Uncle   . Breast cancer Cousin     two maternal cousins diagnosed <50    Allergies  Allergen Reactions  . Cefuroxime Axetil Diarrhea and Nausea And Vomiting    Current Outpatient Prescriptions on File Prior to Visit  Medication Sig Dispense Refill  . aspirin 81 MG EC tablet Take 81 mg by mouth daily.        Marland Kitchen CALCIUM PO Take by mouth.      Marland Kitchen  cetirizine (ZYRTEC ALLERGY) 10 MG tablet Take 10 mg by mouth at bedtime.        . Cholecalciferol (VITAMIN D3) 2000 UNITS TABS Take 2,000 Units by mouth 2 (two) times daily.        . fish oil-omega-3 fatty acids 1000 MG capsule Take 2 capsules (2 g total) by mouth 2 (two) times daily. total for 4,000 mg      . GLUCOSAMINE SULFATE PO Take by mouth.      . meloxicam (MOBIC) 7.5 MG tablet TAKE ONE TABLET BY MOUTH TWICE DAILY  180 tablet  3  . Multiple Vitamin (MULTIVITAMIN) tablet Take 1 tablet by mouth daily.      Marland Kitchen QVAR 80 MCG/ACT inhaler INHALE TWO PUFFS EVERY DAY  24 g  1  . rosuvastatin (CRESTOR) 20 MG  tablet Take 1 tablet (20 mg total) by mouth 2 (two) times daily.  90 tablet  3    BP 130/80  Pulse 72  Temp 98.1 F (36.7 C)  Resp 16  Ht 5\' 4"  (1.626 m)  Wt 231 lb (104.781 kg)  BMI 39.65 kg/m2       Objective:   Physical Exam  Constitutional: She is oriented to person, place, and time. She appears well-developed and well-nourished. No distress.  HENT:  Head: Normocephalic and atraumatic.       Inflamed erythematous sinus passages and turbinates with mucopurulent discharge from the frontal sinuses  Eyes: Conjunctivae normal and EOM are normal. Pupils are equal, round, and reactive to light.  Neck: Normal range of motion. Neck supple. No JVD present. No tracheal deviation present. No thyromegaly present.  Cardiovascular: Normal rate, regular rhythm, normal heart sounds and intact distal pulses.   No murmur heard. Pulmonary/Chest: Effort normal and breath sounds normal. She has no wheezes. She exhibits no tenderness.  Abdominal: Soft. Bowel sounds are normal.  Musculoskeletal: Normal range of motion. She exhibits no edema and no tenderness.  Lymphadenopathy:    She has no cervical adenopathy.  Neurological: She is alert and oriented to person, place, and time. She has normal reflexes. No cranial nerve deficit.  Skin: Skin is warm and dry. She is not diaphoretic. There is erythema.  Psychiatric: She has a normal mood and affect. Her behavior is normal.          Assessment & Plan:  Examination is consistent with bacterial sinusitis We'll call in azithromycin Give her samples of Crestor 20 mg for a trial for 4 months of Crestor twice Lipid and liver will be drawn prior to the next office visit

## 2012-11-17 ENCOUNTER — Telehealth: Payer: Self-pay | Admitting: Internal Medicine

## 2012-11-17 NOTE — Telephone Encounter (Signed)
Patient Information:  Caller Name: Cherine  Phone: [phone]  Patient: Female, [name]  Gender: Female  DOB: 1945-11-07  Age: 67 Years  PCP: Darryll Capers (Adults only)  Office Follow Up:  Does the office need to follow up with this patient?: Yes  Instructions For The Office: No appointments avaialbe in EPIC. Has see today  disposition due to earache.   Symptoms  Reason For Call & Symptoms: Aurorah states she had onset of congestion and cough on 11/13/12 with possible low grade fever on 1/30 and 1/31.  Has no thermometer. Was seen in office on 10/31/12 - diagnosed bacterial sinusitus and ordered Levaquin which she has completed. C/o post nasal drainage. Head  "feels swollen and has pressure behind her ears. "  Equilibrium was off on 11/15/12." Yellow green nasal drainage.  Chest pain  with cough. No appointmnets available in EPIC. Please call [facility]. No appointments  Reviewed Health History In EMR: Yes  Reviewed Medications In EMR: Yes  Reviewed Allergies In EMR: Yes  Reviewed Surgeries / Procedures: Yes  Date of Onset of Symptoms: 11/13/2012  Treatments Tried: Saline nasal spray. Po fluids.  Treatments Tried Worked: No  Guideline(s) Used:  Cough  Sinus Pain and Congestion  Earache  Disposition Per Guideline:   See Today in Office  Reason For Disposition Reached:   Earache  Advice Given:  Reassurance  Coughing is the way that our lungs remove irritants and mucus. It helps protect our lungs from getting pneumonia.  You can get a dry hacking cough after a chest cold. Sometimes this type of cough can last 1-3 weeks, and be worse at night.  You can also get a cough after being exposed to irritating substances like smoke, strong perfumes, and dust.  Cough Medicines:  OTC Cough Drops: Cough drops can help a lot, especially for mild coughs. They reduce coughing by soothing your irritated throat and removing that tickle sensation in the back of the throat. Cough drops also have the  advantage of portability - you can carry them with you.  Cough Medicines:  OTC Cough Drops: Cough drops can help a lot, especially for mild coughs. They reduce coughing by soothing your irritated throat and removing that tickle sensation in the back of the throat. Cough drops also have the advantage of portability - you can carry them with you.  Home Remedy - Hard Candy: Hard candy works just as well as medicine-flavored OTC cough drops. Diabetics should use sugar-free candy.  Home Remedy - Honey: This old home remedy has been shown to help decrease coughing at night. The adult dosage is 2 teaspoons (10 ml) at bedtime. Honey should not be given to infants under one year of age.  Call Back If:  Difficulty breathing  Cough lasts more than 3 weeks  Fever lasts > 3 days  You become worse.  Reassurance:   Sinus congestion is a normal part of a cold.  Usually home treatment with nasal washes can prevent an actual bacterial sinus infection.  For a Stuffy Nose - Use Nasal Washes:  Introduction: Saline (salt water) nasal irrigation (nasal wash) is an effective and simple home remedy for treating stuffy nose and sinus congestion. The nose can be irrigated by pouring, spraying, or squirting salt water into the nose and then letting it run back out.  Pain and Fever Medicines:  For pain or fever relief, take either acetaminophen or ibuprofen.  Hydration:  Drink plenty of liquids (6-8 glasses of water daily). If  the air in your home is dry, use a cool mist humidifier  Call Back If:   Sinus pain lasts longer than 1 day after starting treatment using nasal washes  Sinus congestion (fullness) lasts longer than 10 days  Fever lasts longer than 3 days

## 2012-11-17 NOTE — Telephone Encounter (Signed)
Please schedule an appt with someone tomorrow. Thanks

## 2012-11-18 ENCOUNTER — Encounter: Payer: Self-pay | Admitting: Family

## 2012-11-18 ENCOUNTER — Ambulatory Visit (INDEPENDENT_AMBULATORY_CARE_PROVIDER_SITE_OTHER): Payer: Medicare Other | Admitting: Family

## 2012-11-18 VITALS — BP 122/84 | HR 55 | Temp 98.5°F | Ht 64.0 in | Wt 228.0 lb

## 2012-11-18 DIAGNOSIS — H6980 Other specified disorders of Eustachian tube, unspecified ear: Secondary | ICD-10-CM

## 2012-11-18 DIAGNOSIS — R05 Cough: Secondary | ICD-10-CM

## 2012-11-18 DIAGNOSIS — J019 Acute sinusitis, unspecified: Secondary | ICD-10-CM

## 2012-11-18 DIAGNOSIS — H6990 Unspecified Eustachian tube disorder, unspecified ear: Secondary | ICD-10-CM

## 2012-11-18 DIAGNOSIS — R059 Cough, unspecified: Secondary | ICD-10-CM

## 2012-11-18 MED ORDER — FLUTICASONE PROPIONATE 50 MCG/ACT NA SUSP: 2.0000 | Freq: Every day | NASAL | Status: DC

## 2012-11-18 MED ORDER — AMOXICILLIN 500 MG PO TABS: 1000.0000 mg | ORAL_TABLET | Freq: Two times a day (BID) | ORAL | Status: AC

## 2012-11-18 NOTE — Progress Notes (Signed)
  Subjective:    Patient ID: [name], female    DOB: Aug 05, 1946, 67 y.o.   MRN: [id number]  HPI    Review of Systems     Objective:   Physical Exam  HENT:       Sinus tenderness to palpation of the maxillary sinuses bilaterally.          Assessment & Plan:

## 2012-11-18 NOTE — Patient Instructions (Addendum)

## 2012-11-18 NOTE — Progress Notes (Signed)
Subjective:    Patient ID: [name], female    DOB: 03-Oct-1946, 67 y.o.   MRN: [id number]  HPI 67 year old white female, nonsmoker, patient of Dr. Lovell Sheehan is in today with complaints of cough, sinus pressure, ear pain, headache x4 days and worsening. She's been using over-the-counter saline with no relief. Husband is here sick with bronchitis.   Review of Systems  Constitutional: Negative.   HENT: Positive for congestion, sore throat, rhinorrhea, postnasal drip and sinus pressure.   Respiratory: Positive for cough.   Cardiovascular: Negative.   Musculoskeletal: Negative.   Skin: Negative.   Hematological: Negative.   Psychiatric/Behavioral: Negative.    Past Medical History  Diagnosis Date  . SHINGLES 11/08/2008  . HYPERLIPIDEMIA, BORDERLINE 02/09/2009  . Obesity, unspecified 08/06/2008  . VARICOSE VEINS LOWER EXTREMITIES W/INFLAMMATION 02/05/2008  . ASTHMATIC BRONCHITIS, ACUTE 01/29/2008  . ASTHMA UNSPECIFIED WITH EXACERBATION 11/08/2008  . OSTEOARTHRITIS 04/24/2007  . OSTEOPOROSIS 04/24/2007  . Chest pain 03/2011    negative stress test - Dr. Myrtis Ser  . ASCUS on Pap smear 2007  . Osteopenia     History   Social History  . Marital Status: Married    Spouse Name: N/A    Number of Children: N/A  . Years of Education: N/A   Occupational History  . Not on file.   Social History Main Topics  . Smoking status: Never Smoker   . Smokeless tobacco: Not on file  . Alcohol Use: 0.5 oz/week    1 drink(s) per week  . Drug Use: Not on file  . Sexually Active: Yes    Birth Control/ Protection: Post-menopausal   Other Topics Concern  . Not on file   Social History Narrative  . No narrative on file    Past Surgical History  Procedure Date  . Knee arthroscopy   . Spine surgery 2008  . Colposcopy     Neg HR HPV  . Bone spur shoulder     Family History  Problem Relation Age of Onset  . Ovarian cancer Mother   . Melanoma Mother   . Hypertension Father   . Heart  disease Father   . Diabetes Paternal Aunt   . Heart disease Paternal Grandfather   . Cancer Maternal Grandmother     Oral cancer  . Down syndrome Sister   . Prostate cancer Maternal Uncle   . Heart disease Maternal Grandfather   . Prostate cancer Maternal Uncle   . Prostate cancer Maternal Uncle   . Breast cancer Cousin     two maternal cousins diagnosed <50    Allergies  Allergen Reactions  . Cefuroxime Axetil Diarrhea and Nausea And Vomiting    Current Outpatient Prescriptions on File Prior to Visit  Medication Sig Dispense Refill  . aspirin 81 MG EC tablet Take 81 mg by mouth daily.        Marland Kitchen CALCIUM PO Take by mouth.      . cetirizine (ZYRTEC ALLERGY) 10 MG tablet Take 10 mg by mouth at bedtime.        . Cholecalciferol (VITAMIN D3) 2000 UNITS TABS Take 2,000 Units by mouth 2 (two) times daily.        . fish oil-omega-3 fatty acids 1000 MG capsule Take 2 capsules (2 g total) by mouth 2 (two) times daily. total for 4,000 mg      . GLUCOSAMINE SULFATE PO Take by mouth.      . meloxicam (MOBIC) 7.5 MG tablet TAKE ONE TABLET BY MOUTH  TWICE DAILY  180 tablet  3  . Multiple Vitamin (MULTIVITAMIN) tablet Take 1 tablet by mouth daily.      Marland Kitchen QVAR 80 MCG/ACT inhaler INHALE TWO PUFFS EVERY DAY  24 g  1  . rosuvastatin (CRESTOR) 20 MG tablet Take 20 mg by mouth 2 (two) times a week.      . fluticasone (FLONASE) 50 MCG/ACT nasal spray Place 2 sprays into the nose daily.  16 g  6  . levofloxacin (LEVAQUIN) 500 MG tablet Take 1 tablet (500 mg total) by mouth daily.  7 tablet  0    BP 122/84  Pulse 55  Temp 98.5 F (36.9 C) (Oral)  Ht 5\' 4"  (1.626 m)  Wt 228 lb (103.42 kg)  BMI 39.14 kg/m2  SpO2 98%chart     Objective:   Physical Exam  Constitutional: She appears well-developed and well-nourished.  HENT:  Right Ear: External ear normal.  Left Ear: External ear normal.  Nose: Nose normal.  Mouth/Throat: Oropharynx is clear and moist.  Neck: Normal range of motion. Neck supple.   Cardiovascular: Normal rate, regular rhythm and normal heart sounds.   Pulmonary/Chest: Effort normal and breath sounds normal.  Neurological: She is alert.  Skin: Skin is warm and dry.  Psychiatric: She has a normal mood and affect.          Assessment & Plan:  Assessment:  1. Acute sinusitis 2. Eustachian tube dysfunction 3. Cough  Plan: Flonase 2 sprays in each nostril once a day. Amoxicillin 500 mg 2 capsules by mouth twice a day x10 days. Rest. Drink plenty of fluids. Patient to call the office if symptoms worsen or persist. Recheck a schedule, and as needed.

## 2012-11-18 NOTE — Telephone Encounter (Signed)
appt scheduled

## 2013-01-28 ENCOUNTER — Other Ambulatory Visit (INDEPENDENT_AMBULATORY_CARE_PROVIDER_SITE_OTHER): Payer: Medicare Other

## 2013-01-28 DIAGNOSIS — T887XXA Unspecified adverse effect of drug or medicament, initial encounter: Secondary | ICD-10-CM

## 2013-01-28 DIAGNOSIS — E785 Hyperlipidemia, unspecified: Secondary | ICD-10-CM

## 2013-01-28 LAB — HEPATIC FUNCTION PANEL
ALT: 19 U/L (ref 0–35)
Alkaline Phosphatase: 64 U/L (ref 39–117)
Bilirubin, Direct: 0.1 mg/dL (ref 0.0–0.3)
Total Bilirubin: 0.8 mg/dL (ref 0.3–1.2)

## 2013-01-28 LAB — LIPID PANEL: VLDL: 7.4 mg/dL (ref 0.0–40.0)

## 2013-02-04 ENCOUNTER — Ambulatory Visit (INDEPENDENT_AMBULATORY_CARE_PROVIDER_SITE_OTHER): Payer: Medicare Other | Admitting: Internal Medicine

## 2013-02-04 ENCOUNTER — Encounter: Payer: Self-pay | Admitting: Internal Medicine

## 2013-02-04 VITALS — BP 120/74 | HR 72 | Temp 98.1°F | Resp 16 | Ht 64.5 in | Wt 219.0 lb

## 2013-02-04 DIAGNOSIS — Z713 Dietary counseling and surveillance: Secondary | ICD-10-CM

## 2013-02-04 DIAGNOSIS — E785 Hyperlipidemia, unspecified: Secondary | ICD-10-CM

## 2013-02-04 DIAGNOSIS — J45909 Unspecified asthma, uncomplicated: Secondary | ICD-10-CM

## 2013-02-04 NOTE — Patient Instructions (Addendum)
Practical paleo

## 2013-02-04 NOTE — Progress Notes (Signed)
Subjective:    Patient ID: [name], female    DOB: 22-Sep-1946, 67 y.o.   MRN: [id number]  HPI Has been dieting and lost weight Lipid management visit for crestor 20 BID Has been on paleo diet    Review of Systems  Constitutional: Negative for activity change, appetite change and fatigue.  HENT: Negative for ear pain, congestion, neck pain, postnasal drip and sinus pressure.   Eyes: Negative for redness and visual disturbance.  Respiratory: Negative for cough, shortness of breath and wheezing.   Gastrointestinal: Negative for abdominal pain and abdominal distention.  Genitourinary: Negative for dysuria, frequency and menstrual problem.  Musculoskeletal: Negative for myalgias, joint swelling and arthralgias.  Skin: Negative for rash and wound.  Neurological: Negative for dizziness, weakness and headaches.  Hematological: Negative for adenopathy. Does not bruise/bleed easily.  Psychiatric/Behavioral: Negative for sleep disturbance and decreased concentration.   Past Medical History  Diagnosis Date  . SHINGLES 11/08/2008  . HYPERLIPIDEMIA, BORDERLINE 02/09/2009  . Obesity, unspecified 08/06/2008  . VARICOSE VEINS LOWER EXTREMITIES W/INFLAMMATION 02/05/2008  . ASTHMATIC BRONCHITIS, ACUTE 01/29/2008  . ASTHMA UNSPECIFIED WITH EXACERBATION 11/08/2008  . OSTEOARTHRITIS 04/24/2007  . OSTEOPOROSIS 04/24/2007  . Chest pain 03/2011    negative stress test - Dr. Myrtis Ser  . ASCUS on Pap smear 2007  . Osteopenia     History   Social History  . Marital Status: Married    Spouse Name: N/A    Number of Children: N/A  . Years of Education: N/A   Occupational History  . Not on file.   Social History Main Topics  . Smoking status: Never Smoker   . Smokeless tobacco: Not on file  . Alcohol Use: 0.5 oz/week    1 drink(s) per week  . Drug Use: Not on file  . Sexually Active: Yes    Birth Control/ Protection: Post-menopausal   Other Topics Concern  . Not on file   Social History  Narrative  . No narrative on file    Past Surgical History  Procedure Laterality Date  . Knee arthroscopy    . Spine surgery  2008  . Colposcopy      Neg HR HPV  . Bone spur shoulder      Family History  Problem Relation Age of Onset  . Ovarian cancer Mother   . Melanoma Mother   . Hypertension Father   . Heart disease Father   . Diabetes Paternal Aunt   . Heart disease Paternal Grandfather   . Cancer Maternal Grandmother     Oral cancer  . Down syndrome Sister   . Prostate cancer Maternal Uncle   . Heart disease Maternal Grandfather   . Prostate cancer Maternal Uncle   . Prostate cancer Maternal Uncle   . Breast cancer Cousin     two maternal cousins diagnosed <50    Allergies  Allergen Reactions  . Cefuroxime Axetil Diarrhea and Nausea And Vomiting    Current Outpatient Prescriptions on File Prior to Visit  Medication Sig Dispense Refill  . aspirin 81 MG EC tablet Take 81 mg by mouth daily.        Marland Kitchen CALCIUM PO Take by mouth.      . cetirizine (ZYRTEC ALLERGY) 10 MG tablet Take 10 mg by mouth at bedtime.        . Cholecalciferol (VITAMIN D3) 2000 UNITS TABS Take 2,000 Units by mouth 2 (two) times daily.        . fish oil-omega-3 fatty acids 1000  MG capsule Take 2 capsules (2 g total) by mouth 2 (two) times daily. total for 4,000 mg      . GLUCOSAMINE SULFATE PO Take by mouth.      . meloxicam (MOBIC) 7.5 MG tablet TAKE ONE TABLET BY MOUTH TWICE DAILY  180 tablet  3  . Multiple Vitamin (MULTIVITAMIN) tablet Take 1 tablet by mouth daily.      Marland Kitchen QVAR 80 MCG/ACT inhaler INHALE TWO PUFFS EVERY DAY  24 g  1  . rosuvastatin (CRESTOR) 20 MG tablet Take 20 mg by mouth 2 (two) times a week.       No current facility-administered medications on file prior to visit.    BP 120/74  Pulse 72  Temp(Src) 98.1 F (36.7 C)  Resp 16  Ht 5' 4.5" (1.638 m)  Wt 219 lb (99.338 kg)  BMI 37.02 kg/m2        Objective:   Physical Exam  Nursing note and vitals  reviewed. Constitutional: She is oriented to person, place, and time. She appears well-developed and well-nourished. No distress.  HENT:  Head: Normocephalic and atraumatic.  Right Ear: External ear normal.  Left Ear: External ear normal.  Nose: Nose normal.  Mouth/Throat: Oropharynx is clear and moist.  Eyes: Conjunctivae and EOM are normal. Pupils are equal, round, and reactive to light.  Neck: Normal range of motion. Neck supple. No JVD present. No tracheal deviation present. No thyromegaly present.  Cardiovascular: Normal rate, regular rhythm, normal heart sounds and intact distal pulses.   No murmur heard. Pulmonary/Chest: Effort normal and breath sounds normal. She has no wheezes. She exhibits no tenderness.  Abdominal: Soft. Bowel sounds are normal.  Musculoskeletal: Normal range of motion. She exhibits no edema and no tenderness.  Lymphadenopathy:    She has no cervical adenopathy.  Neurological: She is alert and oriented to person, place, and time. She has normal reflexes. No cranial nerve deficit.  Skin: Skin is warm and dry. She is not diaphoretic.  Psychiatric: She has a normal mood and affect. Her behavior is normal.          Assessment & Plan:  Patient has achieved the breasts lipid profile she has ever had with a total cholesterol well below 170 and an LDL below 100.  Asthma is stable despite seasonal pollen counts being high avoidance and use of maintenance medications recommended.  And most importantly the patient has lost significant weight and is following a modified diet we will give her information about the paleo diet

## 2013-02-20 ENCOUNTER — Other Ambulatory Visit: Payer: Self-pay | Admitting: Internal Medicine

## 2013-06-08 ENCOUNTER — Other Ambulatory Visit: Payer: Self-pay | Admitting: *Deleted

## 2013-06-08 ENCOUNTER — Ambulatory Visit (INDEPENDENT_AMBULATORY_CARE_PROVIDER_SITE_OTHER): Payer: Medicare Other | Admitting: Internal Medicine

## 2013-06-08 ENCOUNTER — Encounter: Payer: Self-pay | Admitting: Internal Medicine

## 2013-06-08 VITALS — BP 128/80 | HR 72 | Temp 98.0°F | Resp 16 | Ht 64.5 in | Wt 218.0 lb

## 2013-06-08 DIAGNOSIS — I8310 Varicose veins of unspecified lower extremity with inflammation: Secondary | ICD-10-CM

## 2013-06-08 DIAGNOSIS — Z23 Encounter for immunization: Secondary | ICD-10-CM

## 2013-06-08 DIAGNOSIS — E785 Hyperlipidemia, unspecified: Secondary | ICD-10-CM

## 2013-06-08 DIAGNOSIS — J45909 Unspecified asthma, uncomplicated: Secondary | ICD-10-CM

## 2013-06-08 MED ORDER — MELOXICAM 7.5 MG PO TABS: ORAL_TABLET | ORAL | Status: DC

## 2013-06-08 NOTE — Progress Notes (Signed)
  Subjective:    Patient ID: [name], female    DOB: [birth year], 67 y.o.   MRN: [id number]  HPI The patient has  been on a long trip and has noted increased leg swelling and inflammation of varicosities Asthma stable Lipid management on pulse therapy    Review of Systems  Constitutional: Negative for activity change, appetite change and fatigue.  HENT: Negative for ear pain, congestion, neck pain, postnasal drip and sinus pressure.   Eyes: Negative for redness and visual disturbance.  Respiratory: Negative for cough, shortness of breath and wheezing.   Gastrointestinal: Negative for abdominal pain and abdominal distention.  Genitourinary: Negative for dysuria, frequency and menstrual problem.  Musculoskeletal: Negative for myalgias, joint swelling and arthralgias.  Skin: Negative for rash and wound.  Neurological: Negative for dizziness, weakness and headaches.  Hematological: Negative for adenopathy. Does not bruise/bleed easily.  Psychiatric/Behavioral: Negative for sleep disturbance and decreased concentration.       Objective:   Physical Exam  Constitutional: She is oriented to person, place, and time. She appears well-developed and well-nourished. No distress.  HENT:  Head: Normocephalic and atraumatic.  Purple swollen turbinates  Eyes: Conjunctivae and EOM are normal. Pupils are equal, round, and reactive to light.  Neck: Normal range of motion. Neck supple. No JVD present. No tracheal deviation present. No thyromegaly present.  Cardiovascular: Normal rate, regular rhythm, normal heart sounds and intact distal pulses.   No murmur heard. Pulmonary/Chest: Effort normal and breath sounds normal. She has no wheezes. She exhibits no tenderness.  Abdominal: Soft. Bowel sounds are normal.  Musculoskeletal: Normal range of motion. She exhibits no edema and no tenderness.  Lymphadenopathy:    She has no cervical adenopathy.  Neurological: She is alert and oriented to person,  place, and time. She has normal reflexes. No cranial nerve deficit.  Skin: She is not diaphoretic.  Pronounced varicosities  Psychiatric: She has a normal mood and affect. Her behavior is normal.          Assessment & Plan:  Due pap and mammogram Has lesion removed from ear by dermatology Osteopenia on vit d Stable asthma Flair of superficial phlebitis

## 2013-06-08 NOTE — Addendum Note (Signed)
Addended by: Willy Eddy on: 06/08/2013 04:51 PM   Modules accepted: Orders

## 2013-07-08 ENCOUNTER — Other Ambulatory Visit (HOSPITAL_COMMUNITY)
Admission: RE | Admit: 2013-07-08 | Discharge: 2013-07-08 | Disposition: A | Discharge status: Home / Self Care | Payer: Medicare Other | Source: Ambulatory Visit | Attending: Gynecology | Admitting: Gynecology

## 2013-07-08 ENCOUNTER — Encounter: Payer: Self-pay | Admitting: Women's Health

## 2013-07-08 ENCOUNTER — Ambulatory Visit (INDEPENDENT_AMBULATORY_CARE_PROVIDER_SITE_OTHER): Payer: Medicare Other | Admitting: Women's Health

## 2013-07-08 VITALS — BP 118/74 | Ht 64.25 in | Wt 217.6 lb

## 2013-07-08 DIAGNOSIS — Z01419 Encounter for gynecological examination (general) (routine) without abnormal findings: Secondary | ICD-10-CM | POA: Insufficient documentation

## 2013-07-08 DIAGNOSIS — Z124 Encounter for screening for malignant neoplasm of cervix: Secondary | ICD-10-CM

## 2013-07-08 DIAGNOSIS — R109 Unspecified abdominal pain: Secondary | ICD-10-CM

## 2013-07-08 NOTE — Patient Instructions (Signed)
Health Recommendations for Postmenopausal Women Respected and ongoing research has looked at the most common causes of death, disability, and poor quality of life in postmenopausal women. The causes include heart disease, diseases of blood vessels, diabetes, depression, cancer, and bone loss (osteoporosis). Many things can be done to help lower the chances of developing these and other common problems: CARDIOVASCULAR DISEASE Heart Disease: A heart attack is a medical emergency. Know the signs and symptoms of a heart attack. Below are things women can do to reduce their risk for heart disease.   Do not smoke. If you smoke, quit.  Aim for a healthy weight. Being overweight causes many preventable deaths. Eat a healthy and balanced diet and drink an adequate amount of liquids.  Get moving. Make a commitment to be more physically active. Aim for 30 minutes of activity on most, if not all days of the week.  Eat for heart health. Choose a diet that is low in saturated fat and cholesterol and eliminate trans fat. Include whole grains, vegetables, and fruits. Read and understand the labels on food containers before buying.  Know your numbers. Ask your caregiver to check your blood pressure, cholesterol (total, HDL, LDL, triglycerides) and blood glucose. Work with your caregiver on improving your entire clinical picture.  High blood pressure. Limit or stop your table salt intake (try salt substitute and food seasonings). Avoid salty foods and drinks. Read labels on food containers before buying. Eating well and exercising can help control high blood pressure. STROKE  Stroke is a medical emergency. Stroke may be the result of a blood clot in a blood vessel in the brain or by a brain hemorrhage (bleeding). Know the signs and symptoms of a stroke. To lower the risk of developing a stroke:  Avoid fatty foods.  Quit smoking.  Control your diabetes, blood pressure, and irregular heart rate. THROMBOPHLEBITIS  (BLOOD CLOT) OF THE LEG  Becoming overweight and leading a stationary lifestyle may also contribute to developing blood clots. Controlling your diet and exercising will help lower the risk of developing blood clots. CANCER SCREENING  Breast Cancer: Take steps to reduce your risk of breast cancer.  You should practice "breast self-awareness." This means understanding the normal appearance and feel of your breasts and should include breast self-examination. Any changes detected, no matter how small, should be reported to your caregiver.  After age 40, you should have a clinical breast exam (CBE) every year.  Starting at age 40, you should consider having a mammogram (breast X-ray) every year.  If you have a family history of breast cancer, talk to your caregiver about genetic screening.  If you are at high risk for breast cancer, talk to your caregiver about having an MRI and a mammogram every year.  Intestinal or Stomach Cancer: Tests to consider are a rectal exam, fecal occult blood, sigmoidoscopy, and colonoscopy. Women who are high risk may need to be screened at an earlier age and more often.  Cervical Cancer:  Beginning at age 30, you should have a Pap test every 3 years as long as the past 3 Pap tests have been normal.  If you have had past treatment for cervical cancer or a condition that could lead to cancer, you need Pap tests and screening for cancer for at least 20 years after your treatment.  If you had a hysterectomy for a problem that was not cancer or a condition that could lead to cancer, then you no longer need Pap tests.    If you are between ages 65 and 70, and you have had normal Pap tests going back 10 years, you no longer need Pap tests.  If Pap tests have been discontinued, risk factors (such as a new sexual partner) need to be reassessed to determine if screening should be resumed.  Some medical problems can increase the chance of getting cervical cancer. In these  cases, your caregiver may recommend more frequent screening and Pap tests.  Uterine Cancer: If you have vaginal bleeding after reaching menopause, you should notify your caregiver.  Ovarian cancer: Other than yearly pelvic exams, there are no reliable tests available to screen for ovarian cancer at this time except for yearly pelvic exams.  Lung Cancer: Yearly chest X-rays can detect lung cancer and should be done on high risk women, such as cigarette smokers and women with chronic lung disease (emphysema).  Skin Cancer: A complete body skin exam should be done at your yearly examination. Avoid overexposure to the sun and ultraviolet light lamps. Use a strong sun block cream when in the sun. All of these things are important in lowering the risk of skin cancer. MENOPAUSE Menopause Symptoms: Hormone therapy products are effective for treating symptoms associated with menopause:  Moderate to severe hot flashes.  Night sweats.  Mood swings.  Headaches.  Tiredness.  Loss of sex drive.  Insomnia.  Other symptoms. Hormone replacement carries certain risks, especially in older women. Women who use or are thinking about using estrogen or estrogen with progestin treatments should discuss that with their caregiver. Your caregiver will help you understand the benefits and risks. The ideal dose of hormone replacement therapy is not known. The Food and Drug Administration (FDA) has concluded that hormone therapy should be used only at the lowest doses and for the shortest amount of time to reach treatment goals.  OSTEOPOROSIS Protecting Against Bone Loss and Preventing Fracture: If you use hormone therapy for prevention of bone loss (osteoporosis), the risks for bone loss must outweigh the risk of the therapy. Ask your caregiver about other medications known to be safe and effective for preventing bone loss and fractures. To guard against bone loss or fractures, the following is recommended:  If  you are less than age 50, take 1000 mg of calcium and at least 600 mg of Vitamin D per day.  If you are greater than age 50 but less than age 70, take 1200 mg of calcium and at least 600 mg of Vitamin D per day.  If you are greater than age 70, take 1200 mg of calcium and at least 800 mg of Vitamin D per day. Smoking and excessive alcohol intake increases the risk of osteoporosis. Eat foods rich in calcium and vitamin D and do weight bearing exercises several times a week as your caregiver suggests. DIABETES Diabetes Melitus: If you have Type I or Type 2 diabetes, you should keep your blood sugar under control with diet, exercise and recommended medication. Avoid too many sweets, starchy and fatty foods. Being overweight can make control more difficult. COGNITION AND MEMORY Cognition and Memory: Menopausal hormone therapy is not recommended for the prevention of cognitive disorders such as Alzheimer's disease or memory loss.  DEPRESSION  Depression may occur at any age, but is common in elderly women. The reasons may be because of physical, medical, social (loneliness), or financial problems and needs. If you are experiencing depression because of medical problems and control of symptoms, talk to your caregiver about this. Physical activity and   exercise may help with mood and sleep. Community and volunteer involvement may help your sense of value and worth. If you have depression and you feel that the problem is getting worse or becoming severe, talk to your caregiver about treatment options that are best for you. ACCIDENTS  Accidents are common and can be serious in the elderly woman. Prepare your house to prevent accidents. Eliminate throw rugs, place hand bars in the bath, shower and toilet areas. Avoid wearing high heeled shoes or walking on wet, snowy, and icy areas. Limit or stop driving if you have vision or hearing problems, or you feel you are unsteady with you movements and  reflexes. HEPATITIS C Hepatitis C is a type of viral infection affecting the liver. It is spread mainly through contact with blood from an infected person. It can be treated, but if left untreated, it can lead to severe liver damage over years. Many people who are infected do not know that the virus is in their blood. If you are a "baby-boomer", it is recommended that you have one screening test for Hepatitis C. IMMUNIZATIONS  Several immunizations are important to consider having during your senior years, including:   Tetanus, diptheria, and pertussis booster shot.  Influenza every year before the flu season begins.  Pneumonia vaccine.  Shingles vaccine.  Others as indicated based on your specific needs. Talk to your caregiver about these. Document Released: 11/23/2005 Document Revised: 09/17/2012 Document Reviewed: 07/19/2008 [employer] Patient Information 2014 [employer].  

## 2013-07-08 NOTE — Progress Notes (Addendum)
[name] [birth year] [id number]    History:    The patient presents for breast and pelvic exam. Has annual exam scheduled with primary care for pnuemovac and zostavac. Primary care manages hypercholesterolemia. Osteopenia managed by orthopedist. Normal Paps until 2007, ascus twice with negative HR HPV and negative C&B.  Was contemplating BRCA testing but did not have due to cost. Mother had ovarian cancer died at age 66,  2 maternal cousins with breast cancer (has 67 first cousins). Normal mammogram history. Declines colonoscopy.  Past medical history, past surgical history, family history and social history were all reviewed and documented in the EPIC chart. 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 2 grown children both doing well.   Exam:  Filed Vitals:   07/08/13 1016  BP: 118/74    General appearance:  Normal Head/Neck:  Normal, without cervical or supraclavicular adenopathy. Thyroid:  Symmetrical, normal in size, without palpable masses or nodularity. Respiratory  Effort:  Normal  Auscultation:  Clear without wheezing or rhonchi Cardiovascular  Auscultation:  Regular rate, without rubs, murmurs or gallops  Edema/varicosities:  Not grossly evident Abdominal  Soft,nontender, without masses, guarding or rebound.  Liver/spleen:  No organomegaly noted  Hernia:  None appreciated  Skin  Inspection:  Grossly normal  Palpation:  Grossly normal Neurologic/psychiatric  Orientation:  Normal with appropriate conversation.  Mood/affect:  Normal  Genitourinary    Breasts: Examined lying and sitting.     Right: Without masses, retractions, discharge or axillary adenopathy.     Left: Without masses, retractions, discharge or axillary adenopathy.   Inguinal/mons:  Normal without inguinal adenopathy  External genitalia:  Normal  BUS/Urethra/Skene's glands:  Normal  Bladder:  Normal  Vagina:  Normal  Cervix:  Normal  Uterus:   normal in size, shape and contour.  Midline and  mobile  Adnexa/parametria:     Rt: Without masses or tenderness.   Lt: Without masses or tenderness.  Anus and perineum: Normal  Digital rectal exam: Normal sphincter tone without palpated masses or tenderness  Assessment/Plan:  67 y.o. MWF G2P2 for stem pelvic exam with no complaint of intermittent low abdominal cramps  Postmenopausal no HRT with no bleeding Hypercholesteremia-primary care manages Osteopenia-orthopedist manages  Pap, Pap normal 2012, new screening guidelines reviewed. SBE's, continue annual mammogram, calcium rich diet, vitamin D 2000 daily encouraged. Home safety and fall prevention discussed. Reviewed importance of screening colonoscopy/declines. Schedule ultrasound.    Harrington Challenger WHNP, 1:23 PM 07/08/2013

## 2013-07-10 ENCOUNTER — Ambulatory Visit (INDEPENDENT_AMBULATORY_CARE_PROVIDER_SITE_OTHER): Payer: Medicare Other

## 2013-07-10 ENCOUNTER — Ambulatory Visit (INDEPENDENT_AMBULATORY_CARE_PROVIDER_SITE_OTHER): Payer: Medicare Other | Admitting: Women's Health

## 2013-07-10 ENCOUNTER — Encounter: Payer: Self-pay | Admitting: Women's Health

## 2013-07-10 DIAGNOSIS — R109 Unspecified abdominal pain: Secondary | ICD-10-CM

## 2013-07-10 DIAGNOSIS — N949 Unspecified condition associated with female genital organs and menstrual cycle: Secondary | ICD-10-CM

## 2013-07-10 DIAGNOSIS — R102 Pelvic and perineal pain: Secondary | ICD-10-CM

## 2013-07-10 NOTE — Progress Notes (Signed)
Patient ID: [name], female   DOB: 1945/10/18, 67 y.o.   MRN: [id number] Presents for ultrasound. At annual exam reported some intermittent low pelvic discomfort. Mother with ovarian cancer died at age 16. Postmenopausal with no bleeding. Questions if she should have oophorectomy. Only other health issue arthritis and osteopenia managed by primary care.  Ultrasound: No abnormalities noted. Normal uterus and ovaries. No free fluid seen. Endometrium 1.0 mm.  Normal GYN ultrasound  Plan: Declines BRCA testing due to cost.  Reviewed surgical risks, no other family members with ovarian cancer. Instructed to call if changes of bloating, pressure, changes in bowel or bladder elimination.

## 2013-07-17 ENCOUNTER — Telehealth: Payer: Self-pay | Admitting: Internal Medicine

## 2013-07-17 MED ORDER — BECLOMETHASONE DIPROPIONATE 80 MCG/ACT IN AERS: INHALATION_SPRAY | RESPIRATORY_TRACT | Status: DC

## 2013-07-17 NOTE — Telephone Encounter (Signed)
done

## 2013-07-17 NOTE — Telephone Encounter (Signed)
Pt  would like refill of QVAR 80 MCG/ACT inhaler Pharm: [facility]/ [address]

## 2013-10-05 ENCOUNTER — Other Ambulatory Visit (INDEPENDENT_AMBULATORY_CARE_PROVIDER_SITE_OTHER): Payer: Medicare Other

## 2013-10-05 DIAGNOSIS — Z0000 Encounter for general adult medical examination without abnormal findings: Secondary | ICD-10-CM

## 2013-10-05 DIAGNOSIS — E669 Obesity, unspecified: Secondary | ICD-10-CM

## 2013-10-05 DIAGNOSIS — E785 Hyperlipidemia, unspecified: Secondary | ICD-10-CM

## 2013-10-05 LAB — LIPID PANEL
Cholesterol: 151 mg/dL (ref 0–200)
HDL: 60.1 mg/dL (ref >39.00)
Total CHOL/HDL Ratio: 3
Triglycerides: 52 mg/dL (ref 0.0–149.0)

## 2013-10-05 LAB — CBC WITH DIFFERENTIAL/PLATELET
Basophils Absolute: 0 10*3/uL (ref 0.0–0.1)
Basophils Relative: 0.5 % (ref 0.0–3.0)
Eosinophils Absolute: 0.1 10*3/uL (ref 0.0–0.7)
Hemoglobin: 13.6 g/dL (ref 12.0–15.0)
Lymphocytes Relative: 29.4 % (ref 12.0–46.0)
MCHC: 33.6 g/dL (ref 30.0–36.0)
Monocytes Absolute: 0.4 10*3/uL (ref 0.1–1.0)
Neutrophils Relative %: 59.4 % (ref 43.0–77.0)
Platelets: 165 10*3/uL (ref 150.0–400.0)
RBC: 4.27 Mil/uL (ref 3.87–5.11)
RDW: 13.4 % (ref 11.5–14.6)

## 2013-10-05 LAB — BASIC METABOLIC PANEL
BUN: 24 mg/dL — ABNORMAL HIGH (ref 6–23)
CO2: 29 mEq/L (ref 19–32)
Calcium: 9 mg/dL (ref 8.4–10.5)
Chloride: 106 mEq/L (ref 96–112)
Creatinine, Ser: 0.8 mg/dL (ref 0.4–1.2)
Glucose, Bld: 88 mg/dL (ref 70–99)
Potassium: 4 mEq/L (ref 3.5–5.1)

## 2013-10-05 LAB — HEPATIC FUNCTION PANEL
Albumin: 4.1 g/dL (ref 3.5–5.2)
Total Bilirubin: 0.9 mg/dL (ref 0.3–1.2)

## 2013-10-05 LAB — POCT URINALYSIS DIPSTICK
Bilirubin, UA: NEGATIVE
Glucose, UA: NEGATIVE
Nitrite, UA: NEGATIVE
Protein, UA: NEGATIVE
Spec Grav, UA: >=1.03
Urobilinogen, UA: 0.2

## 2013-10-05 LAB — TSH: TSH: 2.02 u[IU]/mL (ref 0.35–5.50)

## 2013-10-12 ENCOUNTER — Encounter: Payer: Self-pay | Admitting: Internal Medicine

## 2013-10-12 ENCOUNTER — Ambulatory Visit (INDEPENDENT_AMBULATORY_CARE_PROVIDER_SITE_OTHER): Payer: Medicare Other | Admitting: Internal Medicine

## 2013-10-12 VITALS — BP 110/70 | HR 72 | Temp 98.6°F | Resp 16 | Ht 64.25 in | Wt 217.0 lb

## 2013-10-12 DIAGNOSIS — Z0000 Encounter for general adult medical examination without abnormal findings: Secondary | ICD-10-CM

## 2013-10-12 NOTE — Progress Notes (Signed)
Pre visit review using our clinic review tool, if applicable. No additional management support is needed unless otherwise documented below in the visit note. 

## 2013-10-12 NOTE — Patient Instructions (Signed)
The patient is instructed to continue all medications as prescribed. Schedule followup with check out clerk upon leaving the clinic  

## 2013-10-12 NOTE — Progress Notes (Signed)
Subjective:    Patient ID: [name], female    DOB: [birth year], 67 y.o.   MRN: [id number]  HPIthis is a 67 year old female presents for her annual physical examination.  She is followed for morbid obesity hyperlipidemia history of asthma which has been stable and for diet and weight loss.  She states that she had lost weight prior to the holidays but gained some of it back over weight is stable and she continues on a downward trajectory.    Review of Systems  Constitutional: Negative for activity change, appetite change and fatigue.  HENT: Negative for congestion, ear pain, postnasal drip and sinus pressure.   Eyes: Negative for redness and visual disturbance.  Respiratory: Negative for cough, shortness of breath and wheezing.   Gastrointestinal: Negative for abdominal pain and abdominal distention.  Genitourinary: Negative for dysuria, frequency and menstrual problem.  Musculoskeletal: Positive for neck pain and neck stiffness. Negative for arthralgias, joint swelling and myalgias.  Skin: Negative for rash and wound.  Neurological: Negative for dizziness, weakness and headaches.  Hematological: Negative for adenopathy. Does not bruise/bleed easily.  Psychiatric/Behavioral: Negative for sleep disturbance and decreased concentration.   Past Medical History  Diagnosis Date  . SHINGLES 11/08/2008  . HYPERLIPIDEMIA, BORDERLINE 02/09/2009  . Obesity, unspecified 08/06/2008  . VARICOSE VEINS LOWER EXTREMITIES W/INFLAMMATION 02/05/2008  . ASTHMATIC BRONCHITIS, ACUTE 01/29/2008  . ASTHMA UNSPECIFIED WITH EXACERBATION 11/08/2008  . OSTEOARTHRITIS 04/24/2007  . OSTEOPOROSIS 04/24/2007  . Chest pain 03/2011    negative stress test - Dr. Myrtis Ser  . ASCUS on Pap smear 2007  . Osteopenia     History   Social History  . Marital Status: Married    Spouse Name: N/A    Number of Children: N/A  . Years of Education: N/A   Occupational History  . Not on file.   Social History Main Topics    . Smoking status: Never Smoker   . Smokeless tobacco: Not on file  . Alcohol Use: 0.5 oz/week    1 drink(s) per week  . Drug Use: Not on file  . Sexual Activity: Yes    Birth Control/ Protection: Post-menopausal   Other Topics Concern  . Not on file   Social History Narrative  . No narrative on file    Past Surgical History  Procedure Laterality Date  . Knee arthroscopy    . Spine surgery  2008  . Colposcopy      Neg HR HPV  . Bone spur shoulder      Family History  Problem Relation Age of Onset  . Ovarian cancer Mother   . Melanoma Mother   . Hypertension Father   . Heart disease Father   . Diabetes Paternal Aunt   . Heart disease Paternal Grandfather   . Cancer Maternal Grandmother     Oral cancer  . Down syndrome Sister   . Prostate cancer Maternal Uncle   . Heart disease Maternal Grandfather   . Prostate cancer Maternal Uncle   . Prostate cancer Maternal Uncle   . Breast cancer Cousin     two maternal cousins diagnosed <50    Allergies  Allergen Reactions  . Cefuroxime Axetil Diarrhea and Nausea And Vomiting    Current Outpatient Prescriptions on File Prior to Visit  Medication Sig Dispense Refill  . aspirin 81 MG EC tablet Take 81 mg by mouth daily.        . beclomethasone (QVAR) 80 MCG/ACT inhaler INHALE TWO PUFFS EVERY DAY  8.7 g  11  . CALCIUM PO Take by mouth.      . cetirizine (ZYRTEC ALLERGY) 10 MG tablet Take 10 mg by mouth at bedtime.        . Cholecalciferol (VITAMIN D3) 2000 UNITS TABS Take 2,000 Units by mouth 2 (two) times daily.        . fish oil-omega-3 fatty acids 1000 MG capsule Take 2 capsules (2 g total) by mouth 2 (two) times daily. total for 4,000 mg      . GLUCOSAMINE SULFATE PO Take by mouth.      . meloxicam (MOBIC) 7.5 MG tablet TAKE ONE TABLET BY MOUTH TWICE DAILY  180 tablet  3  . Multiple Vitamin (MULTIVITAMIN) tablet Take 1 tablet by mouth daily.      . rosuvastatin (CRESTOR) 20 MG tablet Take 20 mg by mouth 2 (two) times a  week.       No current facility-administered medications on file prior to visit.    BP 110/70  Pulse 72  Temp(Src) 98.6 F (37 C)  Resp 16  Ht 5' 4.25" (1.632 m)  Wt 217 lb (98.431 kg)  BMI 36.96 kg/m2       Objective:   Physical Exam  Constitutional: She is oriented to person, place, and time. She appears well-developed and well-nourished. No distress.  HENT:  Head: Normocephalic and atraumatic.  Right Ear: External ear normal.  Left Ear: External ear normal.  Eyes: Conjunctivae and EOM are normal. Pupils are equal, round, and reactive to light.  Neck: Normal range of motion. Neck supple. No JVD present. No tracheal deviation present. No thyromegaly present.  Cardiovascular: Normal rate and regular rhythm.   Murmur heard. Pulmonary/Chest: Effort normal and breath sounds normal. She has no wheezes. She exhibits no tenderness.  Abdominal: Soft. Bowel sounds are normal.  Musculoskeletal: Normal range of motion. She exhibits no edema and no tenderness.  Lymphadenopathy:    She has no cervical adenopathy.  Neurological: She is alert and oriented to person, place, and time. She has normal reflexes. No cranial nerve deficit.  Skin: Skin is warm and dry. She is not diaphoretic.  Psychiatric: She has a normal mood and affect. Her behavior is normal.          Assessment & Plan:   This is a routine physical examination for this healthy  Female. Reviewed all health maintenance protocols including mammography colonoscopy bone density and reviewed appropriate screening labs. Her immunization history was reviewed as well as her current medications and allergies refills of her chronic medications were given and the plan for yearly health maintenance was discussed all orders and referrals were made as appropriate.  She is at goal for lipid control.  We reinforced the need for diet exercise and weight loss following a low carbohydrate diet

## 2014-03-12 ENCOUNTER — Ambulatory Visit (INDEPENDENT_AMBULATORY_CARE_PROVIDER_SITE_OTHER): Payer: Medicare Other | Admitting: Women's Health

## 2014-03-12 ENCOUNTER — Encounter: Payer: Self-pay | Admitting: Women's Health

## 2014-03-12 DIAGNOSIS — B373 Candidiasis of vulva and vagina: Secondary | ICD-10-CM

## 2014-03-12 DIAGNOSIS — B3731 Acute candidiasis of vulva and vagina: Secondary | ICD-10-CM

## 2014-03-12 DIAGNOSIS — N898 Other specified noninflammatory disorders of vagina: Secondary | ICD-10-CM

## 2014-03-12 DIAGNOSIS — R350 Frequency of micturition: Secondary | ICD-10-CM

## 2014-03-12 LAB — WET PREP FOR TRICH, YEAST, CLUE
CLUE CELLS WET PREP: NONE SEEN
Trich, Wet Prep: NONE SEEN

## 2014-03-12 LAB — URINALYSIS W MICROSCOPIC + REFLEX CULTURE
BILIRUBIN URINE: NEGATIVE
CRYSTALS: NONE SEEN
Glucose, UA: NEGATIVE mg/dL
Ketones, ur: NEGATIVE mg/dL
Nitrite: NEGATIVE
PH: 5.5 (ref 5.0–8.0)
Protein, ur: NEGATIVE mg/dL
UROBILINOGEN UA: 0.2 mg/dL (ref 0.0–1.0)

## 2014-03-12 MED ORDER — NYSTATIN-TRIAMCINOLONE 100000-0.1 UNIT/GM-% EX OINT: 1.0000 "application " | TOPICAL_OINTMENT | Freq: Two times a day (BID) | CUTANEOUS | Status: DC

## 2014-03-12 MED ORDER — FLUCONAZOLE 150 MG PO TABS: 150.0000 mg | ORAL_TABLET | Freq: Once | ORAL | Status: DC

## 2014-03-12 NOTE — Progress Notes (Signed)
Patient ID: [name], female   DOB: 06-08-1946, 68 y.o.   MRN: [id number] Presents with complaint of vaginal itching for one week, has used over-the-counter Monistat with minimal relief. Slight burning with urination mostly when urine flow starts. No pain at end of urination. Denies abdominal pain, fever or discharge with odor. Not sexually active-husband has prostate issues.  Exam: Appears well. External genitalia extremely erythematous at introitus, perineum to rectum. Speculum exam scant discharge vaginal atrophy, wet prep positive for yeast. UA: Small blood, trace leukocytes, 0-2 WBCs, 3-6 rbc's, few bacteria  Yeast vaginitis  Plan: Mycolog ointment externally twice daily as needed. Loose clothes, keep dry. Diflucan 150 by mouth times one dose. Instructed to call if no relief of symptoms. Urine culture pending. Repeat clean-catch urine in 2 weeks if you're culture negative.

## 2014-03-12 NOTE — Patient Instructions (Signed)
Monilial Vaginitis  Vaginitis in a soreness, swelling and redness (inflammation) of the vagina and vulva. Monilial vaginitis is not a sexually transmitted infection.  CAUSES   Yeast vaginitis is caused by yeast (candida) that is normally found in your vagina. With a yeast infection, the candida has overgrown in number to a point that upsets the chemical balance.  SYMPTOMS   · White, thick vaginal discharge.  · Swelling, itching, redness and irritation of the vagina and possibly the lips of the vagina (vulva).  · Burning or painful urination.  · Painful intercourse.  DIAGNOSIS   Things that may contribute to monilial vaginitis are:  · Postmenopausal and virginal states.  · Pregnancy.  · Infections.  · Being tired, sick or stressed, especially if you had monilial vaginitis in the past.  · Diabetes. Good control will help lower the chance.  · Birth control pills.  · Tight fitting garments.  · Using bubble bath, feminine sprays, douches or deodorant tampons.  · Taking certain medications that kill germs (antibiotics).  · Sporadic recurrence can occur if you become ill.  TREATMENT   Your caregiver will give you medication.  · There are several kinds of anti monilial vaginal creams and suppositories specific for monilial vaginitis. For recurrent yeast infections, use a suppository or cream in the vagina 2 times a week, or as directed.  · Anti-monilial or steroid cream for the itching or irritation of the vulva may also be used. Get your caregiver's permission.  · Painting the vagina with methylene blue solution may help if the monilial cream does not work.  · Eating yogurt may help prevent monilial vaginitis.  HOME CARE INSTRUCTIONS   · Finish all medication as prescribed.  · Do not have sex until treatment is completed or after your caregiver tells you it is okay.  · Take warm sitz baths.  · Do not douche.  · Do not use tampons, especially scented ones.  · Wear cotton underwear.  · Avoid tight pants and panty  hose.  · Tell your sexual partner that you have a yeast infection. They should go to their caregiver if they have symptoms such as mild rash or itching.  · Your sexual partner should be treated as well if your infection is difficult to eliminate.  · Practice safer sex. Use condoms.  · Some vaginal medications cause latex condoms to fail. Vaginal medications that harm condoms are:  · Cleocin cream.  · Butoconazole (Femstat®).  · Terconazole (Terazol®) vaginal suppository.  · Miconazole (Monistat®) (may be purchased over the counter).  SEEK MEDICAL CARE IF:   · You have a temperature by mouth above 102° F (38.9° C).  · The infection is getting worse after 2 days of treatment.  · The infection is not getting better after 3 days of treatment.  · You develop blisters in or around your vagina.  · You develop vaginal bleeding, and it is not your menstrual period.  · You have pain when you urinate.  · You develop intestinal problems.  · You have pain with sexual intercourse.  Document Released: 07/11/2005 Document Revised: 12/24/2011 Document Reviewed: 03/25/2009  [employer]® Patient Information ©2014 [employer].

## 2014-03-14 LAB — URINE CULTURE
Colony Count: NO GROWTH
Organism ID, Bacteria: NO GROWTH

## 2014-03-23 ENCOUNTER — Other Ambulatory Visit: Payer: Self-pay | Admitting: Gynecology

## 2014-03-23 ENCOUNTER — Other Ambulatory Visit: Payer: Self-pay | Admitting: Women's Health

## 2014-03-23 DIAGNOSIS — R3129 Other microscopic hematuria: Secondary | ICD-10-CM

## 2014-04-14 ENCOUNTER — Other Ambulatory Visit: Payer: Medicare Other

## 2014-04-15 ENCOUNTER — Other Ambulatory Visit: Payer: Medicare Other

## 2014-04-15 DIAGNOSIS — R3129 Other microscopic hematuria: Secondary | ICD-10-CM

## 2014-04-16 LAB — URINALYSIS W MICROSCOPIC + REFLEX CULTURE
Bacteria, UA: NONE SEEN
Bilirubin Urine: NEGATIVE
CRYSTALS: NONE SEEN
Casts: NONE SEEN
Glucose, UA: NEGATIVE mg/dL
Hgb urine dipstick: NEGATIVE
Ketones, ur: NEGATIVE mg/dL
Leukocytes, UA: NEGATIVE
Nitrite: NEGATIVE
Protein, ur: NEGATIVE mg/dL
SPECIFIC GRAVITY, URINE: 1.021 (ref 1.005–1.030)
SQUAMOUS EPITHELIAL / LPF: NONE SEEN
UROBILINOGEN UA: 0.2 mg/dL (ref 0.0–1.0)
pH: 6.5 (ref 5.0–8.0)

## 2014-04-21 ENCOUNTER — Encounter: Payer: Self-pay | Admitting: Women's Health

## 2014-06-14 ENCOUNTER — Telehealth: Payer: Self-pay | Admitting: Internal Medicine

## 2014-06-14 MED ORDER — MELOXICAM 7.5 MG PO TABS: ORAL_TABLET | ORAL | Status: DC

## 2014-06-14 NOTE — Telephone Encounter (Signed)
Pt needs refill on mobic sent to [facility]. Pt has appt with dr hunter on 06-17-14

## 2014-06-14 NOTE — Telephone Encounter (Signed)
Rx sent to pharmacy   

## 2014-06-17 ENCOUNTER — Encounter: Payer: Self-pay | Admitting: Family Medicine

## 2014-06-17 ENCOUNTER — Ambulatory Visit (INDEPENDENT_AMBULATORY_CARE_PROVIDER_SITE_OTHER): Payer: Medicare Other | Admitting: Family Medicine

## 2014-06-17 VITALS — BP 120/70 | HR 68 | Temp 98.1°F | Wt 216.0 lb

## 2014-06-17 DIAGNOSIS — Z6841 Body Mass Index (BMI) 40.0 and over, adult: Secondary | ICD-10-CM | POA: Insufficient documentation

## 2014-06-17 DIAGNOSIS — E785 Hyperlipidemia, unspecified: Secondary | ICD-10-CM

## 2014-06-17 DIAGNOSIS — M1990 Unspecified osteoarthritis, unspecified site: Secondary | ICD-10-CM

## 2014-06-17 DIAGNOSIS — J449 Chronic obstructive pulmonary disease, unspecified: Secondary | ICD-10-CM

## 2014-06-17 DIAGNOSIS — E669 Obesity, unspecified: Secondary | ICD-10-CM | POA: Insufficient documentation

## 2014-06-17 DIAGNOSIS — N200 Calculus of kidney: Secondary | ICD-10-CM | POA: Insufficient documentation

## 2014-06-17 NOTE — Progress Notes (Signed)
Garret Reddish, MD Phone: [phone]  Subjective:  Patient presents today to establish care with me as their new primary care provider. Patient was formerly a patient of Dr. Arnoldo Morale. Chief complaint-noted.   Hyperlipidemia On statin: Crestor 20mg  twice weekly. Formerly got samples Regular exercise: no, wants to start water aerobics ROS- no chest pain or shortness of breath. No myalgias  COPD States asthma from 2nd hand smoke. States was told she has lungs of long term smoker off PFTs-assume this was likely COPD so updated. Stable on QVAR ROS-no current shortness of breath, doe, or chest pain  OA-knees Just had mobic refilled takes 1 to 2 7.5mg  pills per day and does well with this. Right knee worst.  ROS-no red swollen joints   The following were reviewed and entered/updated in epic: Past Medical History  Diagnosis Date  . SHINGLES 11/08/2008    had vaccine at drugstore 12/14  . [place] 02/09/2009  . Obesity, unspecified 08/06/2008  . VARICOSE VEINS LOWER EXTREMITIES W/INFLAMMATION 02/05/2008  . ASTHMATIC BRONCHITIS, ACUTE 01/29/2008  . ASTHMA UNSPECIFIED WITH EXACERBATION 11/08/2008  . OSTEOARTHRITIS 04/24/2007  . Chest pain 03/2011    negative stress test - Dr. Ron Parker  . ASCUS on Pap smear 2007  . Osteopenia    Patient Active Problem List   Diagnosis Date Noted  . Hyperlipidemia with target LDL less than 100 02/09/2009    Priority: Medium  . COPD (chronic obstructive pulmonary disease) 02/05/2008    Priority: Medium  . Obesity, unspecified 06/17/2014    Priority: Low  . ASCUS on Pap smear     Priority: Low  . Osteopenia     Priority: Low  . SINUSITIS, CHRONIC 08/06/2008    Priority: Low  . ALLERGIC RHINITIS 05/07/2008    Priority: Low  . VARICOSE VEINS LOWER EXTREMITIES W/INFLAMMATION 02/05/2008    Priority: Low  . OSTEOARTHRITIS 04/24/2007    Priority: Low  . Kidney stones 06/17/2014   Past Surgical History  Procedure Laterality Date  .  Knee arthroscopy    . Spine surgery  2008    lumbar  . Colposcopy      Neg HR HPV  . Bone spur shoulder      x2 removed    Family History  Problem Relation Age of Onset  . Ovarian cancer Mother   . Melanoma Mother   . Hypertension Father   . Heart disease Father     MI 65  . Diabetes Paternal Aunt   . Heart disease Paternal Grandfather   . Cancer Maternal Grandmother     Oral cancer  . Down syndrome Sister   . Prostate cancer Maternal Uncle   . Heart disease Maternal Grandfather   . Prostate cancer Maternal Uncle   . Prostate cancer Maternal Uncle   . Breast cancer Cousin     two maternal cousins diagnosed <50    Medications- reviewed and updated Current Outpatient Prescriptions  Medication Sig Dispense Refill  . aspirin 81 MG EC tablet Take 81 mg by mouth daily.        . beclomethasone (QVAR) 80 MCG/ACT inhaler INHALE TWO PUFFS EVERY DAY  8.7 g  11  . CALCIUM PO Take by mouth.      . cetirizine (ZYRTEC ALLERGY) 10 MG tablet Take 10 mg by mouth at bedtime.        . Cholecalciferol (VITAMIN D3) 2000 UNITS TABS Take 2,000 Units by mouth 2 (two) times daily.        . fish  oil-omega-3 fatty acids 1000 MG capsule Take 2 capsules (2 g total) by mouth 2 (two) times daily. total for 4,000 mg      . GLUCOSAMINE SULFATE PO Take by mouth.      . meloxicam (MOBIC) 7.5 MG tablet TAKE ONE TABLET BY MOUTH TWICE DAILY  60 tablet  0  . Multiple Vitamin (MULTIVITAMIN) tablet Take 1 tablet by mouth daily.      . rosuvastatin (CRESTOR) 20 MG tablet Take 20 mg by mouth 2 (two) times a week.      . nystatin-triamcinolone ointment (MYCOLOG) Apply 1 application topically 2 (two) times daily.  60 g  1   No current facility-administered medications for this visit.    Allergies-reviewed and updated Allergies  Allergen Reactions  . Cefuroxime Axetil Diarrhea and Nausea And Vomiting    History   Social History  . Marital Status: Married    Spouse Name: N/A    Number of Children: N/A  .  Years of Education: N/A   Social History Main Topics  . Smoking status: Passive Smoke Exposure - Never Smoker  . Smokeless tobacco: None     Comment: last exposure 1999  . Alcohol Use: 0.5 oz/week    1 drink(s) per week  . Drug Use: No  . Sexual Activity: Yes    Birth Control/ Protection: Post-menopausal   Other Topics Concern  . None   Social History Narrative   Married 44 years in 2015. 2 kids. 2 grandkids.       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 In [place], got job in [employer]. Retired 3 years ago.       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 Oropharynx normal. Good dentition-several crowns.  CV: RRR no murmurs rubs or gallops Lungs: CTAB no crackles, wheeze, rhonchi Abdomen: soft/nontender/nondistended/normal bowel sounds.  Ext: trace edema Skin: warm, dry, no rash  Assessment/Plan:  COPD (chronic obstructive pulmonary disease) Well controlled on qvar alone. No rescue inhaler at this time.   Hyperlipidemia with target LDL less than 100 Well controlled on twice weekly crestor. Recheck at physical in January.   OSTEOARTHRITIS Pain currently controlled with meloxicam prn. Holding off on knee replacement. Does not want steroids to avoid weight gain.    Health Maintenance Due  Topic Date Due  . Colonoscopy -wants to wait until winter 12/09/1996  . Influenza Vaccine -wait a few months 05/15/2014  Physical in January planned

## 2014-06-17 NOTE — Patient Instructions (Addendum)
Wonderful to meet you! Glad to have the team on board!   Cholesterol looked great last year. Let's recheck at physical. Continue current dose.   COPD instead of asthma likely-continue qvar.   Arthritis-continue meloxicam. Check kidney test with next set of labs.

## 2014-06-17 NOTE — Assessment & Plan Note (Signed)
Well controlled on twice weekly crestor. Recheck at physical in January.

## 2014-06-17 NOTE — Assessment & Plan Note (Signed)
Well controlled on qvar alone. No rescue inhaler at this time.

## 2014-06-17 NOTE — Assessment & Plan Note (Signed)
Pain currently controlled with meloxicam prn. Holding off on knee replacement. Does not want steroids to avoid weight gain.

## 2014-07-05 ENCOUNTER — Ambulatory Visit (INDEPENDENT_AMBULATORY_CARE_PROVIDER_SITE_OTHER): Payer: Medicare Other | Admitting: Family Medicine

## 2014-07-05 ENCOUNTER — Encounter: Payer: Self-pay | Admitting: Family Medicine

## 2014-07-05 VITALS — BP 100/70 | HR 60 | Temp 97.8°F | Wt 212.0 lb

## 2014-07-05 DIAGNOSIS — Z111 Encounter for screening for respiratory tuberculosis: Secondary | ICD-10-CM

## 2014-07-05 MED ORDER — MELOXICAM 7.5 MG PO TABS: ORAL_TABLET | ORAL | Status: DC

## 2014-07-05 NOTE — Progress Notes (Signed)
No charge visit. All exams completed at last visit. Only needed Tb skin test.

## 2014-07-07 ENCOUNTER — Ambulatory Visit: Payer: Medicare Other | Admitting: Family Medicine

## 2014-07-07 LAB — TB SKIN TEST
Induration: 0 mm
TB Skin Test: NEGATIVE

## 2014-07-20 ENCOUNTER — Encounter: Payer: Medicare Other | Admitting: Women's Health

## 2014-07-20 ENCOUNTER — Telehealth: Payer: Self-pay | Admitting: Family Medicine

## 2014-07-20 MED ORDER — BECLOMETHASONE DIPROPIONATE 80 MCG/ACT IN AERS: INHALATION_SPRAY | RESPIRATORY_TRACT | Status: DC

## 2014-07-20 NOTE — Telephone Encounter (Signed)
Pt needs a re-fill on beclomethasone (QVAR) 80 MCG/ACT inhaler sent to [id number] [address] [place] Pt states she is leaving town tomorrow for 3 weeks and need a re-fill .

## 2014-07-20 NOTE — Telephone Encounter (Signed)
Medication refilled

## 2014-08-16 ENCOUNTER — Encounter: Payer: Self-pay | Admitting: Family Medicine

## 2014-08-25 ENCOUNTER — Ambulatory Visit (INDEPENDENT_AMBULATORY_CARE_PROVIDER_SITE_OTHER): Payer: Medicare Other | Admitting: Women's Health

## 2014-08-25 ENCOUNTER — Encounter: Payer: Self-pay | Admitting: Women's Health

## 2014-08-25 VITALS — BP 130/89 | Ht 65.0 in | Wt 218.0 lb

## 2014-08-25 DIAGNOSIS — N952 Postmenopausal atrophic vaginitis: Secondary | ICD-10-CM

## 2014-08-25 NOTE — Progress Notes (Signed)
[name] 30-Jan-1946 [id number]    History:    Presents for breast and pelvic exam. Postmenopausal/no HRT/no bleeding. Normal  mammogram history. 2007 ASCUS with negative HR HPV with a negative colposcopy and biopsy and normal Paps after. Current on vaccines. DEXA at orthopedic office several years ago reports as normal will follow-up. Has not had a colonoscopy.  Past medical history, past surgical history, family history and social history were all reviewed and documented in the EPIC chart. Retired Careers information officer. Has a large garden. Husband had an MI doing better. 2 children both doing well.  ROS:  A  12 point ROS was performed and pertinent positives and negatives are included.  Exam:  Filed Vitals:   08/25/14 0937  BP: 130/89    General appearance:  Normal Thyroid:  Symmetrical, normal in size, without palpable masses or nodularity. Respiratory  Auscultation:  Clear without wheezing or rhonchi Cardiovascular  Auscultation:  Regular rate, without rubs, murmurs or gallops  Edema/varicosities:  Not grossly evident Abdominal  Soft,nontender, without masses, guarding or rebound.  Liver/spleen:  No organomegaly noted  Hernia:  None appreciated  Skin  Inspection:  Grossly normal   Breasts: Examined lying and sitting.     Right: Without masses, retractions, discharge or axillary adenopathy.     Left: Without masses, retractions, discharge or axillary adenopathy. Gentitourinary   Inguinal/mons:  Normal without inguinal adenopathy  External genitalia:  Normal  BUS/Urethra/Skene's glands:  Normal  Vagina:  Asymptomatic atrophy  Cervix:  Normal  Uterus:   normal in size, shape and contour.  Midline and mobile  Adnexa/parametria:     Rt: Without masses or tenderness.   Lt: Without masses or tenderness.  Anus and perineum: Normal  Digital rectal exam: Normal sphincter tone without palpated masses or tenderness  Assessment/Plan:  68 y.o. MWF G2P2 for breast and pelvic  exam.  Hypercholesterolemia/COPD-primary care manages labs and meds Obesity Postmenopausal/no HRT/no bleeding/vaginal atrophy  Plan:  Has rare intercourse, continue lubricants as needed. SBE's, continue annual mammogram, calcium rich diet, vitamin D 2000. Home safety, fall prevention and importance of regular exercise reviewed. Reviewed importance of screening colonoscopy states will schedule through primary care. Repeat DEXA at orthopedic office for comparison. Pap normal 2014, new screening guidelines reviewed.    [place], 10:12 AM 08/25/2014

## 2014-08-25 NOTE — Patient Instructions (Signed)
Health Recommendations for Postmenopausal Women Respected and ongoing research has looked at the most common causes of death, disability, and poor quality of life in postmenopausal women. The causes include heart disease, diseases of blood vessels, diabetes, depression, cancer, and bone loss (osteoporosis). Many things can be done to help lower the chances of developing these and other common problems. CARDIOVASCULAR DISEASE Heart Disease: A heart attack is a medical emergency. Know the signs and symptoms of a heart attack. Below are things women can do to reduce their risk for heart disease.   Do not smoke. If you smoke, quit.  Aim for a healthy weight. Being overweight causes many preventable deaths. Eat a healthy and balanced diet and drink an adequate amount of liquids.  Get moving. Make a commitment to be more physically active. Aim for 30 minutes of activity on most, if not all days of the week.  Eat for heart health. Choose a diet that is low in saturated fat and cholesterol and eliminate trans fat. Include whole grains, vegetables, and fruits. Read and understand the labels on food containers before buying.  Know your numbers. Ask your caregiver to check your blood pressure, cholesterol (total, HDL, LDL, triglycerides) and blood glucose. Work with your caregiver on improving your entire clinical picture.  High blood pressure. Limit or stop your table salt intake (try salt substitute and food seasonings). Avoid salty foods and drinks. Read labels on food containers before buying. Eating well and exercising can help control high blood pressure. STROKE  Stroke is a medical emergency. Stroke may be the result of a blood clot in a blood vessel in the brain or by a brain hemorrhage (bleeding). Know the signs and symptoms of a stroke. To lower the risk of developing a stroke:  Avoid fatty foods.  Quit smoking.  Control your diabetes, blood pressure, and irregular heart rate. THROMBOPHLEBITIS  (BLOOD CLOT) OF THE LEG  Becoming overweight and leading a stationary lifestyle may also contribute to developing blood clots. Controlling your diet and exercising will help lower the risk of developing blood clots. CANCER SCREENING  Breast Cancer: Take steps to reduce your risk of breast cancer.  You should practice "breast self-awareness." This means understanding the normal appearance and feel of your breasts and should include breast self-examination. Any changes detected, no matter how small, should be reported to your caregiver.  After age 40, you should have a clinical breast exam (CBE) every year.  Starting at age 40, you should consider having a mammogram (breast X-ray) every year.  If you have a family history of breast cancer, talk to your caregiver about genetic screening.  If you are at high risk for breast cancer, talk to your caregiver about having an MRI and a mammogram every year.  Intestinal or Stomach Cancer: Tests to consider are a rectal exam, fecal occult blood, sigmoidoscopy, and colonoscopy. Women who are high risk may need to be screened at an earlier age and more often.  Cervical Cancer:  Beginning at age 30, you should have a Pap test every 3 years as long as the past 3 Pap tests have been normal.  If you have had past treatment for cervical cancer or a condition that could lead to cancer, you need Pap tests and screening for cancer for at least 20 years after your treatment.  If you had a hysterectomy for a problem that was not cancer or a condition that could lead to cancer, then you no longer need Pap tests.    If you are between ages 65 and 70, and you have had normal Pap tests going back 10 years, you no longer need Pap tests.  If Pap tests have been discontinued, risk factors (such as a new sexual partner) need to be reassessed to determine if screening should be resumed.  Some medical problems can increase the chance of getting cervical cancer. In these  cases, your caregiver may recommend more frequent screening and Pap tests.  Uterine Cancer: If you have vaginal bleeding after reaching menopause, you should notify your caregiver.  Ovarian Cancer: Other than yearly pelvic exams, there are no reliable tests available to screen for ovarian cancer at this time except for yearly pelvic exams.  Lung Cancer: Yearly chest X-rays can detect lung cancer and should be done on high risk women, such as cigarette smokers and women with chronic lung disease (emphysema).  Skin Cancer: A complete body skin exam should be done at your yearly examination. Avoid overexposure to the sun and ultraviolet light lamps. Use a strong sun block cream when in the sun. All of these things are important for lowering the risk of skin cancer. MENOPAUSE Menopause Symptoms: Hormone therapy products are effective for treating symptoms associated with menopause:  Moderate to severe hot flashes.  Night sweats.  Mood swings.  Headaches.  Tiredness.  Loss of sex drive.  Insomnia.  Other symptoms. Hormone replacement carries certain risks, especially in older women. Women who use or are thinking about using estrogen or estrogen with progestin treatments should discuss that with their caregiver. Your caregiver will help you understand the benefits and risks. The ideal dose of hormone replacement therapy is not known. The Food and Drug Administration (FDA) has concluded that hormone therapy should be used only at the lowest doses and for the shortest amount of time to reach treatment goals.  OSTEOPOROSIS Protecting Against Bone Loss and Preventing Fracture If you use hormone therapy for prevention of bone loss (osteoporosis), the risks for bone loss must outweigh the risk of the therapy. Ask your caregiver about other medications known to be safe and effective for preventing bone loss and fractures. To guard against bone loss or fractures, the following is recommended:  If  you are younger than age 50, take 1000 mg of calcium and at least 600 mg of Vitamin D per day.  If you are older than age 50 but younger than age 70, take 1200 mg of calcium and at least 600 mg of Vitamin D per day.  If you are older than age 70, take 1200 mg of calcium and at least 800 mg of Vitamin D per day. Smoking and excessive alcohol intake increases the risk of osteoporosis. Eat foods rich in calcium and vitamin D and do weight bearing exercises several times a week as your caregiver suggests. DIABETES Diabetes Mellitus: If you have type I or type 2 diabetes, you should keep your blood sugar under control with diet, exercise, and recommended medication. Avoid starchy and fatty foods, and too many sweets. Being overweight can make diabetes control more difficult. COGNITION AND MEMORY Cognition and Memory: Menopausal hormone therapy is not recommended for the prevention of cognitive disorders such as Alzheimer's disease or memory loss.  DEPRESSION  Depression may occur at any age, but it is common in elderly women. This may be because of physical, medical, social (loneliness), or financial problems and needs. If you are experiencing depression because of medical problems and control of symptoms, talk to your caregiver about this. Physical   activity and exercise may help with mood and sleep. Community and volunteer involvement may improve your sense of value and worth. If you have depression and you feel that the problem is getting worse or becoming severe, talk to your caregiver about which treatment options are best for you. ACCIDENTS  Accidents are common and can be serious in elderly woman. Prepare your house to prevent accidents. Eliminate throw rugs, place hand bars in bath, shower, and toilet areas. Avoid wearing high heeled shoes or walking on wet, snowy, and icy areas. Limit or stop driving if you have vision or hearing problems, or if you feel you are unsteady with your movements and  reflexes. HEPATITIS C Hepatitis C is a type of viral infection affecting the liver. It is spread mainly through contact with blood from an infected person. It can be treated, but if left untreated, it can lead to severe liver damage over the years. Many people who are infected do not know that the virus is in their blood. If you are a "baby-boomer", it is recommended that you have one screening test for Hepatitis C. IMMUNIZATIONS  Several immunizations are important to consider having during your senior years, including:   Tetanus, diphtheria, and pertussis booster shot.  Influenza every year before the flu season begins.  Pneumonia vaccine.  Shingles vaccine.  Others, as indicated based on your specific needs. Talk to your caregiver about these. Document Released: 11/23/2005 Document Revised: 02/15/2014 Document Reviewed: 07/19/2008 Exit[employer] Patient Information 2015 [employer]. This information is not intended to replace advice given to you by your health care provider. Make sure you discuss any questions you have with your health care provider.  

## 2014-09-03 ENCOUNTER — Encounter: Payer: Self-pay | Admitting: Cardiovascular Disease

## 2014-09-06 ENCOUNTER — Encounter: Payer: Self-pay | Admitting: Cardiology

## 2014-10-18 ENCOUNTER — Other Ambulatory Visit (INDEPENDENT_AMBULATORY_CARE_PROVIDER_SITE_OTHER): Payer: Medicare Other

## 2014-10-18 DIAGNOSIS — E785 Hyperlipidemia, unspecified: Secondary | ICD-10-CM

## 2014-10-18 DIAGNOSIS — Z0000 Encounter for general adult medical examination without abnormal findings: Secondary | ICD-10-CM

## 2014-10-18 LAB — CBC WITH DIFFERENTIAL/PLATELET
BASOS ABS: 0 10*3/uL (ref 0.0–0.1)
Basophils Relative: 0.7 % (ref 0.0–3.0)
Eosinophils Absolute: 0.1 10*3/uL (ref 0.0–0.7)
Eosinophils Relative: 1.9 % (ref 0.0–5.0)
HCT: 40 % (ref 36.0–46.0)
HEMOGLOBIN: 12.9 g/dL (ref 12.0–15.0)
Lymphocytes Relative: 20.2 % (ref 12.0–46.0)
Lymphs Abs: 1.1 10*3/uL (ref 0.7–4.0)
MCHC: 32.4 g/dL (ref 30.0–36.0)
MCV: 95.7 fl (ref 78.0–100.0)
Monocytes Absolute: 0.4 10*3/uL (ref 0.1–1.0)
Monocytes Relative: 8.2 % (ref 3.0–12.0)
NEUTROS ABS: 3.6 10*3/uL (ref 1.4–7.7)
Neutrophils Relative %: 69 % (ref 43.0–77.0)
Platelets: 165 10*3/uL (ref 150.0–400.0)
RBC: 4.18 Mil/uL (ref 3.87–5.11)
RDW: 13.4 % (ref 11.5–15.5)
WBC: 5.3 10*3/uL (ref 4.0–10.5)

## 2014-10-18 LAB — POCT URINALYSIS DIPSTICK
BILIRUBIN UA: NEGATIVE
Glucose, UA: NEGATIVE
Ketones, UA: NEGATIVE
Nitrite, UA: NEGATIVE
PH UA: 6
Protein, UA: NEGATIVE
SPEC GRAV UA: 1.025
Urobilinogen, UA: 0.2

## 2014-10-18 LAB — COMPREHENSIVE METABOLIC PANEL
ALT: 42 U/L — ABNORMAL HIGH (ref 0–35)
AST: 31 U/L (ref 0–37)
Albumin: 3.9 g/dL (ref 3.5–5.2)
Alkaline Phosphatase: 81 U/L (ref 39–117)
BUN: 25 mg/dL — ABNORMAL HIGH (ref 6–23)
CO2: 27 mEq/L (ref 19–32)
Calcium: 8.9 mg/dL (ref 8.4–10.5)
Chloride: 108 mEq/L (ref 96–112)
Creatinine, Ser: 0.8 mg/dL (ref 0.4–1.2)
GFR: 78.1 mL/min (ref >60.00)
Glucose, Bld: 89 mg/dL (ref 70–99)
Potassium: 4.2 mEq/L (ref 3.5–5.1)
Sodium: 142 mEq/L (ref 135–145)
Total Bilirubin: 0.8 mg/dL (ref 0.2–1.2)
Total Protein: 6.6 g/dL (ref 6.0–8.3)

## 2014-10-18 LAB — LIPID PANEL
Cholesterol: 189 mg/dL (ref 0–200)
HDL: 60.3 mg/dL (ref >39.00)
LDL Cholesterol: 114 mg/dL — ABNORMAL HIGH (ref 0–99)
NonHDL: 128.7
Total CHOL/HDL Ratio: 3
Triglycerides: 74 mg/dL (ref 0.0–149.0)
VLDL: 14.8 mg/dL (ref 0.0–40.0)

## 2014-10-18 LAB — TSH: TSH: 2.08 u[IU]/mL (ref 0.35–4.50)

## 2014-10-25 ENCOUNTER — Encounter: Payer: Self-pay | Admitting: Family Medicine

## 2014-10-25 ENCOUNTER — Ambulatory Visit (INDEPENDENT_AMBULATORY_CARE_PROVIDER_SITE_OTHER): Payer: Medicare Other | Admitting: *Deleted

## 2014-10-25 ENCOUNTER — Ambulatory Visit (INDEPENDENT_AMBULATORY_CARE_PROVIDER_SITE_OTHER): Payer: Medicare Other | Admitting: Family Medicine

## 2014-10-25 VITALS — BP 132/72 | HR 79 | Temp 97.4°F | Ht 63.5 in | Wt 218.0 lb

## 2014-10-25 DIAGNOSIS — IMO0001 Reserved for inherently not codable concepts without codable children: Secondary | ICD-10-CM

## 2014-10-25 DIAGNOSIS — E785 Hyperlipidemia, unspecified: Secondary | ICD-10-CM

## 2014-10-25 DIAGNOSIS — N200 Calculus of kidney: Secondary | ICD-10-CM

## 2014-10-25 DIAGNOSIS — J432 Centrilobular emphysema: Secondary | ICD-10-CM

## 2014-10-25 DIAGNOSIS — Z1211 Encounter for screening for malignant neoplasm of colon: Secondary | ICD-10-CM

## 2014-10-25 DIAGNOSIS — Z0000 Encounter for general adult medical examination without abnormal findings: Secondary | ICD-10-CM

## 2014-10-25 DIAGNOSIS — Z23 Encounter for immunization: Secondary | ICD-10-CM

## 2014-10-25 MED ORDER — ROSUVASTATIN CALCIUM 20 MG PO TABS: 20.0000 mg | ORAL_TABLET | ORAL | Status: DC

## 2014-10-25 NOTE — Assessment & Plan Note (Signed)
Likely cause of hematuria. Monitored by urology. No further hematuria workup planned at this time.

## 2014-10-25 NOTE — Patient Instructions (Addendum)
Sign release of information for [facility] to send [place] your bone density test. Continue calcium/vit D.   Prevnar next visit  See me in 4 months. Get back to eating better. Hopeful a few lbs down. Recheck cholesterol and if still up may need to increase medicine.   They will call you about colonoscopy.   Blood pressure looks ok-was better at lower weight.

## 2014-10-25 NOTE — Assessment & Plan Note (Signed)
Discussed need for weight loss. Advised 3 days a week due to LDL >100 but patient declines at this time.

## 2014-10-25 NOTE — Assessment & Plan Note (Signed)
Very well controlled, does not even have albuterol on hand due to lack of need.

## 2014-10-25 NOTE — Progress Notes (Signed)
Pre visit review using our clinic review tool, if applicable. No additional management support is needed unless otherwise documented below in the visit note. 

## 2014-10-25 NOTE — Progress Notes (Signed)
Garret Reddish, MD Phone: [phone]  Subjective:  Patient presents today for annual physical. Her COPD is controlled with qvar and occurred due to second hand smoke. Her hyperlipidemia is slightly above goal wth LDL above 100 with recent weight gain. Patient admits to many poor eating habits over the holidays and decreased exercise. She has hematuria noted on her labs but this is likely due to known nephrolithiasis. Her varicose veins tend to bother her more with weight gain that she recently has.   ROS- rare SOB, no chest pain, no gross hematuria. No myalgias. Otherwise full ROS completed and negative.   The following were reviewed and entered/updated in epic: Past Medical History  Diagnosis Date  . SHINGLES 11/08/2008    had vaccine at drugstore 12/14  . [place], [place] 02/09/2009  . Obesity, unspecified 08/06/2008  . VARICOSE VEINS LOWER EXTREMITIES W/INFLAMMATION 02/05/2008  . ASTHMATIC BRONCHITIS, ACUTE 01/29/2008  . ASTHMA UNSPECIFIED WITH EXACERBATION 11/08/2008  . OSTEOARTHRITIS 04/24/2007  . Chest pain 03/2011    negative stress test - Dr. Ron Parker  . ASCUS on Pap smear 2007  . Osteopenia    Patient Active Problem List   Diagnosis Date Noted  . Hyperlipidemia with target LDL less than 100 02/09/2009    Priority: Medium  . COPD (chronic obstructive pulmonary disease) 02/05/2008    Priority: Medium  . Obesity, unspecified 06/17/2014    Priority: Low  . Kidney stones 06/17/2014    Priority: Low  . ASCUS on Pap smear     Priority: Low  . Osteopenia     Priority: Low  . ALLERGIC RHINITIS 05/07/2008    Priority: Low  . VARICOSE VEINS LOWER EXTREMITIES W/INFLAMMATION 02/05/2008    Priority: Low  . OSTEOARTHRITIS 04/24/2007    Priority: Low   Past Surgical History  Procedure Laterality Date  . Knee arthroscopy    . Spine surgery  2008    lumbar  . Colposcopy      Neg HR HPV  . Bone spur shoulder      x2 removed    Family History  Problem Relation Age of  Onset  . Ovarian cancer Mother   . Melanoma Mother   . Hypertension Father   . Heart disease Father     MI 69  . Diabetes Paternal Aunt   . Heart disease Paternal Grandfather   . Cancer Maternal Grandmother     Oral cancer  . Down syndrome Sister   . Prostate cancer Maternal Uncle   . Heart disease Maternal Grandfather   . Prostate cancer Maternal Uncle   . Prostate cancer Maternal Uncle   . Breast cancer Cousin     two maternal cousins diagnosed <50    Medications- reviewed and updated Current Outpatient Prescriptions  Medication Sig Dispense Refill  . aspirin 81 MG EC tablet Take 81 mg by mouth daily.      . beclomethasone (QVAR) 80 MCG/ACT inhaler INHALE TWO PUFFS EVERY DAY 8.7 g 1  . CALCIUM PO Take 1 tablet by mouth daily.     . cetirizine (ZYRTEC ALLERGY) 10 MG tablet Take 10 mg by mouth at bedtime.      . Cholecalciferol (VITAMIN D3) 2000 UNITS TABS Take 2,000 Units by mouth 2 (two) times daily.      . fish oil-omega-3 fatty acids 1000 MG capsule Take 2 capsules (2 g total) by mouth 2 (two) times daily. total for 4,000 mg (Patient taking differently: Take 2 g by mouth daily. total for 4,000 mg)    .  GLUCOSAMINE SULFATE PO Take 1 tablet by mouth daily.     . meloxicam (MOBIC) 7.5 MG tablet TAKE ONE TABLET BY MOUTH TWICE DAILY 180 tablet 1  . Multiple Vitamin (MULTIVITAMIN) tablet Take 1 tablet by mouth daily.    . rosuvastatin (CRESTOR) 20 MG tablet Take 20 mg by mouth 2 (two) times a week.     Allergies-reviewed and updated Allergies  Allergen Reactions  . Cefuroxime Axetil Diarrhea and Nausea And Vomiting    History   Social History  . Marital Status: Married    Spouse Name: N/A    Number of Children: N/A  . Years of Education: N/A   Social History Main Topics  . Smoking status: Passive Smoke Exposure - Never Smoker  . Smokeless tobacco: None     Comment: last exposure 1999  . Alcohol Use: 0.5 oz/week    1 drink(s) per week  . Drug Use: No  . Sexual  Activity: Yes    Birth Control/ Protection: Post-menopausal   Other Topics Concern  . None   Social History Narrative   Married 44 years in 2015. 2 kids. 2 grandkids.       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 Oropharynx normal.  Neck: no thyromegaly CV: RRR no murmurs rubs or gallops Lungs: CTAB no crackles, wheeze, rhonchi Abdomen: soft/nontender/nondistended/normal bowel sounds. No rebound or guarding.  Ext: trace edema Skin: warm, dry, no rash Neuro: grossly normal, moves all extremities, PERRLA   Assessment/Plan:  69 y.o. female presenting for annual physical.  Health Maintenance counseling: 1. Anticipatory guidance: Patient counseled regarding regular dental exams, wearing seatbelts, yearly eye exams, wearing sunscreen.  2. 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  3. Immunizations/screenings/ancillary studies- trying to get records for bone density, send for colonscopy 4. OB/gyn office does pelvic exams (mom with ovarian cancer so continues care), and breast exams. Mammograms through [facility].   Kidney stones Likely cause of hematuria. Monitored by urology. No further hematuria workup planned at this time.   COPD (chronic obstructive pulmonary disease) Very well controlled, does not even have albuterol on hand due to lack of need.    Hyperlipidemia with target LDL less than 100 Discussed need for weight loss. Advised 3 days a week due to LDL >100 but patient declines at this time.   Return precautions advised. 4 month follow up for lipids/weight.   Orders Placed This Encounter  Procedures  . Ambulatory referral to Gastroenterology    Referral Priority:  Routine    Referral Type:  Consultation    Referral Reason:  Specialty Services Required    Requested Specialty:  Gastroenterology    Number of Visits Requested:  1   Meds ordered this encounter  Medications  . rosuvastatin (CRESTOR) 20 MG tablet    Sig: Take 1  tablet (20 mg total) by mouth 2 (two) times a week.    Dispense:  10 tablet    Refill:  3

## 2014-12-02 ENCOUNTER — Other Ambulatory Visit: Payer: Self-pay | Admitting: Family Medicine

## 2014-12-27 ENCOUNTER — Encounter: Payer: Self-pay | Admitting: Cardiovascular Disease

## 2014-12-27 ENCOUNTER — Encounter (INDEPENDENT_AMBULATORY_CARE_PROVIDER_SITE_OTHER): Payer: Self-pay

## 2014-12-27 ENCOUNTER — Ambulatory Visit (INDEPENDENT_AMBULATORY_CARE_PROVIDER_SITE_OTHER): Payer: Medicare Other | Admitting: Cardiovascular Disease

## 2014-12-27 VITALS — BP 110/78 | HR 62 | Ht 64.5 in | Wt 216.8 lb

## 2014-12-27 DIAGNOSIS — R0789 Other chest pain: Secondary | ICD-10-CM | POA: Insufficient documentation

## 2014-12-27 DIAGNOSIS — R002 Palpitations: Secondary | ICD-10-CM

## 2014-12-27 NOTE — Patient Instructions (Signed)
Your physician wants you to follow-up in:  6 months. You will receive a reminder letter in the mail two months in advance. If you don't receive a letter, please call our office to schedule the follow-up appointment.   

## 2014-12-27 NOTE — Assessment & Plan Note (Signed)
Could be due to SVT. However, her symptoms are very rare and they don't last long. Thus, the utility of Holter monitor is very low.

## 2014-12-27 NOTE — Progress Notes (Signed)
HPI  This is a 69 year old female who is self-referred for evaluation of atypical chest pain. She has no previous cardiac history. She has known history of COPD from second hand smoking, and hyperlipidemia. She does have family history of coronary artery disease but not prematurely. She is a lifelong nonsmoker. She has no history of hypertension or diabetes. She reports prolonged episodes of substernal aching which can be on the right or left side. This is not exertional. No significant shortness of breath. She has rare episodes of palpitations described as sudden fast heartbeats terminating without intervention.  Allergies  Allergen Reactions  . Cefuroxime Axetil Diarrhea and Nausea And Vomiting     Current Outpatient Prescriptions on File Prior to Visit  Medication Sig Dispense Refill  . aspirin 81 MG EC tablet Take 81 mg by mouth daily.      . beclomethasone (QVAR) 80 MCG/ACT inhaler INHALE TWO PUFFS EVERY DAY 8.7 g 1  . CALCIUM PO Take 1 tablet by mouth daily.     . cetirizine (ZYRTEC ALLERGY) 10 MG tablet Take 10 mg by mouth at bedtime.      . Cholecalciferol (VITAMIN D3) 2000 UNITS TABS Take 2,000 Units by mouth 2 (two) times daily.      . fish oil-omega-3 fatty acids 1000 MG capsule Take 2 capsules (2 g total) by mouth 2 (two) times daily. total for 4,000 mg (Patient taking differently: Take 2 g by mouth daily. total for 4,000 mg)    . GLUCOSAMINE SULFATE PO Take 2 tablets by mouth daily.     . meloxicam (MOBIC) 7.5 MG tablet TAKE ONE TABLET BY MOUTH TWICE DAILY 180 tablet 1  . Multiple Vitamin (MULTIVITAMIN) tablet Take 1 tablet by mouth daily.    . rosuvastatin (CRESTOR) 20 MG tablet Take 1 tablet (20 mg total) by mouth 2 (two) times a week. 10 tablet 3   No current facility-administered medications on file prior to visit.     Past Medical History  Diagnosis Date  . SHINGLES 11/08/2008    had vaccine at drugstore 12/14  . [place], [place] 02/09/2009  . Obesity,  unspecified 08/06/2008  . VARICOSE VEINS LOWER EXTREMITIES W/INFLAMMATION 02/05/2008  . ASTHMATIC BRONCHITIS, ACUTE 01/29/2008  . ASTHMA UNSPECIFIED WITH EXACERBATION 11/08/2008  . OSTEOARTHRITIS 04/24/2007  . Chest pain 03/2011    negative stress test - Dr. Ron Parker  . ASCUS on Pap smear 2007  . Osteopenia   . Kidney stones   . Cancer of skin of left ear      Past Surgical History  Procedure Laterality Date  . Knee arthroscopy    . Spine surgery  2008    lumbar  . Colposcopy      Neg HR HPV  . Bone spur shoulder      x2 removed     Family History  Problem Relation Age of Onset  . Ovarian cancer Mother   . Melanoma Mother   . Hypertension Father   . Heart disease Father     MI 24  . Diabetes Paternal Aunt   . Heart disease Paternal Grandfather   . Cancer Maternal Grandmother     Oral cancer  . Down syndrome Sister   . Prostate cancer Maternal Uncle   . Heart disease Maternal Grandfather   . Prostate cancer Maternal Uncle   . Prostate cancer Maternal Uncle   . Breast cancer Cousin     two maternal cousins diagnosed <50     History   Social  History  . Marital Status: Married    Spouse Name: N/A  . Number of Children: N/A  . Years of Education: N/A   Occupational History  . Not on file.   Social History Main Topics  . Smoking status: Former Research scientist (life sciences)  . Smokeless tobacco: Not on file     Comment: last exposure 1999  . Alcohol Use: 0.5 oz/week    1 Standard drinks or equivalent per week  . Drug Use: No  . Sexual Activity: Yes    Birth Control/ Protection: Post-menopausal   Other Topics Concern  . Not on file   Social History Narrative   Married 44 years in 2015. 2 kids. 2 grandkids.       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 It is negative  other than that mentioned in the history of present illness.   PHYSICAL EXAM   BP 110/78 mmHg  Pulse 62  Ht 5' 4.5" (1.638 m)  Wt 216 lb 12 oz (98.317 kg)  BMI 36.64 kg/m2 Constitutional: She is oriented to person, place, and time. She appears well-developed and well-nourished. No distress.  HENT: No nasal discharge.  Head: Normocephalic and atraumatic.  Eyes: Pupils are equal and round. No discharge.  Neck: Normal range of motion. Neck supple. No JVD present. No thyromegaly present.  Cardiovascular: Normal rate, regular rhythm, normal heart sounds. Exam reveals no gallop and no friction rub. No murmur heard.  Pulmonary/Chest: Effort normal and breath sounds normal. No stridor. No respiratory distress. She has no wheezes. She has no rales. She exhibits no tenderness.  Abdominal: Soft. Bowel sounds are normal. She exhibits no distension. There is no tenderness. There is no rebound and no guarding.  Musculoskeletal: Normal range of motion. She exhibits no edema and no tenderness.  Neurological: She is alert and oriented to person, place, and time. Coordination normal.  Skin: Skin is warm and dry. No rash noted. She is not diaphoretic. No erythema. No pallor.  Psychiatric: She has a normal mood and affect. Her behavior is normal. Judgment and thought content normal.     UDJ:SHFWY  Rhythm  Low voltage -possible pulmonary disease.   ABNORMAL     ASSESSMENT AND PLAN

## 2014-12-27 NOTE — Assessment & Plan Note (Signed)
Atypical chest pain which does not seem to be cardiac but she does have risk factors for coronary artery disease. I suggested evaluation with a stress test but she wants to hold off for now.

## 2015-02-15 ENCOUNTER — Other Ambulatory Visit: Payer: Self-pay | Admitting: Family Medicine

## 2015-04-22 ENCOUNTER — Encounter: Payer: Self-pay | Admitting: Family Medicine

## 2015-04-22 LAB — HM MAMMOGRAPHY: HM Mammogram: NORMAL

## 2015-06-17 ENCOUNTER — Ambulatory Visit (INDEPENDENT_AMBULATORY_CARE_PROVIDER_SITE_OTHER): Payer: Medicare Other | Admitting: Cardiovascular Disease

## 2015-06-17 ENCOUNTER — Encounter: Payer: Self-pay | Admitting: Cardiovascular Disease

## 2015-06-17 VITALS — BP 126/68 | HR 65 | Ht 64.5 in | Wt 215.5 lb

## 2015-06-17 DIAGNOSIS — R002 Palpitations: Secondary | ICD-10-CM | POA: Diagnosis not present

## 2015-06-17 DIAGNOSIS — E785 Hyperlipidemia, unspecified: Secondary | ICD-10-CM

## 2015-06-17 DIAGNOSIS — R0789 Other chest pain: Secondary | ICD-10-CM | POA: Diagnosis not present

## 2015-06-17 NOTE — Progress Notes (Signed)
HPI  This is a 69 year old female who is here today for follow-up regarding atypical chest pain. She has no previous cardiac history. She has known history of COPD from second hand smoking, and hyperlipidemia. She does have family history of coronary artery disease but not prematurely. She is a lifelong nonsmoker. She has no history of hypertension or diabetes.  She was seen 6 months ago for recurrent episodes of  substernal aching which can be on the right or left side. This is not exertional. This is usually associated with exertional dyspnea and occasional episodes of palpitations. She is known to have hyperlipidemia but she stopped taking rosuvastatin due to cost reasons.   Allergies  Allergen Reactions  . Cefuroxime Axetil Diarrhea and Nausea And Vomiting     Current Outpatient Prescriptions on File Prior to Visit  Medication Sig Dispense Refill  . aspirin 81 MG EC tablet Take 81 mg by mouth daily.      Marland Kitchen CALCIUM PO Take 1 tablet by mouth daily.     . cetirizine (ZYRTEC ALLERGY) 10 MG tablet Take 10 mg by mouth at bedtime.      . Cholecalciferol (VITAMIN D3) 2000 UNITS TABS Take 2,000 Units by mouth 2 (two) times daily.      . fish oil-omega-3 fatty acids 1000 MG capsule Take 2 capsules (2 g total) by mouth 2 (two) times daily. total for 4,000 mg (Patient taking differently: Take 2 g by mouth daily. total for 4,000 mg)    . GLUCOSAMINE SULFATE PO Take 2 tablets by mouth daily.     . meloxicam (MOBIC) 7.5 MG tablet TAKE ONE TABLET BY MOUTH TWICE DAILY 180 tablet 1  . Multiple Vitamin (MULTIVITAMIN) tablet Take 1 tablet by mouth daily.    Marland Kitchen QVAR 80 MCG/ACT inhaler INHALE TWO PUFFS BY MOUTH ONCE DAILY 1 Inhaler 9   No current facility-administered medications on file prior to visit.     Past Medical History  Diagnosis Date  . SHINGLES 11/08/2008    had vaccine at drugstore 12/14  . [place], [place] 02/09/2009  . Obesity, unspecified 08/06/2008  . VARICOSE VEINS LOWER  EXTREMITIES W/INFLAMMATION 02/05/2008  . ASTHMATIC BRONCHITIS, ACUTE 01/29/2008  . ASTHMA UNSPECIFIED WITH EXACERBATION 11/08/2008  . OSTEOARTHRITIS 04/24/2007  . Chest pain 03/2011    negative stress test - Dr. Ron Parker  . ASCUS on Pap smear 2007  . Osteopenia   . Kidney stones   . Cancer of skin of left ear      Past Surgical History  Procedure Laterality Date  . Knee arthroscopy    . Spine surgery  2008    lumbar  . Colposcopy      Neg HR HPV  . Bone spur shoulder      x2 removed     Family History  Problem Relation Age of Onset  . Ovarian cancer Mother   . Melanoma Mother   . Hypertension Father   . Heart disease Father     MI 13  . Diabetes Paternal Aunt   . Heart disease Paternal Grandfather   . Cancer Maternal Grandmother     Oral cancer  . Down syndrome Sister   . Prostate cancer Maternal Uncle   . Heart disease Maternal Grandfather   . Prostate cancer Maternal Uncle   . Prostate cancer Maternal Uncle   . Breast cancer Cousin     two maternal cousins diagnosed <50     Social History   Social History  . Marital  Status: Married    Spouse Name: N/A  . Number of Children: N/A  . Years of Education: N/A   Occupational History  . Not on file.   Social History Main Topics  . Smoking status: Former Research scientist (life sciences)  . Smokeless tobacco: Not on file     Comment: last exposure 1999  . Alcohol Use: 0.5 oz/week    1 Standard drinks or equivalent per week  . Drug Use: No  . Sexual Activity: Yes    Birth Control/ Protection: Post-menopausal   Other Topics Concern  . Not on file   Social History Narrative   Married 44 years in 2015. 2 kids. 2 grandkids.       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 It is negative other than that mentioned in the history  of present illness.   PHYSICAL EXAM   BP 126/68 mmHg  Pulse 65  Ht 5' 4.5" (1.638 m)  Wt 215 lb 8 oz (97.75 kg)  BMI 36.43 kg/m2 Constitutional: She is oriented to person, place, and time. She appears well-developed and well-nourished. No distress.  HENT: No nasal discharge.  Head: Normocephalic and atraumatic.  Eyes: Pupils are equal and round. No discharge.  Neck: Normal range of motion. Neck supple. No JVD present. No thyromegaly present.  Cardiovascular: Normal rate, regular rhythm, normal heart sounds. Exam reveals no gallop and no friction rub. No murmur heard.  Pulmonary/Chest: Effort normal and breath sounds normal. No stridor. No respiratory distress. She has no wheezes. She has no rales. She exhibits no tenderness.  Abdominal: Soft. Bowel sounds are normal. She exhibits no distension. There is no tenderness. There is no rebound and no guarding.  Musculoskeletal: Normal range of motion. She exhibits no edema and no tenderness.  Neurological: She is alert and oriented to person, place, and time. Coordination normal.  Skin: Skin is warm and dry. No rash noted. She is not diaphoretic. No erythema. No pallor.  Psychiatric: She has a normal mood and affect. Her behavior is normal. Judgment and thought content normal.     XTA:VWPVX  Rhythm  Low voltage -possible pulmonary disease.   ABNORMAL     ASSESSMENT AND PLAN

## 2015-06-17 NOTE — Patient Instructions (Addendum)
Medication Instructions:  Your physician recommends that you continue on your current medications as directed. Please refer to the Current Medication list given to you today.   Labwork: Your physician recommends that you have a FASTING lipid and liver profile today    Testing/Procedures: Your physician has requested that you have a lexiscan myoview. For further information please visit [email or web address]. Please follow instruction sheet, as given.  Tiro  Your caregiver has ordered a Stress Test with nuclear imaging. The purpose of this test is to evaluate the blood supply to your heart muscle. This procedure is referred to as a "Non-Invasive Stress Test." This is because other than having an IV started in your vein, nothing is inserted or "invades" your body. Cardiac stress tests are done to find areas of poor blood flow to the heart by determining the extent of coronary artery disease (CAD). Some patients exercise on a treadmill, which naturally increases the blood flow to your heart, while others who are  unable to walk on a treadmill due to physical limitations have a pharmacologic/chemical stress agent called Lexiscan . This medicine will mimic walking on a treadmill by temporarily increasing your coronary blood flow.   Please note: these test may take anywhere between 2-4 hours to complete  PLEASE REPORT TO [facility] TO GO  Date of Procedure: Thursday, Sept 8, 8:30am  Arrival Time for Procedure: 8:00am  Instructions regarding medication: You may take your morning medications with a sip of water    PLEASE NOTIFY THE OFFICE AT LEAST 24 HOURS IN ADVANCE IF YOU ARE UNABLE TO [place].  [phone] AND  PLEASE NOTIFY NUCLEAR MEDICINE AT [facility] AT LEAST 24 HOURS IN ADVANCE IF YOU ARE UNABLE TO KEEP YOUR APPOINTMENT. [phone]  How to prepare for your Myoview test:   Do not eat or drink  after midnight  No caffeine for 24 hours prior to test  No smoking 24 hours prior to test.  Your medication may be taken with water.  If your doctor stopped a medication because of this test, do not take that medication.  Ladies, please do not wear dresses.  Skirts or pants are appropriate. Please wear a short sleeve shirt.  No perfume, cologne or lotion.  Wear comfortable walking shoes. No heels!            Follow-Up: Your physician recommends that you schedule a follow-up appointment with Dr. Fletcher Anon as needed.    Any Other Special Instructions Will Be Listed Below (If Applicable).  Nuclear Medicine Exam A nuclear medicine exam is a safe and painless imaging test. It helps to detect and diagnose disease in the body as well as provide information about organ function and structure.  Nuclear scans are most often done of the:  Lungs.  Heart.  Thyroid gland.  Bones.  Abdomen. HOW A NUCLEAR MEDICINE EXAM WORKS A nuclear medicine exam works by using a radioactive tracer. The material is given either by an IV (intravenous) injection or it may be swallowed. After the tracer is in the body, it is absorbed by your body's organs. A large scanning machine that uses a special camera detects the radioactivity in your body. A computerized image is then formed regarding the area of concern. The small amounts of radioactive material used in a nuclear medicine exam are found to be medically safe. However, because radioactive material is used, this test is not done if you  are pregnant or nursing.  BEFORE THE PROCEDURE  If available, bring previous imaging studies such as x-rays, etc. with you to the exam.  Arrive early for your exam. PROCEDURE  An IV may be started before the exam begins.  Depending on the type of examination, will lie on a table or sit in a chair during the exam.  The nuclear medicine exam will take about 30 to 60 minutes to complete. AFTER THE PROCEDURE  After  your scan is completed, the image(s) will be evaluated by a specialist. It is important that you follow up with your caregiver to find out your test results.  You may return to your regular activity as instructed by your caregiver. SEEK IMMEDIATE MEDICAL CARE IF: You have shortness of breath or difficulty breathing. MAKE SURE YOU:   Understand these instructions.  Will watch your condition.  Will get help right away if you are not doing well or get worse. Document Released: 11/08/2004 Document Revised: 12/24/2011 Document Reviewed: 12/23/2008 [employer] Patient Information 2015 [place], [place]. This information is not intended to replace advice given to you by your health care provider. Make sure you discuss any questions you have with your health care provider.

## 2015-06-17 NOTE — Assessment & Plan Note (Signed)
I requested fasting lipid and liver profile as she has been off rosuvastatin. She was taking 20 mg 2-3 times per week. She reports having myalgia with atorvastatin. It might be easier for her not to get rosuvastatin given that it is generic.

## 2015-06-17 NOTE — Assessment & Plan Note (Signed)
Chest pain is overall atypical but has continued and she has multiple risk factors for coronary artery disease. Thus, I recommend evaluation with a pharmacologic nuclear stress test. She is not able to exercise on a treadmill due to severe bilateral knee arthritis.

## 2015-06-18 LAB — LIPID PANEL
CHOLESTEROL TOTAL: 221 mg/dL — AB (ref 100–199)
Chol/HDL Ratio: 3.3 ratio units (ref 0.0–4.4)
HDL: 67 mg/dL (ref >39)
LDL Calculated: 142 mg/dL — ABNORMAL HIGH (ref 0–99)
Triglycerides: 60 mg/dL (ref 0–149)
VLDL CHOLESTEROL CAL: 12 mg/dL (ref 5–40)

## 2015-06-18 LAB — HEPATIC FUNCTION PANEL
ALK PHOS: 100 IU/L (ref 39–117)
ALT: 22 IU/L (ref 0–32)
AST: 24 IU/L (ref 0–40)
Albumin: 4.4 g/dL (ref 3.6–4.8)
BILIRUBIN, DIRECT: 0.12 mg/dL (ref 0.00–0.40)
Bilirubin Total: 0.4 mg/dL (ref 0.0–1.2)
TOTAL PROTEIN: 6.7 g/dL (ref 6.0–8.5)

## 2015-06-22 ENCOUNTER — Telehealth: Payer: Self-pay

## 2015-06-22 NOTE — Telephone Encounter (Signed)
Pt states she has intestinal issues and allergies, and will be unable to make her stress test appt tomorrow. She will call back next week to r/s

## 2015-06-22 NOTE — Telephone Encounter (Signed)
Cancelled in system

## 2015-06-23 ENCOUNTER — Inpatient Hospital Stay: Admission: RE | Admit: 2015-06-23 | Payer: Medicare Other | Source: Ambulatory Visit

## 2015-06-24 ENCOUNTER — Other Ambulatory Visit: Payer: Self-pay

## 2015-06-24 MED ORDER — ROSUVASTATIN CALCIUM 10 MG PO TABS: 10.0000 mg | ORAL_TABLET | Freq: Every day | ORAL | Status: DC

## 2015-06-30 ENCOUNTER — Telehealth: Payer: Self-pay

## 2015-06-30 NOTE — Telephone Encounter (Signed)
S/w pt who would like to reschedule lexi myoview from her cancellation last week. Rescheduled for Sept 22, 8:30am, arrival 8:00am Reviewed instructions. Pt verbalized understanding

## 2015-07-07 ENCOUNTER — Ambulatory Visit
Admission: RE | Admit: 2015-07-07 | Discharge: 2015-07-07 | Disposition: A | Discharge status: Home / Self Care | Payer: Medicare Other | Source: Ambulatory Visit | Attending: Cardiovascular Disease | Admitting: Cardiovascular Disease

## 2015-07-07 DIAGNOSIS — R002 Palpitations: Secondary | ICD-10-CM | POA: Insufficient documentation

## 2015-07-07 DIAGNOSIS — R0789 Other chest pain: Secondary | ICD-10-CM | POA: Diagnosis not present

## 2015-07-07 LAB — NM MYOCAR MULTI W/SPECT W/WALL MOTION / EF
CHL CUP NUCLEAR SDS: 0
CHL CUP RESTING HR STRESS: 62 {beats}/min
LV sys vol: 36 mL
LVDIAVOL: 67 mL
NUC STRESS TID: 1
Peak HR: 96 {beats}/min
Percent HR: 63 %
SRS: 0
SSS: 1

## 2015-07-07 MED ORDER — TECHNETIUM TC 99M SESTAMIBI - CARDIOLITE
13.0000 | Freq: Once | INTRAVENOUS | Status: AC | PRN
  Administered 2015-07-07: 09:00:00 13.6 via INTRAVENOUS

## 2015-07-07 MED ORDER — TECHNETIUM TC 99M SESTAMIBI - CARDIOLITE
33.0000 | Freq: Once | INTRAVENOUS | Status: AC | PRN
  Administered 2015-07-07: 30.46 via INTRAVENOUS

## 2015-07-07 MED ORDER — REGADENOSON 0.4 MG/5ML IV SOLN
0.4000 mg | Freq: Once | INTRAVENOUS | Status: AC
  Administered 2015-07-07: 0.4 mg via INTRAVENOUS
  Filled 2015-07-07: qty 5

## 2015-07-20 ENCOUNTER — Ambulatory Visit: Payer: BC Managed Care – PPO | Admitting: Women's Health

## 2015-07-26 ENCOUNTER — Ambulatory Visit: Payer: Medicare Other | Admitting: Women's Health

## 2015-08-01 ENCOUNTER — Ambulatory Visit (INDEPENDENT_AMBULATORY_CARE_PROVIDER_SITE_OTHER): Payer: Medicare Other | Admitting: Family Medicine

## 2015-08-01 DIAGNOSIS — Z23 Encounter for immunization: Secondary | ICD-10-CM | POA: Diagnosis not present

## 2015-08-02 ENCOUNTER — Ambulatory Visit (INDEPENDENT_AMBULATORY_CARE_PROVIDER_SITE_OTHER): Payer: Medicare Other | Admitting: Women's Health

## 2015-08-02 ENCOUNTER — Encounter: Payer: Self-pay | Admitting: Women's Health

## 2015-08-02 VITALS — BP 128/80 | Ht 64.0 in | Wt 215.0 lb

## 2015-08-02 DIAGNOSIS — N898 Other specified noninflammatory disorders of vagina: Secondary | ICD-10-CM | POA: Diagnosis not present

## 2015-08-02 DIAGNOSIS — R1031 Right lower quadrant pain: Secondary | ICD-10-CM

## 2015-08-02 DIAGNOSIS — B373 Candidiasis of vulva and vagina: Secondary | ICD-10-CM

## 2015-08-02 DIAGNOSIS — B3731 Acute candidiasis of vulva and vagina: Secondary | ICD-10-CM

## 2015-08-02 LAB — WET PREP FOR TRICH, YEAST, CLUE
Clue Cells Wet Prep HPF POC: NONE SEEN
Trich, Wet Prep: NONE SEEN

## 2015-08-02 MED ORDER — FLUCONAZOLE 150 MG PO TABS: 150.0000 mg | ORAL_TABLET | Freq: Once | ORAL | Status: DC

## 2015-08-02 NOTE — Progress Notes (Signed)
Presents with intermittent right sided abdominal pain and tailbone pain rates at 2, with increased discharge with itching without  odor. Denies fever,  constipation or dysuria. Postmenopausal, no HRT/no bleeding. Last transvaginal US was 2 years ago, neg. Has never had a colonoscopy.   Exam: Appears well. No tenderness to palpation on abdominal exam in all quadrants.  External genitalia appear normal. On speculum exam, no lesions, erythema, minimal  discharge noted. No adnexal or uterine tenderness on bimanual exam. No masses noted on bimanual or rectal exam. Minimal to no discomfort with exam. Wet prep positive for yeast.   Vulvovaginal Candidiasis Lower right sided abdominal pain  Plan: Diflucan 150 by mouth times one dose. Loose clothes, keep dry. Instructed to call if no relief of symptoms.  Schedule transvaginal US. Ultrasound offered today unable to stay due to previous commitments. Overdue for colonoscopy instructed to schedule colonoscopy.

## 2015-08-02 NOTE — Patient Instructions (Signed)

## 2015-08-11 ENCOUNTER — Ambulatory Visit (INDEPENDENT_AMBULATORY_CARE_PROVIDER_SITE_OTHER): Payer: Medicare Other | Admitting: Women's Health

## 2015-08-11 ENCOUNTER — Ambulatory Visit (INDEPENDENT_AMBULATORY_CARE_PROVIDER_SITE_OTHER): Payer: Medicare Other

## 2015-08-11 ENCOUNTER — Encounter: Payer: Self-pay | Admitting: Women's Health

## 2015-08-11 VITALS — BP 118/80 | Ht 64.0 in | Wt 215.0 lb

## 2015-08-11 DIAGNOSIS — R1031 Right lower quadrant pain: Secondary | ICD-10-CM

## 2015-08-11 NOTE — Progress Notes (Signed)
Patient ID: [name], female   DOB: [birth year], 69 y.o.   MRN: [id number] Presents for ultrasound. SBE's having intermittent right lower quadrant pain. No rebound or radiation of pain with deep palpation. Reports pain as a 2 but had been  8-9. Denies nausea, vomiting, change in bowel elimination, urinary symptoms,  discharge or fever. Son died suddenly several months ago questionable MI.  Exam: Appears well. T/V anteverted uterus with normal echo pattern. Right and left ovary normal echo. Atrophic. Negative cul-de-sac, no apparent mass seen in right or left adnexal.  Normal GYN ultrasound Right lower quadrant pain  Plan: Reviewed normality of ultrasound. Strongly encouraged screening colonoscopy, has not had one. Will schedule.

## 2015-08-25 ENCOUNTER — Telehealth: Payer: Self-pay | Admitting: Family Medicine

## 2015-08-25 DIAGNOSIS — Z1211 Encounter for screening for malignant neoplasm of colon: Secondary | ICD-10-CM

## 2015-08-25 NOTE — Telephone Encounter (Signed)
Referral placed.

## 2015-08-25 NOTE — Telephone Encounter (Signed)
Pt call to say she was suppose to have a colonoscopy in 04-25-2023 but had death in the family and never scheduled. She called today to ask if this can set up again for her.

## 2015-08-26 ENCOUNTER — Encounter: Payer: Self-pay | Admitting: Gastroenterology

## 2015-08-29 ENCOUNTER — Other Ambulatory Visit: Payer: Self-pay | Admitting: Family Medicine

## 2015-09-01 ENCOUNTER — Encounter: Payer: Self-pay | Admitting: Women's Health

## 2015-09-01 ENCOUNTER — Ambulatory Visit (INDEPENDENT_AMBULATORY_CARE_PROVIDER_SITE_OTHER): Payer: Medicare Other | Admitting: Women's Health

## 2015-09-01 VITALS — BP 128/80 | Ht 64.0 in | Wt 215.0 lb

## 2015-09-01 DIAGNOSIS — Z01419 Encounter for gynecological examination (general) (routine) without abnormal findings: Secondary | ICD-10-CM | POA: Diagnosis not present

## 2015-09-01 DIAGNOSIS — N952 Postmenopausal atrophic vaginitis: Secondary | ICD-10-CM

## 2015-09-01 NOTE — Patient Instructions (Signed)

## 2015-09-01 NOTE — Progress Notes (Signed)
[name] 1946-10-08 [id number]    History:    Presents for breast and pelvic exam. Postmenopausal/no HRT/no bleeding. 2007 ascus with negative HR HPV with a negative colposcopy and biopsy, normal Paps after. Vaccines current. DEXA done at orthopedist. Colonoscopy scheduled January 2017.  Normal mammogram history. Hypercholesterolemia and COPD managed by primary care.  Was having right lower quadrant pain normal ultrasound reports pain has resolved.  Past medical history, past surgical history, family history and social history were all reviewed and documented in the EPIC chart. Retired Careers information officer. Daughter doing well. Husband recent MI and prostate cancer. May 05, 2015 son died suddenly of questionable MI age 26. Granddaughter age 19 died 79 years ago, mass shooting in [place].   ROS:  A ROS was performed and pertinent positives and negatives are included.  Exam:  Filed Vitals:   09/01/15 0910  BP: 128/80    General appearance:  Normal Thyroid:  Symmetrical, normal in size, without palpable masses or nodularity. Respiratory  Auscultation:  Clear without wheezing or rhonchi Cardiovascular  Auscultation:  Regular rate, without rubs, murmurs or gallops  Edema/varicosities:  Not grossly evident Abdominal  Soft,nontender, without masses, guarding or rebound.  Liver/spleen:  No organomegaly noted  Hernia:  None appreciated  Skin  Inspection:  Grossly normal   Breasts: Examined lying and sitting.     Right: Without masses, retractions, discharge or axillary adenopathy.     Left: Without masses, retractions, discharge or axillary adenopathy. Gentitourinary   Inguinal/mons:  Normal without inguinal adenopathy  External genitalia:  Normal  BUS/Urethra/Skene's glands:  Normal  Vagina:  Atrophic  Cervix:  Normal  Uterus:  normal in size, shape and contour.  Midline and mobile  Adnexa/parametria:     Rt: Without masses or tenderness.   Lt: Without masses or tenderness.  Anus  and perineum: Normal  Digital rectal exam: Normal sphincter tone without palpated masses or tenderness  Assessment/Plan:  69 y.o. MWF G2 P2 for breast and pelvic exam with no complaints.  Postmenopausal/no HRT/no bleeding Atrophic vaginitis Hypercholesterolemia, COPD-primary care manages labs and meds  Plan: Home safety, fall prevention and importance of weightbearing exercise reviewed. Decrease calories and continue active lifestyle for weight loss encouraged. Vitamin D 1000 daily encouraged. Continue annual flu vaccine. Vaginal lubricants with intercourse. UA. Condolences given recent death of son.   Huel Cote [facility], 12:44 PM 09/01/2015

## 2015-10-26 ENCOUNTER — Other Ambulatory Visit: Payer: Medicare Other

## 2015-10-26 ENCOUNTER — Other Ambulatory Visit (INDEPENDENT_AMBULATORY_CARE_PROVIDER_SITE_OTHER): Payer: Medicare Other

## 2015-10-26 DIAGNOSIS — Z0000 Encounter for general adult medical examination without abnormal findings: Secondary | ICD-10-CM | POA: Diagnosis not present

## 2015-10-26 DIAGNOSIS — Z1159 Encounter for screening for other viral diseases: Secondary | ICD-10-CM

## 2015-10-26 DIAGNOSIS — E785 Hyperlipidemia, unspecified: Secondary | ICD-10-CM

## 2015-10-26 LAB — BASIC METABOLIC PANEL
BUN: 23 mg/dL (ref 6–23)
CHLORIDE: 105 meq/L (ref 96–112)
CO2: 31 mEq/L (ref 19–32)
Calcium: 9.1 mg/dL (ref 8.4–10.5)
Creatinine, Ser: 0.69 mg/dL (ref 0.40–1.20)
GFR: 89.69 mL/min (ref >60.00)
Glucose, Bld: 88 mg/dL (ref 70–99)
POTASSIUM: 4 meq/L (ref 3.5–5.1)
Sodium: 141 mEq/L (ref 135–145)

## 2015-10-26 LAB — CBC WITH DIFFERENTIAL/PLATELET
BASOS PCT: 0.9 % (ref 0.0–3.0)
Basophils Absolute: 0 10*3/uL (ref 0.0–0.1)
EOS PCT: 2.3 % (ref 0.0–5.0)
Eosinophils Absolute: 0.1 10*3/uL (ref 0.0–0.7)
HEMATOCRIT: 41 % (ref 36.0–46.0)
HEMOGLOBIN: 13.5 g/dL (ref 12.0–15.0)
LYMPHS PCT: 23.5 % (ref 12.0–46.0)
Lymphs Abs: 1.1 10*3/uL (ref 0.7–4.0)
MCHC: 32.8 g/dL (ref 30.0–36.0)
MCV: 95.7 fl (ref 78.0–100.0)
MONOS PCT: 7.1 % (ref 3.0–12.0)
Monocytes Absolute: 0.3 10*3/uL (ref 0.1–1.0)
Neutro Abs: 3 10*3/uL (ref 1.4–7.7)
Neutrophils Relative %: 66.2 % (ref 43.0–77.0)
Platelets: 171 10*3/uL (ref 150.0–400.0)
RBC: 4.28 Mil/uL (ref 3.87–5.11)
RDW: 13.4 % (ref 11.5–15.5)
WBC: 4.5 10*3/uL (ref 4.0–10.5)

## 2015-10-26 LAB — POCT URINALYSIS DIPSTICK
BILIRUBIN UA: NEGATIVE
GLUCOSE UA: NEGATIVE
KETONES UA: NEGATIVE
NITRITE UA: NEGATIVE
Protein, UA: NEGATIVE
Spec Grav, UA: 1.02
Urobilinogen, UA: 0.2
pH, UA: 6.5

## 2015-10-26 LAB — LIPID PANEL
CHOLESTEROL: 141 mg/dL (ref 0–200)
HDL: 59.3 mg/dL (ref >39.00)
LDL CALC: 66 mg/dL (ref 0–99)
NonHDL: 81.21
Total CHOL/HDL Ratio: 2
Triglycerides: 74 mg/dL (ref 0.0–149.0)
VLDL: 14.8 mg/dL (ref 0.0–40.0)

## 2015-10-26 LAB — HEPATIC FUNCTION PANEL
ALBUMIN: 4 g/dL (ref 3.5–5.2)
ALT: 20 U/L (ref 0–35)
AST: 20 U/L (ref 0–37)
Alkaline Phosphatase: 81 U/L (ref 39–117)
Bilirubin, Direct: 0.1 mg/dL (ref 0.0–0.3)
Total Bilirubin: 0.6 mg/dL (ref 0.2–1.2)
Total Protein: 6.6 g/dL (ref 6.0–8.3)

## 2015-10-26 LAB — URINALYSIS, MICROSCOPIC ONLY

## 2015-10-26 LAB — TSH: TSH: 1.53 u[IU]/mL (ref 0.35–4.50)

## 2015-10-27 LAB — HEPATITIS C ANTIBODY: HCV AB: NEGATIVE

## 2015-10-28 ENCOUNTER — Ambulatory Visit (AMBULATORY_SURGERY_CENTER): Payer: Self-pay | Admitting: *Deleted

## 2015-10-28 DIAGNOSIS — Z1211 Encounter for screening for malignant neoplasm of colon: Secondary | ICD-10-CM

## 2015-10-28 NOTE — Progress Notes (Signed)
Pt here for PV for screening colonoscopy.  Pt says that she is having trouble with food getting stuck about once a week and would like to have that checked.  If EGD is needed, pt prefers to have EGD and colonoscopy at the same time.  Pt scheduled for new patient appointment with Alonza Bogus for evaluation of dysphagia.  Colonoscopy scheduled for 11/11/15 cancelled.

## 2015-10-29 LAB — URINE CULTURE

## 2015-10-31 ENCOUNTER — Ambulatory Visit (INDEPENDENT_AMBULATORY_CARE_PROVIDER_SITE_OTHER): Payer: Medicare Other | Admitting: Family Medicine

## 2015-10-31 ENCOUNTER — Encounter: Payer: Self-pay | Admitting: Family Medicine

## 2015-10-31 VITALS — BP 138/82 | HR 74 | Temp 98.0°F | Wt 225.0 lb

## 2015-10-31 DIAGNOSIS — Z1211 Encounter for screening for malignant neoplasm of colon: Secondary | ICD-10-CM

## 2015-10-31 DIAGNOSIS — Z23 Encounter for immunization: Secondary | ICD-10-CM

## 2015-10-31 DIAGNOSIS — Z0000 Encounter for general adult medical examination without abnormal findings: Secondary | ICD-10-CM | POA: Diagnosis not present

## 2015-10-31 MED ORDER — FLUTICASONE PROPIONATE HFA 110 MCG/ACT IN AERO: 2.0000 | INHALATION_SPRAY | Freq: Two times a day (BID) | RESPIRATORY_TRACT | Status: DC

## 2015-10-31 MED ORDER — NITROFURANTOIN MONOHYD MACRO 100 MG PO CAPS: 100.0000 mg | ORAL_CAPSULE | Freq: Two times a day (BID) | ORAL | Status: DC

## 2015-10-31 NOTE — Patient Instructions (Addendum)
Final pneumonia shot received today [id number]).  Glad you are getting your colonoscopy soon  Nitrofurantoin sent to pharmacy to take for 7 days. If does not resolve symptoms please let us know.   Changed qvar to flovent.   Sign release of information at the check out desk for bone density test from [place] only  Glad you are working to get the extra #s back off. Some good fudge during a time of mourning is very reasonable

## 2015-10-31 NOTE — Progress Notes (Signed)
Linda Reddish, MD Phone: [phone]  Subjective:  Patient presents today for their annual physical. Chief complaint-noted.   See problem oriented charting- ROS- full  review of systems was completed and negative except for: some pain with swallowing (will be seeing GI for this), shortness of breath controlled/prevented with flovent notes urinary frequency  The following were reviewed and entered/updated in epic: Past Medical History  Diagnosis Date  . SHINGLES 11/08/2008    had vaccine at drugstore 12/14  . [place], [place] 02/09/2009  . VARICOSE VEINS LOWER EXTREMITIES W/INFLAMMATION 02/05/2008  . ASTHMA UNSPECIFIED WITH EXACERBATION 11/08/2008  . OSTEOARTHRITIS 04/24/2007  . Chest pain 03/2011    negative stress test - Dr. Ron Parker  . ASCUS on Pap smear 2007  . Osteopenia   . Kidney stones   . Cancer of skin of left ear    Patient Active Problem List   Diagnosis Date Noted  . Hyperlipidemia with target LDL less than 100 02/09/2009    Priority: Medium  . COPD (chronic obstructive pulmonary disease) ([place]) 02/05/2008    Priority: Medium  . Obesity, unspecified 06/17/2014    Priority: Low  . Kidney stones 06/17/2014    Priority: Low  . Osteopenia     Priority: Low  . ALLERGIC RHINITIS 05/07/2008    Priority: Low  . VARICOSE VEINS LOWER EXTREMITIES W/INFLAMMATION 02/05/2008    Priority: Low  . OSTEOARTHRITIS 04/24/2007    Priority: Low  . Atypical chest pain 12/27/2014  . Palpitations 12/27/2014   Past Surgical History  Procedure Laterality Date  . Knee arthroscopy    . Spine surgery  2008    lumbar  . Colposcopy      Neg HR HPV  . Bone spur shoulder      x2 removed    Family History  Problem Relation Age of Onset  . Ovarian cancer Mother   . Melanoma Mother   . Hypertension Father   . Heart disease Father     MI 67  . Diabetes Paternal Aunt   . Heart disease Paternal Grandfather   . Cancer Maternal Grandmother     Oral cancer  . Down syndrome  Sister   . Prostate cancer Maternal Uncle   . Heart disease Maternal Grandfather   . Prostate cancer Maternal Uncle   . Prostate cancer Maternal Uncle   . Breast cancer Cousin     two maternal cousins diagnosed <50    Medications- reviewed and updated Current Outpatient Prescriptions  Medication Sig Dispense Refill  . aspirin 81 MG EC tablet Take 81 mg by mouth daily.      Marland Kitchen CALCIUM PO Take 1 tablet by mouth daily.     . cetirizine (ZYRTEC ALLERGY) 10 MG tablet Take 10 mg by mouth at bedtime.      . Cholecalciferol (VITAMIN D3) 2000 UNITS TABS Take 2,000 Units by mouth 2 (two) times daily.      . fish oil-omega-3 fatty acids 1000 MG capsule Take 2 capsules (2 g total) by mouth 2 (two) times daily. total for 4,000 mg (Patient taking differently: Take 2 g by mouth daily. total for 4,000 mg)    . GLUCOSAMINE SULFATE PO Take 2 tablets by mouth daily.     . meloxicam (MOBIC) 7.5 MG tablet TAKE ONE TABLET BY MOUTH TWICE DAILY. 180 tablet 5  . Multiple Vitamin (MULTIVITAMIN) tablet Take 1 tablet by mouth daily.    Marland Kitchen QVAR 80 MCG/ACT inhaler INHALE TWO PUFFS BY MOUTH ONCE DAILY 1 Inhaler 9  .  rosuvastatin (CRESTOR) 10 MG tablet Take 1 tablet (10 mg total) by mouth daily. 30 tablet 5   No current facility-administered medications for this visit.    Allergies-reviewed and updated Allergies  Allergen Reactions  . Atorvastatin     Myalgia  . Cefuroxime Axetil Diarrhea and Nausea And Vomiting    Social History   Social History  . Marital Status: Married    Spouse Name: N/A  . Number of Children: N/A  . Years of Education: N/A   Social History Main Topics  . Smoking status: Former Research scientist (life sciences)  . Smokeless tobacco: None     Comment: last exposure 1999  . Alcohol Use: 0.5 oz/week    1 Standard drinks or equivalent per week  . Drug Use: No  . Sexual Activity: Yes    Birth Control/ Protection: Post-menopausal   Other Topics Concern  . None   Social History Narrative   Married 44 years  in 2015. 2 kids. 2 grandkids.       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       Hobbies: works with grandsons in [place], Haematologist (hates housework), goes on 1 month motorcyle biking trip each year    ROS--See HPI   Objective: BP 138/82 mmHg  Pulse 74  Temp(Src) 98 F (36.7 C)  Wt 225 lb (102.059 kg) Gen: NAD, resting comfortably HEENT: Mucous membranes are moist. Oropharynx normal Neck: no thyromegaly CV: RRR no murmurs rubs or gallops Lungs: CTAB no crackles, wheeze, rhonchi Abdomen: soft/nontender/nondistended/normal bowel sounds. No rebound or guarding. Obese.  Ext: no edema Skin: warm, dry Neuro: grossly normal, moves all extremities, PERRLA  Assessment/Plan:  70 y.o. female presenting for annual physical.  Health Maintenance counseling: 1. Anticipatory guidance: Patient counseled regarding regular dental exams, eye exams, wearing seatbelts.  2. Risk factor reduction:  Advised patient of need for regular exercise and diet rich and fruits and vegetables to reduce risk of heart attack and stroke. Planning paleo and water aerobics to help get weight back down.  3. Immunizations/screenings/ancillary studies- prevnar 13 today Immunization History  Administered Date(s) Administered  . Influenza Whole 08/15/2000, 08/06/2008  . Influenza, High Dose Seasonal PF 10/25/2014  . Influenza,inj,Quad PF,36+ Mos 08/01/2015  . PPD Test 07/05/2014  . Pneumococcal Conjugate-13 10/31/2015  . Pneumococcal Polysaccharide-23 08/15/2000, 06/08/2013  . Td 09/14/1998, 02/05/2008  . Zoster 11/06/2013   Health Maintenance Due  Topic Date Due  . COLONOSCOPY - see below- has planned for later this month 12/09/1996  . DEXA SCAN - request recors 12/10/2011   4. Cervical cancer screening- no longer indicated but seeing GYN 5. Breast cancer screening-  breast exam with GYN and mammogram 04/22/15 6. Colon cancer screening - upcoming  UTI S: urgency for  several weeks. Not worsening. E. Coli UTI pan sensitive A/P: Macrobid x 7 days  COPD S: qvar no longer covered but arnuity and flovent are. Good control of symptoms at present- does not have to use any albuterol- in fact has run out.  A/P: change to flovent  Colonoscopy postponed due to some pain with swallowing in her chest- she is going to see GI about this and consider endoscopy  Hyperlipidemia- looks great with daily crestor  Had stress test due to some atypical chest pain which has now resolved. It was normal.   1 year CPE Return precautions advised.   Orders Placed This Encounter  Procedures  . Pneumococcal conjugate vaccine 13-valent

## 2015-11-02 ENCOUNTER — Encounter: Payer: Self-pay | Admitting: Family Medicine

## 2015-11-02 ENCOUNTER — Encounter: Payer: Self-pay | Admitting: Gastroenterology

## 2015-11-02 ENCOUNTER — Ambulatory Visit (INDEPENDENT_AMBULATORY_CARE_PROVIDER_SITE_OTHER): Payer: Medicare Other | Admitting: Gastroenterology

## 2015-11-02 VITALS — BP 120/76 | HR 72 | Ht 64.0 in | Wt 224.4 lb

## 2015-11-02 DIAGNOSIS — R1310 Dysphagia, unspecified: Secondary | ICD-10-CM | POA: Diagnosis not present

## 2015-11-02 DIAGNOSIS — Z1211 Encounter for screening for malignant neoplasm of colon: Secondary | ICD-10-CM | POA: Diagnosis not present

## 2015-11-02 MED ORDER — NA SULFATE-K SULFATE-MG SULF 17.5-3.13-1.6 GM/177ML PO SOLN: 1.0000 | ORAL | Status: DC

## 2015-11-02 NOTE — Patient Instructions (Signed)

## 2015-11-11 ENCOUNTER — Encounter: Payer: Medicare Other | Admitting: Gastroenterology

## 2015-11-14 ENCOUNTER — Encounter: Payer: Self-pay | Admitting: Gastroenterology

## 2015-11-14 DIAGNOSIS — R1310 Dysphagia, unspecified: Secondary | ICD-10-CM | POA: Insufficient documentation

## 2015-11-14 DIAGNOSIS — Z1211 Encounter for screening for malignant neoplasm of colon: Secondary | ICD-10-CM | POA: Insufficient documentation

## 2015-11-14 NOTE — Progress Notes (Signed)
11/02/2015 [name] [id number] [birth year]   HISTORY OF PRESENT ILLNESS:  This is a pleasant 70 year old female who is new to our practice.  She presents to our office today for complaints of dysphagia.  Had a PV to schedule screening colonoscopy, but mentioned that she had some issues with swallowing that she wanted evaluated as well and was questioning EGD with colonoscopy.  Tells me that she has been having issues with solid food getting stuck about once a week.  This as been occurring for about 3 months now.  Causes chest pain when this occurs.  No issues with swallowing liquids.  No weight loss.  CBC, BMP, TSH, and hepatic function panel all WNL's.  She is taking meloxicam twice daily.  Not on any reflux medication.  Denies heartburn/reflux/indigestion.  Has never undergone colonoscopy in the past.  Has no lower GI complaints.   Past Medical History  Diagnosis Date  . SHINGLES 11/08/2008    had vaccine at drugstore 12/14  . [place], [place] 02/09/2009  . VARICOSE VEINS LOWER EXTREMITIES W/INFLAMMATION 02/05/2008  . ASTHMA UNSPECIFIED WITH EXACERBATION 11/08/2008  . OSTEOARTHRITIS 04/24/2007  . Chest pain 03/2011    negative stress test - Dr. Ron Parker  . ASCUS on Pap smear 2007  . Osteopenia   . Kidney stones   . Cancer of skin of left ear    Past Surgical History  Procedure Laterality Date  . Knee arthroscopy    . Spine surgery  2008    lumbar  . Colposcopy      Neg HR HPV  . Bone spur shoulder      x2 removed    reports that she has been passively smoking.  She has never used smokeless tobacco. She reports that she drinks about 0.6 oz of alcohol per week. She reports that she does not use illicit drugs. family history includes Breast cancer in her cousin; Cancer in her maternal grandmother; Diabetes in her paternal aunt; Down syndrome in her sister; Heart disease in her father, maternal grandfather, and paternal grandfather; Hypertension in her father; Melanoma  in her mother; Ovarian cancer in her mother; Prostate cancer in her maternal uncle, maternal uncle, and maternal uncle; Sudden Cardiac Death in her son. Allergies  Allergen Reactions  . Atorvastatin     Myalgia  . Cefuroxime Axetil Diarrhea and Nausea And Vomiting      Outpatient Encounter Prescriptions as of 11/02/2015  Medication Sig  . aspirin 81 MG EC tablet Take 81 mg by mouth daily.    Marland Kitchen CALCIUM PO Take 1 tablet by mouth daily.   . cetirizine (ZYRTEC ALLERGY) 10 MG tablet Take 10 mg by mouth at bedtime.    . Cholecalciferol (VITAMIN D3) 2000 UNITS TABS Take 2,000 Units by mouth 2 (two) times daily.    . fish oil-omega-3 fatty acids 1000 MG capsule Take 2 capsules (2 g total) by mouth 2 (two) times daily. total for 4,000 mg (Patient taking differently: Take 2 g by mouth daily. total for 4,000 mg)  . fluticasone (FLOVENT HFA) 110 MCG/ACT inhaler Inhale 2 puffs into the lungs 2 (two) times daily.  Marland Kitchen GLUCOSAMINE SULFATE PO Take 2 tablets by mouth daily.   . meloxicam (MOBIC) 7.5 MG tablet TAKE ONE TABLET BY MOUTH TWICE DAILY.  . Multiple Vitamin (MULTIVITAMIN) tablet Take 1 tablet by mouth daily.  . nitrofurantoin, macrocrystal-monohydrate, (MACROBID) 100 MG capsule Take 1 capsule (100 mg total) by mouth 2 (two) times daily.  Marland Kitchen  rosuvastatin (CRESTOR) 10 MG tablet Take 1 tablet (10 mg total) by mouth daily.  . Na Sulfate-K Sulfate-Mg Sulf SOLN Take 1 kit by mouth as directed.  . [DISCONTINUED] QVAR 80 MCG/ACT inhaler INHALE TWO PUFFS BY MOUTH ONCE DAILY   No facility-administered encounter medications on file as of 11/02/2015.     REVIEW OF SYSTEMS  : All other systems reviewed and negative except where noted in the History of Present Illness.   PHYSICAL EXAM: BP 120/76 mmHg  Pulse 72  Ht 5' 4"  (1.626 m)  Wt 224 lb 6.4 oz (101.787 kg)  BMI 38.50 kg/m2 General: Well developed white female in no acute distress Head: Normocephalic and atraumatic Eyes:  Sclerae anicteric,  conjunctiva pink. Ears: Normal auditory acuity Lungs: Clear throughout to auscultation Heart: Regular rate and rhythm Abdomen: Soft, non-distended.  Normal bowel sounds Rectal:  Will be done at the time of colonoscopy. Musculoskeletal: Symmetrical with no gross deformities  Skin: No lesions on visible extremities Extremities: No edema  Neurological: Alert oriented x 4, grossly non-focal Psychological:  Alert and cooperative. Normal mood and affect  ASSESSMENT AND PLAN: -Dysphagia:  To solid food x 3 months.  Rule out stricture, esophagitis, neoplasm, etc.  ? Dysmotility.   -Screening colonoscopy:  Will schedule with Dr. Silverio Decamp as well.    The risks, benefits, and alternatives to EGD and colonoscopy were discussed with the patient and she consents to proceed.   CC:  Marin Olp, MD

## 2015-11-15 NOTE — Progress Notes (Signed)
Reviewed and agree with documentation and assessment and plan. K. Veena Panagiota Perfetti , MD   

## 2015-11-21 ENCOUNTER — Encounter (INDEPENDENT_AMBULATORY_CARE_PROVIDER_SITE_OTHER): Payer: Medicare Other | Admitting: Ophthalmology

## 2015-11-21 DIAGNOSIS — H353131 Nonexudative age-related macular degeneration, bilateral, early dry stage: Secondary | ICD-10-CM | POA: Diagnosis not present

## 2015-11-21 DIAGNOSIS — H26492 Other secondary cataract, left eye: Secondary | ICD-10-CM

## 2015-11-21 DIAGNOSIS — H43813 Vitreous degeneration, bilateral: Secondary | ICD-10-CM

## 2015-12-01 ENCOUNTER — Encounter: Payer: Self-pay | Admitting: Gastroenterology

## 2015-12-01 ENCOUNTER — Ambulatory Visit (AMBULATORY_SURGERY_CENTER): Payer: Medicare Other | Admitting: Gastroenterology

## 2015-12-01 VITALS — BP 122/75 | HR 63 | Temp 96.9°F | Resp 22 | Ht 64.0 in | Wt 224.0 lb

## 2015-12-01 DIAGNOSIS — R1310 Dysphagia, unspecified: Secondary | ICD-10-CM | POA: Diagnosis not present

## 2015-12-01 DIAGNOSIS — Z1211 Encounter for screening for malignant neoplasm of colon: Secondary | ICD-10-CM | POA: Diagnosis not present

## 2015-12-01 MED ORDER — SODIUM CHLORIDE 0.9 % IV SOLN: 500.0000 mL | INTRAVENOUS | Status: DC

## 2015-12-01 NOTE — Progress Notes (Signed)
Patient awakening,vss,report to rn 

## 2015-12-01 NOTE — Patient Instructions (Signed)

## 2015-12-01 NOTE — Op Note (Signed)
[place]  [address] [place], [address]   ENDOSCOPY PROCEDURE REPORT  PATIENT: [name], [name]  MR#: [id number] BIRTHDATE: Dec 01, 1945 , 69  yrs. old GENDER: female ENDOSCOPIST: Harl Bowie, MD REFERRED BY:  Marin Olp, M.D. PROCEDURE DATE:  12/01/2015 PROCEDURE:  EGD, diagnostic ASA CLASS:     Class II INDICATIONS:  dysphagia. MEDICATIONS: Propofol 100 mg IV TOPICAL ANESTHETIC: none  DESCRIPTION OF PROCEDURE: After the risks benefits and alternatives of the procedure were thoroughly explained, informed consent was obtained.  The LB [id number] endoscope was introduced through the mouth and advanced to the second portion of the duodenum , Without limitations.  The instrument was slowly withdrawn as the mucosa was fully examined.   EXAM: The esophagus and gastroesophageal junction were completely normal in appearance.  The stomach was entered and closely examined.The antrum, angularis, and lesser curvature were well visualized, including a retroflexed view of the cardia and fundus. The stomach wall was normally distensable.  The scope passed easily through the pylorus into the duodenum.  Retroflexed views revealed a hiatal hernia.     The scope was then withdrawn from the patient and the procedure completed.  COMPLICATIONS: There were no immediate complications.  ENDOSCOPIC IMPRESSION: Normal appearing esophagus and GE junction, non obstructive schatzki's ring, the stomach was well visualized and normal in appearance, normal appearing duodenum  RECOMMENDATIONS: Anti-reflux regimen to be follow   eSigned:  Harl Bowie, MD 12/01/2015 3:03 PM

## 2015-12-01 NOTE — Op Note (Signed)
[place]  [address] [place], [address]   COLONOSCOPY PROCEDURE REPORT  PATIENT: [name], [name]  MR#: [id number] BIRTHDATE: [birth year] , 69  yrs. old GENDER: female ENDOSCOPIST: Harl Bowie, MD REFERRED [id number] Kristian Covey, M.D. PROCEDURE DATE:  12/01/2015 PROCEDURE:   Colonoscopy, screening First Screening Colonoscopy - Avg.  risk and is 50 yrs.  old or older Yes.  Prior Negative Screening - Now for repeat screening. N/A  History of Adenoma - Now for follow-up colonoscopy & has been > or = to 3 yrs.  N/A  Polyps removed today? No Recommend repeat exam, <10 yrs? No ASA CLASS:   Class II INDICATIONS:Screening for colonic neoplasia and Colorectal Neoplasm Risk Assessment for this procedure is average risk. MEDICATIONS: Propofol 150 mg IV  DESCRIPTION OF PROCEDURE:   After the risks benefits and alternatives of the procedure were thoroughly explained, informed consent was obtained.  The digital rectal exam revealed no abnormalities of the rectum.   The LB [id number] [id number] endoscope was introduced through the anus and advanced to the terminal ileum which was intubated for a short distance. No adverse events experienced.   The quality of the prep was good.  The instrument was then slowly withdrawn as the colon was fully examined. Estimated blood loss is zero unless otherwise noted in this procedure report.   COLON FINDINGS: There was mild diverticulosis noted in the sigmoid colon.   Small internal hemorrhoids were found.   The examination was otherwise normal.  Retroflexed views revealed internal hemorrhoids. The time to cecum = 4.3 Withdrawal time = 6.0   The scope was withdrawn and the procedure completed. COMPLICATIONS: There were no immediate complications.  ENDOSCOPIC IMPRESSION: 1.   Mild diverticulosis was noted in the sigmoid colon 2.   Small internal hemorrhoids 3.   The examination was otherwise normal  RECOMMENDATIONS: You should  continue to follow colorectal cancer screening guidelines for "routine risk" patients with a repeat colonoscopy in 10 years. There is no need for FOBT (stool) testing for at least 5 years.  eSigned:  Harl Bowie, MD 12/01/2015 3:25 PM

## 2015-12-02 ENCOUNTER — Telehealth: Payer: Self-pay | Admitting: Emergency Medicine

## 2015-12-02 NOTE — Telephone Encounter (Signed)
  Follow up Call-  Call back number 12/01/2015  Post procedure Call Back phone  # [phone]  Permission to leave phone message Yes     Patient questions:  Do you have a fever, pain , or abdominal swelling? No. Pain Score  0 *  Have you tolerated food without any problems? Yes.    Have you been able to return to your normal activities? Yes.    Do you have any questions about your discharge instructions: Diet   No. Medications  No. Follow up visit  No.  Do you have questions or concerns about your Care? No.  Actions: * If pain score is 4 or above: No action needed, pain <4.

## 2015-12-05 ENCOUNTER — Ambulatory Visit (INDEPENDENT_AMBULATORY_CARE_PROVIDER_SITE_OTHER): Payer: Medicare Other | Admitting: Ophthalmology

## 2015-12-05 DIAGNOSIS — H2702 Aphakia, left eye: Secondary | ICD-10-CM

## 2016-01-13 ENCOUNTER — Other Ambulatory Visit: Payer: Self-pay | Admitting: Cardiovascular Disease

## 2016-02-29 ENCOUNTER — Encounter: Payer: Self-pay | Admitting: Family Medicine

## 2016-02-29 ENCOUNTER — Ambulatory Visit (INDEPENDENT_AMBULATORY_CARE_PROVIDER_SITE_OTHER): Payer: Medicare Other | Admitting: Family Medicine

## 2016-02-29 VITALS — BP 110/80 | HR 80 | Temp 97.6°F | Ht 64.0 in | Wt 229.0 lb

## 2016-02-29 DIAGNOSIS — J0100 Acute maxillary sinusitis, unspecified: Secondary | ICD-10-CM

## 2016-02-29 DIAGNOSIS — J432 Centrilobular emphysema: Secondary | ICD-10-CM

## 2016-02-29 MED ORDER — AMOXICILLIN-POT CLAVULANATE 875-125 MG PO TABS: 1.0000 | ORAL_TABLET | Freq: Two times a day (BID) | ORAL | Status: DC

## 2016-02-29 NOTE — Patient Instructions (Signed)
Sinsusitis May beBacterial based on double sickening (improving then worsened again). We opted to have patient use mucinex for 2 days. If she worsens or is not at least slowly improving, go ahead and take Augmentin antibiotic. If symptoms are not at least 50% better in next 5 days- take antibiotic regardless.   Finally, we reviewed reasons to return to care including if symptoms worsen or persist or new concerns arise especially fever or shortness of breath  Meds ordered this encounter  . amoxicillin-clavulanate (AUGMENTIN) 875-125 MG tablet    Sig: Take 1 tablet by mouth 2 (two) times daily.    Dispense:  14 tablet    Refill:  0

## 2016-02-29 NOTE — Progress Notes (Signed)
PCP: Garret Reddish, MD  Subjective:  [name] is a 70 y.o. year old very pleasant female patient who presents with sinusitis symptoms including nasal congestion with minimal sinus tenderness -other symptoms include: allergies are kicking in per patient and has been spending a lot of time outdoors. Increased sinus congestion and coughing up green at other times dry cough. Started Saturday night. Day 5 today. Had some right earache now improved. No fever. No shortness of breath. No wheeze. Taking zyrtec daily. No nasal spray.  -Symptoms are worsening after initially improving -previous treatments: continued zyrtec -sick contacts/travel/risks: denies flu exposure or sick contact -Hx of: allergies, yes  ROS-denies fever, SOB, NVD, tooth pain  Pertinent Past Medical History-  Patient Active Problem List   Diagnosis Date Noted  . Hyperlipidemia with target LDL less than 100 02/09/2009    Priority: Medium  . COPD (chronic obstructive pulmonary disease) ([place]) 02/05/2008    Priority: Medium  . Obesity, unspecified 06/17/2014    Priority: Low  . Kidney stones 06/17/2014    Priority: Low  . Osteopenia     Priority: Low  . ALLERGIC RHINITIS 05/07/2008    Priority: Low  . VARICOSE VEINS LOWER EXTREMITIES W/INFLAMMATION 02/05/2008    Priority: Low  . OSTEOARTHRITIS 04/24/2007    Priority: Low  . Dysphagia 11/14/2015  . Special screening for malignant neoplasms, colon 11/14/2015  . Atypical chest pain 12/27/2014  . Palpitations 12/27/2014    Medications- reviewed  Current Outpatient Prescriptions  Medication Sig Dispense Refill  . aspirin 81 MG EC tablet Take 81 mg by mouth daily.      Marland Kitchen CALCIUM PO Take 1 tablet by mouth daily.     . cetirizine (ZYRTEC ALLERGY) 10 MG tablet Take 10 mg by mouth at bedtime.      . Cholecalciferol (VITAMIN D3) 2000 UNITS TABS Take 2,000 Units by mouth 2 (two) times daily.      . fish oil-omega-3 fatty acids 1000 MG capsule Take 2 capsules (2 g total)  by mouth 2 (two) times daily. total for 4,000 mg (Patient taking differently: Take 2 g by mouth daily. total for 4,000 mg)    . fluticasone (FLOVENT HFA) 110 MCG/ACT inhaler Inhale 2 puffs into the lungs 2 (two) times daily. 1 Inhaler 12  . GLUCOSAMINE SULFATE PO Take 2 tablets by mouth daily.     . meloxicam (MOBIC) 7.5 MG tablet TAKE ONE TABLET BY MOUTH TWICE DAILY. 180 tablet 5  . Multiple Vitamin (MULTIVITAMIN) tablet Take 1 tablet by mouth daily.    . Multiple Vitamins-Minerals (ICAPS AREDS FORMULA PO) Take by mouth daily.    . rosuvastatin (CRESTOR) 10 MG tablet TAKE ONE TABLET BY MOUTH ONCE DAILY 30 tablet 3  . amoxicillin-clavulanate (AUGMENTIN) 875-125 MG tablet Take 1 tablet by mouth 2 (two) times daily. 14 tablet 0   No current facility-administered medications for this visit.    Objective: BP 110/80 mmHg  Pulse 80  Temp(Src) 97.6 F (36.4 C) (Oral)  Ht 5\' 4"  (1.626 m)  Wt 229 lb (103.874 kg)  BMI 39.29 kg/m2 Gen: NAD, resting comfortably HEENT: Turbinates erythematous with green drainage, TM normal, pharynx mildly erythematous with no tonsilar exudate or edema, minimal maxillary sinus tenderness CV: RRR no murmurs rubs or gallops Lungs: CTAB no crackles, wheeze, rhonchi Abdomen: soft/nontender/nondistended/normal bowel sounds. No rebound or guarding.  Ext: no edema Skin: warm, dry, no rash Neuro: grossly normal, moves all extremities  Assessment/Plan:  Sinsusitis May be Bacterial based on double  sickening (improving then worsened again). We opted to have patient use mucinex for 2 days. If she worsens or is not at least slowly improving, go ahead and take Augmentin antibiotic. If symptoms are not at least 50% better in next 5 days- take antibiotic regardless.   Patient higher risk patient with history of COPD though does not appear to have exacerbation at this time. Her increased cough and change in sputum production does concern me some though and that is an additional  reason why I had lower threshold for antibiotic therapy  Finally, we reviewed reasons to return to care including if symptoms worsen or persist or new concerns arise especially fever or shortness of breath  Meds ordered this encounter  . amoxicillin-clavulanate (AUGMENTIN) 875-125 MG tablet    Sig: Take 1 tablet by mouth 2 (two) times daily.    Dispense:  14 tablet    Refill:  0  Acute new condition  Garret Reddish, MD

## 2016-03-28 ENCOUNTER — Telehealth: Payer: Self-pay | Admitting: Family Medicine

## 2016-03-28 NOTE — Telephone Encounter (Signed)
Pt call to ask for a rx for an asthma emergency. She said she is going a trip.     [place]

## 2016-03-29 MED ORDER — ALBUTEROL SULFATE HFA 108 (90 BASE) MCG/ACT IN AERS: 2.0000 | INHALATION_SPRAY | Freq: Four times a day (QID) | RESPIRATORY_TRACT | Status: DC | PRN

## 2016-03-29 MED ORDER — FLUTICASONE PROPIONATE HFA 110 MCG/ACT IN AERO: 2.0000 | INHALATION_SPRAY | Freq: Two times a day (BID) | RESPIRATORY_TRACT | Status: DC

## 2016-03-29 NOTE — Telephone Encounter (Signed)
Prescription sent for Flovent. Called patient to let her know. Left message for return phone call

## 2016-03-29 NOTE — Addendum Note (Signed)
Addended by: Marin Olp on: 03/29/2016 05:08 PM   Modules accepted: Orders

## 2016-03-29 NOTE — Telephone Encounter (Signed)
Pt is already on [place] and per her conversation with Dr Yong Channel she thought maybe he would call her in ALBUTEROL. Pt is going to [place].

## 2016-03-29 NOTE — Telephone Encounter (Signed)
Refilled albuterol- please inform patient

## 2016-03-30 NOTE — Telephone Encounter (Signed)
Attempted to contact patient but was unable to reach (phone line busy) - will try again later.

## 2016-03-30 NOTE — Telephone Encounter (Signed)
Left a voicemail message notifying patient that her prescription had been sent to the pharmacy.

## 2016-06-28 ENCOUNTER — Telehealth: Payer: Self-pay | Admitting: Cardiovascular Disease

## 2016-06-28 ENCOUNTER — Encounter: Payer: Self-pay | Admitting: Cardiovascular Disease

## 2016-06-28 ENCOUNTER — Ambulatory Visit (INDEPENDENT_AMBULATORY_CARE_PROVIDER_SITE_OTHER): Payer: Medicare Other | Admitting: Cardiovascular Disease

## 2016-06-28 VITALS — BP 122/78 | HR 70 | Ht 64.0 in | Wt 232.2 lb

## 2016-06-28 DIAGNOSIS — Z01810 Encounter for preprocedural cardiovascular examination: Secondary | ICD-10-CM

## 2016-06-28 DIAGNOSIS — E785 Hyperlipidemia, unspecified: Secondary | ICD-10-CM

## 2016-06-28 NOTE — Patient Instructions (Signed)
Medication Instructions:  Your physician recommends that you continue on your current medications as directed. Please refer to the Current Medication list given to you today.   Labwork: none  Testing/Procedures: none  Follow-Up: Your physician recommends that you schedule a follow-up appointment as needed.   Any Other Special Instructions Will Be Listed Below (If Applicable). You have been cleared for your upcoming right TKA w/[facility]. Please stop aspirin one week before surgery.      If you need a refill on your cardiac medications before your next appointment, please call your pharmacy.

## 2016-06-28 NOTE — Progress Notes (Signed)
Cardiology Office Note   Date:  06/28/2016   ID:  [name], DOB [birth year], MRN [id number]  PCP:  Garret Reddish, MD  Cardiologist:   Kathlyn Sacramento, MD   Chief Complaint  Patient presents with  . other    Cardiac clearance for right knee replacement; scheduled for Oct. 24, 2017. Meds reviewed by the patient verbally.       History of Present Illness: [name] is a 70 y.o. female who presents for preoperative cardiovascular evaluation before right knee replacement.  She has no previous cardiac history. She has known history of COPD from second hand smoking, and hyperlipidemia. She does have family history of coronary artery disease but not prematurely. She is a lifelong nonsmoker. She has no history of hypertension or diabetes.  She was seen last year for atypical chest pain. She underwent a pharmacologic nuclear stress test which showed no evidence of ischemia with normal ejection fraction. She reports no recurrent chest pain. No significant dyspnea. Her physical capacity is reduced due to arthritis but overall she is able to perform activities of daily living with no significant limitations.   Past Medical History:  Diagnosis Date  . Allergy   . ASCUS on Pap smear 2007  . ASTHMA UNSPECIFIED WITH EXACERBATION 11/08/2008  . Cancer of skin of left ear   . Chest pain 03/2011   negative stress test - Dr. Ron Parker  . [place] 02/09/2009  . Kidney stones   . OSTEOARTHRITIS 04/24/2007  . Osteopenia   . Osteoporosis   . SHINGLES 11/08/2008   had vaccine at drugstore 12/14  . VARICOSE VEINS LOWER EXTREMITIES W/INFLAMMATION 02/05/2008    Past Surgical History:  Procedure Laterality Date  . Bone spur shoulder     x2 removed  . COLPOSCOPY     Neg HR HPV  . KNEE ARTHROSCOPY    . lens implants- both eyes    . SPINE SURGERY  2008   lumbar     Current Outpatient Prescriptions  Medication Sig Dispense Refill  . albuterol (PROVENTIL HFA;VENTOLIN HFA) 108 (90  Base) MCG/ACT inhaler Inhale 2 puffs into the lungs every 6 (six) hours as needed for wheezing or shortness of breath. 1 Inhaler 5  . aspirin 81 MG EC tablet Take 81 mg by mouth daily.      Marland Kitchen CALCIUM PO Take 1 tablet by mouth daily.     . cetirizine (ZYRTEC ALLERGY) 10 MG tablet Take 10 mg by mouth at bedtime.      . Cholecalciferol (VITAMIN D3) 2000 UNITS TABS Take 2,000 Units by mouth 2 (two) times daily.      . fish oil-omega-3 fatty acids 1000 MG capsule Take 2 capsules (2 g total) by mouth 2 (two) times daily. total for 4,000 mg (Patient taking differently: Take 2 g by mouth daily. total for 4,000 mg)    . fluticasone (FLOVENT HFA) 110 MCG/ACT inhaler Inhale 2 puffs into the lungs 2 (two) times daily. 1 Inhaler 12  . GLUCOSAMINE SULFATE PO Take 2 tablets by mouth daily.     . meloxicam (MOBIC) 7.5 MG tablet TAKE ONE TABLET BY MOUTH TWICE DAILY. 180 tablet 5  . Multiple Vitamin (MULTIVITAMIN) tablet Take 1 tablet by mouth daily.    . Multiple Vitamins-Minerals (ICAPS AREDS FORMULA PO) Take by mouth daily.    . rosuvastatin (CRESTOR) 10 MG tablet TAKE ONE TABLET BY MOUTH ONCE DAILY 30 tablet 3   No current facility-administered medications for this visit.  Allergies:   Atorvastatin and Cefuroxime axetil    Social History:  The patient  reports that she is a non-smoker but has been exposed to tobacco smoke. She has never used smokeless tobacco. She reports that she drinks about 0.6 oz of alcohol per week . She reports that she does not use drugs.   Family History:  The patient's family history includes Breast cancer in her cousin; Cancer in her maternal grandmother; Diabetes in her paternal aunt; Down syndrome in her sister; Heart disease in her father, maternal grandfather, and paternal grandfather; Hypertension in her father; Melanoma in her mother; Ovarian cancer in her mother; Prostate cancer in her maternal uncle, maternal uncle, and maternal uncle; Sudden Cardiac Death in her son.     ROS:  Please see the history of present illness.   Otherwise, review of systems are positive for none.   All other systems are reviewed and negative.    PHYSICAL EXAM: VS:  BP 122/78 (BP Location: Left Arm, Patient Position: Sitting, Cuff Size: Normal)   Pulse 70   Ht 5\' 4"  (1.626 m)   Wt 232 lb 4 oz (105.3 kg)   BMI 39.87 kg/m  , BMI Body mass index is 39.87 kg/m. GEN: Well nourished, well developed, in no acute distress  HEENT: normal  Neck: no JVD, carotid bruits, or masses Cardiac: RRR; no murmurs, rubs, or gallops,no edema  Respiratory:  clear to auscultation bilaterally, normal work of breathing GI: soft, nontender, nondistended, + BS MS: no deformity or atrophy  Skin: warm and dry, no rash Neuro:  Strength and sensation are intact Psych: euthymic mood, full affect   EKG:  EKG is ordered today. The ekg ordered today demonstrates  normal sinus rhythm with low voltage. No significant ST or T wave changes.   Recent Labs: 10/26/2015: ALT 20; BUN 23; Creatinine, Ser 0.69; Hemoglobin 13.5; Platelets 171.0; Potassium 4.0; Sodium 141; TSH 1.53    Lipid Panel    Component Value Date/Time   CHOL 141 10/26/2015 1008   CHOL 221 (H) 06/17/2015 0856   TRIG 74.0 10/26/2015 1008   HDL 59.30 10/26/2015 1008   HDL 67 06/17/2015 0856   CHOLHDL 2 10/26/2015 1008   VLDL 14.8 10/26/2015 1008   LDLCALC 66 10/26/2015 1008   LDLCALC 142 (H) 06/17/2015 0856   LDLDIRECT 149.5 10/24/2012 0831      Wt Readings from Last 3 Encounters:  06/28/16 232 lb 4 oz (105.3 kg)  02/29/16 229 lb (103.9 kg)  12/01/15 224 lb (101.6 kg)       No flowsheet data found.    ASSESSMENT AND PLAN:  1.  Preoperative cardiovascular evaluation: Overall, she has no recurrent chest pain. Nuclear stress test last year was  normal. Her functional capacity is reasonable and baseline ECG does not show any significant ischemic changes. Thus, she is considered at low risk from a cardiac standpoint. Aspirin  can be held 7 days before surgery.  2. Hyperlipidemia: She resumed taking rosuvastatin with significant improvement in lipid profile with most recent LDL of 66.  Disposition:   FU with me as needed  Signed,  Kathlyn Sacramento, MD  06/28/2016 10:18 AM    [place]

## 2016-06-28 NOTE — Telephone Encounter (Signed)
Faxed clearance for right TKA to [facility], Attn: Orson Slick, [phone]

## 2016-07-16 ENCOUNTER — Encounter: Payer: Self-pay | Admitting: Family Medicine

## 2016-07-16 ENCOUNTER — Ambulatory Visit (INDEPENDENT_AMBULATORY_CARE_PROVIDER_SITE_OTHER)
Admission: RE | Admit: 2016-07-16 | Discharge: 2016-07-16 | Disposition: A | Discharge status: Home / Self Care | Payer: Medicare Other | Source: Ambulatory Visit | Attending: Family Medicine | Admitting: Family Medicine

## 2016-07-16 ENCOUNTER — Ambulatory Visit (INDEPENDENT_AMBULATORY_CARE_PROVIDER_SITE_OTHER): Payer: Medicare Other | Admitting: Family Medicine

## 2016-07-16 VITALS — BP 104/68 | HR 74 | Temp 98.0°F | Wt 231.6 lb

## 2016-07-16 DIAGNOSIS — J189 Pneumonia, unspecified organism: Secondary | ICD-10-CM

## 2016-07-16 DIAGNOSIS — R059 Cough, unspecified: Secondary | ICD-10-CM

## 2016-07-16 DIAGNOSIS — J432 Centrilobular emphysema: Secondary | ICD-10-CM

## 2016-07-16 DIAGNOSIS — R05 Cough: Secondary | ICD-10-CM

## 2016-07-16 DIAGNOSIS — E785 Hyperlipidemia, unspecified: Secondary | ICD-10-CM

## 2016-07-16 DIAGNOSIS — Z23 Encounter for immunization: Secondary | ICD-10-CM

## 2016-07-16 MED ORDER — DOXYCYCLINE HYCLATE 100 MG PO TABS: 100.0000 mg | ORAL_TABLET | Freq: Two times a day (BID) | ORAL | 0 refills | Status: DC

## 2016-07-16 NOTE — Assessment & Plan Note (Addendum)
S:well controlled on qvar 2 puffs . Has albuterol but not using. She is not really wheezing but has had a cough for a few weeks- was wondering if this is just allergies A/P: continue current medication- doing well - no contraindication to surgery from COPD perspective. We did get a CXR as has been several years and possible PNA in lingula which we will cover with doxycycline with COPD history and potential pneumonia.

## 2016-07-16 NOTE — Progress Notes (Signed)
Subjective:  [name] is a 70 y.o. year old very pleasant female patient who presents for/with See problem oriented charting ROS- No chest pain or shortness of breath. No headache or blurry vision. Does have a cough that feels like its from her chest but is dry.see any ROS included in HPI as well.   Past Medical History-  Patient Active Problem List   Diagnosis Date Noted  . Hyperlipidemia with target LDL less than 100 02/09/2009    Priority: Medium  . COPD (chronic obstructive pulmonary disease) ([place]) 02/05/2008    Priority: Medium  . Obesity, unspecified 06/17/2014    Priority: Low  . Kidney stones 06/17/2014    Priority: Low  . Osteopenia     Priority: Low  . ALLERGIC RHINITIS 05/07/2008    Priority: Low  . VARICOSE VEINS LOWER EXTREMITIES W/INFLAMMATION 02/05/2008    Priority: Low  . OSTEOARTHRITIS 04/24/2007    Priority: Low  . Dysphagia 11/14/2015  . Atypical chest pain 12/27/2014  . Palpitations 12/27/2014    Medications- reviewed and updated Current Outpatient Prescriptions  Medication Sig Dispense Refill  . albuterol (PROVENTIL HFA;VENTOLIN HFA) 108 (90 Base) MCG/ACT inhaler Inhale 2 puffs into the lungs every 6 (six) hours as needed for wheezing or shortness of breath. 1 Inhaler 5  . aspirin 81 MG EC tablet Take 81 mg by mouth daily.      Marland Kitchen CALCIUM PO Take 1 tablet by mouth daily.     . cetirizine (ZYRTEC ALLERGY) 10 MG tablet Take 10 mg by mouth at bedtime.      . Cholecalciferol (VITAMIN D3) 2000 UNITS TABS Take 2,000 Units by mouth 2 (two) times daily.      . fish oil-omega-3 fatty acids 1000 MG capsule Take 2 capsules (2 g total) by mouth 2 (two) times daily. total for 4,000 mg (Patient taking differently: Take 2 g by mouth daily. total for 4,000 mg)    . fluticasone (FLOVENT HFA) 110 MCG/ACT inhaler Inhale 2 puffs into the lungs 2 (two) times daily. 1 Inhaler 12  . GLUCOSAMINE SULFATE PO Take 2 tablets by mouth daily.     . meloxicam (MOBIC) 7.5 MG tablet  TAKE ONE TABLET BY MOUTH TWICE DAILY. 180 tablet 5  . Multiple Vitamin (MULTIVITAMIN) tablet Take 1 tablet by mouth daily.    . Multiple Vitamins-Minerals (ICAPS AREDS FORMULA PO) Take by mouth daily.    . Olopatadine HCl (PAZEO) 0.7 % SOLN Apply 1 drop to eye daily.    . rosuvastatin (CRESTOR) 10 MG tablet TAKE ONE TABLET BY MOUTH ONCE DAILY 30 tablet 3  . doxycycline (VIBRA-TABS) 100 MG tablet Take 1 tablet (100 mg total) by mouth 2 (two) times daily. 14 tablet 0   No current facility-administered medications for this visit.     Objective: BP 104/68 (BP Location: Left Arm, Patient Position: Sitting, Cuff Size: Large)   Pulse 74   Temp 98 F (36.7 C) (Oral)   Wt 231 lb 9.6 oz (105.1 kg)   SpO2 95%   BMI 39.75 kg/m  Gen: NAD, resting comfortably CV: RRR no murmurs rubs or gallops Lungs: CTAB no crackles, wheeze, rhonchi Abdomen: soft/nontender/nondistended/normal bowel sounds.  Ext: no edema Skin: warm, dry Neuro: grossly normal, moves all extremities  Assessment/Plan:  CAP COPD (chronic obstructive pulmonary disease) ([facility]) S:well controlled on qvar 2 puffs . Has albuterol but not using. She is not really wheezing but has had a cough for a few weeks- was wondering if this is  just allergies A/P: continue current medication- doing well - no contraindication to surgery from COPD perspective. We did get a CXR as has been several years and possible PNA in lingula which we will cover with doxycycline with COPD history and potential pneumonia.   Hyperlipidemia with target LDL less than 100 S: well controlled on crestor mainly 3 days a week. No myalgias.  Lab Results  Component Value Date   CHOL 141 10/26/2015   HDL 59.30 10/26/2015   LDLCALC 66 10/26/2015   LDLDIRECT 149.5 10/24/2012   TRIG 74.0 10/26/2015   CHOLHDL 2 10/26/2015   A/P: LDL at goal <70. Patient has already had clearance from cardiology in regards to CV risk as well. She had a low risk stress test a year ago and  was oked for holding aspirin 7 days before surgery.    Orders Placed This Encounter  Procedures  . DG Chest 2 View    Standing Status:   Future    Number of Occurrences:   1    Standing Expiration Date:   09/15/2017    Order Specific Question:   Reason for Exam (SYMPTOM  OR DIAGNOSIS REQUIRED)    Answer:   cough 5 days, upcoming surgery later this month for knee replacement, COPD history    Order Specific Question:   Preferred imaging location?    Answer:   [facility]  . Flu vaccine HIGH DOSE PF    Meds ordered this encounter  Medications  . Olopatadine HCl (PAZEO) 0.7 % SOLN    Sig: Apply 1 drop to eye daily.  Marland Kitchen doxycycline (VIBRA-TABS) 100 MG tablet    Sig: Take 1 tablet (100 mg total) by mouth 2 (two) times daily.    Dispense:  14 tablet    Refill:  0    Return precautions advised.  Garret Reddish, MD

## 2016-07-16 NOTE — Progress Notes (Signed)
Pre visit review using our clinic review tool, if applicable. No additional management support is needed unless otherwise documented below in the visit note. 

## 2016-07-16 NOTE — Patient Instructions (Addendum)
Cleared for surgery.   Please go to [facility] - located [address] across [address] from [place] - in the basement - Hours: 8:30-5:30 PM M-F. Do not need appointment.   Thanks for doing your flu shot !

## 2016-07-16 NOTE — Assessment & Plan Note (Signed)
S: well controlled on crestor mainly 3 days a week. No myalgias.  Lab Results  Component Value Date   CHOL 141 10/26/2015   HDL 59.30 10/26/2015   LDLCALC 66 10/26/2015   LDLDIRECT 149.5 10/24/2012   TRIG 74.0 10/26/2015   CHOLHDL 2 10/26/2015   A/P: LDL at goal <70. Patient has already had clearance from cardiology in regards to CV risk as well. She had a low risk stress test a year ago and was oked for holding aspirin 7 days before surgery.

## 2016-07-25 NOTE — H&P (Signed)
TOTAL KNEE ADMISSION H&P  Patient is being admitted for right total knee arthroplasty.  Subjective:  Chief Complaint:  Right knee primary OA / pain  HPI: [name], 70 y.o. female, has a history of pain and functional disability in the right knee due to arthritis and has failed non-surgical conservative treatments for greater than 12 weeks to include NSAID's and/or analgesics, corticosteriod injections, viscosupplementation injections and activity modification.  Onset of symptoms was gradual, starting 1+ years ago with gradually worsening course since that time. The patient noted prior procedures on the knee to include  arthroscopy on the right knee(s).  Patient currently rates pain in the right knee(s) at 8 out of 10 with activity. Patient has worsening of pain with activity and weight bearing, pain that interferes with activities of daily living, pain with passive range of motion, crepitus and joint swelling.  Patient has evidence of periarticular osteophytes and joint space narrowing by imaging studies.  There is no active infection.   Risks, benefits and expectations were discussed with the patient.  Risks including but not limited to the risk of anesthesia, blood clots, nerve damage, blood vessel damage, failure of the prosthesis, infection and up to and including death.  Patient understand the risks, benefits and expectations and wishes to proceed with surgery.   PCP: Garret Reddish, MD  D/C Plans:      Home with HHPT  Post-op Meds:       No Rx given   Tranexamic Acid:      To be given - IV  Decadron:      Is to be given  FYI:     ASA  Norco    Patient Active Problem List   Diagnosis Date Noted  . Dysphagia 11/14/2015  . Atypical chest pain 12/27/2014  . Palpitations 12/27/2014  . Obesity, unspecified 06/17/2014  . Kidney stones 06/17/2014  . Osteopenia   . Hyperlipidemia with target LDL less than 100 02/09/2009  . ALLERGIC RHINITIS 05/07/2008  . VARICOSE VEINS LOWER  EXTREMITIES W/INFLAMMATION 02/05/2008  . COPD (chronic obstructive pulmonary disease) ([place]) 02/05/2008  . OSTEOARTHRITIS 04/24/2007   Past Medical History:  Diagnosis Date  . Allergy   . ASCUS on Pap smear 2007  . ASTHMA UNSPECIFIED WITH EXACERBATION 11/08/2008  . Cancer of skin of left ear   . Chest pain 03/2011   negative stress test - Dr. Ron Parker  . [place], [place] 02/09/2009  . Kidney stones   . OSTEOARTHRITIS 04/24/2007  . Osteopenia   . Osteoporosis   . SHINGLES 11/08/2008   had vaccine at drugstore 12/14  . VARICOSE VEINS LOWER EXTREMITIES W/INFLAMMATION 02/05/2008    Past Surgical History:  Procedure Laterality Date  . Bone spur shoulder     x2 removed  . COLPOSCOPY     Neg HR HPV  . KNEE ARTHROSCOPY    . lens implants- both eyes    . SPINE SURGERY  2008   lumbar    No prescriptions prior to admission.   Allergies  Allergen Reactions  . Atorvastatin     Myalgia  . Cefuroxime Axetil Diarrhea and Nausea And Vomiting    Social History  Substance Use Topics  . Smoking status: Passive Smoke Exposure - Never Smoker  . Smokeless tobacco: Never Used     Comment: last exposure 1999  . Alcohol use 0.6 oz/week    1 Standard drinks or equivalent per week     Comment: monthly    Family History  Problem Relation  Age of Onset  . Ovarian cancer Mother   . Melanoma Mother   . Hypertension Father   . Heart disease Father     MI 60  . Heart disease Paternal Grandfather   . Cancer Maternal Grandmother     Oral cancer  . Down syndrome Sister   . Prostate cancer Maternal Uncle   . Heart disease Maternal Grandfather   . Prostate cancer Maternal Uncle   . Prostate cancer Maternal Uncle   . Breast cancer Cousin     two maternal cousins diagnosed <50  . Diabetes Paternal Aunt   . Sudden Cardiac Death Son     unclear cause.   . Colon cancer Neg Hx   . Esophageal cancer Neg Hx   . Rectal cancer Neg Hx   . Stomach cancer Neg Hx      Review of Systems   Constitutional: Positive for malaise/fatigue (occassionally).  HENT: Negative.   Eyes: Negative.   Respiratory: Negative.   Cardiovascular: Negative.   Gastrointestinal: Negative.   Genitourinary: Positive for frequency.  Musculoskeletal: Positive for joint pain.  Skin: Negative.   Neurological: Negative.   Endo/Heme/Allergies: Positive for environmental allergies.  Psychiatric/Behavioral: Negative.     Objective:  Physical Exam  Constitutional: She is oriented to person, place, and time. She appears well-developed.  HENT:  Head: Normocephalic.  Eyes: Pupils are equal, round, and reactive to light.  Neck: Neck supple. No JVD present. No tracheal deviation present. No thyromegaly present.  Cardiovascular: Normal rate, regular rhythm, normal heart sounds and intact distal pulses.   Respiratory: Effort normal and breath sounds normal. No respiratory distress. She has no wheezes.  GI: Soft. There is no tenderness. There is no guarding.  Musculoskeletal:       Right knee: She exhibits decreased range of motion, swelling and bony tenderness. She exhibits no ecchymosis, no deformity, no laceration and no erythema. Tenderness found.  Lymphadenopathy:    She has no cervical adenopathy.  Neurological: She is alert and oriented to person, place, and time.  Skin: Skin is warm and dry.  Psychiatric: She has a normal mood and affect.     Labs:  Estimated body mass index is 39.75 kg/m as calculated from the following:   Height as of 06/28/16: 5\' 4"  (1.626 m).   Weight as of 07/16/16: 105.1 kg (231 lb 9.6 oz).   Imaging Review Plain radiographs demonstrate severe degenerative joint disease of the right knee(s). The overall alignment isneutral. The bone quality appears to be good for age and reported activity level.  Assessment/Plan:  End stage arthritis, right knee   The patient history, physical examination, clinical judgment of the provider and imaging studies are consistent with  end stage degenerative joint disease of the right knee(s) and total knee arthroplasty is deemed medically necessary. The treatment options including medical management, injection therapy arthroscopy and arthroplasty were discussed at length. The risks and benefits of total knee arthroplasty were presented and reviewed. The risks due to aseptic loosening, infection, stiffness, patella tracking problems, thromboembolic complications and other imponderables were discussed. The patient acknowledged the explanation, agreed to proceed with the plan and consent was signed. Patient is being admitted for inpatient treatment for surgery, pain control, PT, OT, prophylactic antibiotics, VTE prophylaxis, progressive ambulation and ADL's and discharge planning. The patient is planning to be discharged home with home health services.      [place] Taniya Dasher   PA-C  07/25/2016, 11:54 AM

## 2016-07-26 ENCOUNTER — Other Ambulatory Visit (HOSPITAL_COMMUNITY): Payer: Medicare Other

## 2016-07-31 ENCOUNTER — Encounter (HOSPITAL_COMMUNITY)
Admission: RE | Admit: 2016-07-31 | Discharge: 2016-07-31 | Disposition: A | Discharge status: Home / Self Care | Payer: Medicare Other | Source: Ambulatory Visit | Attending: Orthopedic Surgery | Admitting: Orthopedic Surgery

## 2016-07-31 ENCOUNTER — Ambulatory Visit (HOSPITAL_COMMUNITY)
Admission: RE | Admit: 2016-07-31 | Discharge: 2016-07-31 | Disposition: A | Discharge status: Home / Self Care | Payer: Medicare Other | Source: Ambulatory Visit | Attending: Anesthesiology | Admitting: Anesthesiology

## 2016-07-31 ENCOUNTER — Encounter (HOSPITAL_COMMUNITY): Payer: Self-pay

## 2016-07-31 DIAGNOSIS — J984 Other disorders of lung: Secondary | ICD-10-CM | POA: Diagnosis not present

## 2016-07-31 DIAGNOSIS — R9389 Abnormal findings on diagnostic imaging of other specified body structures: Secondary | ICD-10-CM

## 2016-07-31 DIAGNOSIS — Z01818 Encounter for other preprocedural examination: Secondary | ICD-10-CM | POA: Diagnosis present

## 2016-07-31 HISTORY — DX: Personal history of urinary (tract) infections: Z87.440

## 2016-07-31 HISTORY — DX: Other specified postprocedural states: Z98.890

## 2016-07-31 HISTORY — DX: Pneumonia, unspecified organism: J18.9

## 2016-07-31 HISTORY — DX: Personal history of urinary calculi: Z87.442

## 2016-07-31 HISTORY — DX: Other specified postprocedural states: R11.2

## 2016-07-31 LAB — CBC
HCT: 41.3 % (ref 36.0–46.0)
Hemoglobin: 13.8 g/dL (ref 12.0–15.0)
MCH: 31.4 pg (ref 26.0–34.0)
MCHC: 33.4 g/dL (ref 30.0–36.0)
MCV: 93.9 fL (ref 78.0–100.0)
PLATELETS: 180 10*3/uL (ref 150–400)
RBC: 4.4 MIL/uL (ref 3.87–5.11)
RDW: 13 % (ref 11.5–15.5)
WBC: 5.8 10*3/uL (ref 4.0–10.5)

## 2016-07-31 LAB — BASIC METABOLIC PANEL
Anion gap: 7 (ref 5–15)
BUN: 32 mg/dL — AB (ref 6–20)
CALCIUM: 9.9 mg/dL (ref 8.9–10.3)
CO2: 27 mmol/L (ref 22–32)
Chloride: 104 mmol/L (ref 101–111)
Creatinine, Ser: 0.75 mg/dL (ref 0.44–1.00)
GFR calc Af Amer: >60 mL/min (ref >60)
GLUCOSE: 97 mg/dL (ref 65–99)
POTASSIUM: 4.9 mmol/L (ref 3.5–5.1)
Sodium: 138 mmol/L (ref 135–145)

## 2016-07-31 LAB — SURGICAL PCR SCREEN
MRSA, PCR: NEGATIVE
Staphylococcus aureus: POSITIVE — AB

## 2016-07-31 LAB — ABO/RH: ABO/RH(D): O NEG

## 2016-07-31 NOTE — Progress Notes (Signed)
BMP results in epic per PAT visit 07/31/2016 sent to Dr Alvan Dame

## 2016-07-31 NOTE — Progress Notes (Signed)
Clearance note per chart Dr Yong Channel 07/16/2016  Clearance note per chart Dr Fletcher Anon

## 2016-07-31 NOTE — Patient Instructions (Signed)
[name]  07/31/2016   Your procedure is scheduled on: Tuesday August 07, 2016  Report to [facility] Main  Entrance take [place]  elevators to 3rd floor to  [place] at 7:00 AM.  Call this number if you have problems the morning of surgery [phone]   Remember: ONLY 1 PERSON MAY GO WITH YOU TO SHORT STAY TO GET  READY MORNING OF [place].  Do not eat food or drink liquids :After Midnight.     Take these medicines the morning of surgery: May use albuterol inhaler (rescue inhaler) if needed; Flovent Inhaler; Eye Drops if needed                                 You may not have any metal on your body including hair pins and              piercings  Do not wear jewelry, make-up, lotions, powders or perfumes, deodorant             Do not wear nail polish.  Do not shave  48 hours prior to surgery.         Do not bring valuables to the hospital. [place].  Contacts, dentures or bridgework may not be worn into surgery.  Leave suitcase in the car. After surgery it may be brought to your room.                Please read over the following fact sheets you were given:MRSA INFORMATION SHEET; INCENTIVE SPIROMETER; BLOOD TRANSFUSION INFORMATION SHEET  _____________________________________________________________________             [facility] - Preparing for Surgery Before surgery, you can play an important role.  Because skin is not sterile, your skin needs to be as free of germs as possible.  You can reduce the number of germs on your skin by washing with CHG (chlorahexidine gluconate) soap before surgery.  CHG is an antiseptic cleaner which kills germs and bonds with the skin to continue killing germs even after washing. Please DO NOT use if you have an allergy to CHG or antibacterial soaps.  If your skin becomes reddened/irritated stop using the CHG and inform your nurse when you arrive at Short  Stay. Do not shave (including legs and underarms) for at least 48 hours prior to the first CHG shower.  You may shave your face/neck. Please follow these instructions carefully:  1.  Shower with CHG Soap the night before surgery and the  morning of Surgery.  2.  If you choose to wash your hair, wash your hair first as usual with your  normal  shampoo.  3.  After you shampoo, rinse your hair and body thoroughly to remove the  shampoo.                           4.  Use CHG as you would any other liquid soap.  You can apply chg directly  to the skin and wash                       Gently with a scrungie or clean washcloth.  5.  Apply the CHG Soap to your body ONLY FROM THE NECK DOWN.   Do not use on face/ open                           Wound or open sores. Avoid contact with eyes, ears mouth and genitals (private parts).                       Wash face,  Genitals (private parts) with your normal soap.             6.  Wash thoroughly, paying special attention to the area where your surgery  will be performed.  7.  Thoroughly rinse your body with warm water from the neck down.  8.  DO NOT shower/wash with your normal soap after using and rinsing off  the CHG Soap.                9.  Pat yourself dry with a clean towel.            10.  Wear clean pajamas.            11.  Place clean sheets on your bed the night of your first shower and do not  sleep with pets. Day of Surgery : Do not apply any lotions/deodorants the morning of surgery.  Please wear clean clothes to the hospital/surgery center.  FAILURE TO FOLLOW THESE INSTRUCTIONS MAY RESULT IN THE CANCELLATION OF YOUR SURGERY PATIENT SIGNATURE_________________________________  NURSE SIGNATURE__________________________________  ________________________________________________________________________   Adam Phenix  An incentive spirometer is a tool that can help keep your lungs clear and active. This tool measures how well you are  filling your lungs with each breath. Taking long deep breaths may help reverse or decrease the chance of developing breathing (pulmonary) problems (especially infection) following:  A long period of time when you are unable to move or be active. BEFORE THE PROCEDURE   If the spirometer includes an indicator to show your best effort, your nurse or respiratory therapist will set it to a desired goal.  If possible, sit up straight or lean slightly forward. Try not to slouch.  Hold the incentive spirometer in an upright position. INSTRUCTIONS FOR USE  1. Sit on the edge of your bed if possible, or sit up as far as you can in bed or on a chair. 2. Hold the incentive spirometer in an upright position. 3. Breathe out normally. 4. Place the mouthpiece in your mouth and seal your lips tightly around it. 5. Breathe in slowly and as deeply as possible, raising the piston or the ball toward the top of the column. 6. Hold your breath for 3-5 seconds or for as long as possible. Allow the piston or ball to fall to the bottom of the column. 7. Remove the mouthpiece from your mouth and breathe out normally. 8. Rest for a few seconds and repeat Steps 1 through 7 at least 10 times every 1-2 hours when you are awake. Take your time and take a few normal breaths between deep breaths. 9. The spirometer may include an indicator to show your best effort. Use the indicator as a goal to work toward during each repetition. 10. After each set of 10 deep breaths, practice coughing to be sure your lungs are clear. If you have an incision (the cut made at the time of surgery), support your incision when coughing by placing a  pillow or rolled up towels firmly against it. Once you are able to get out of bed, walk around indoors and cough well. You may stop using the incentive spirometer when instructed by your caregiver.  RISKS AND COMPLICATIONS  Take your time so you do not get dizzy or light-headed.  If you are in pain,  you may need to take or ask for pain medication before doing incentive spirometry. It is harder to take a deep breath if you are having pain. AFTER USE  Rest and breathe slowly and easily.  It can be helpful to keep track of a log of your progress. Your caregiver can provide you with a simple table to help with this. If you are using the spirometer at home, follow these instructions: [place] IF:   You are having difficultly using the spirometer.  You have trouble using the spirometer as often as instructed.  Your pain medication is not giving enough relief while using the spirometer.  You develop fever of 100.5 F (38.1 C) or higher. SEEK IMMEDIATE MEDICAL CARE IF:   You cough up bloody sputum that had not been present before.  You develop fever of 102 F (38.9 C) or greater.  You develop worsening pain at or near the incision site. MAKE SURE YOU:   Understand these instructions.  Will watch your condition.  Will get help right away if you are not doing well or get worse. Document Released: 02/11/2007 Document Revised: 12/24/2011 Document Reviewed: 04/14/2007 ExitCare Patient Information 2014 Exit[employer], [place].   ________________________________________________________________________  WHAT IS A BLOOD TRANSFUSION? Blood Transfusion Information  A transfusion is the replacement of blood or some of its parts. Blood is made up of multiple cells which provide different functions.  Red blood cells carry oxygen and are used for blood loss replacement.  White blood cells fight against infection.  Platelets control bleeding.  Plasma helps clot blood.  Other blood products are available for specialized needs, such as hemophilia or other clotting disorders. BEFORE THE TRANSFUSION  Who gives blood for transfusions?   Healthy volunteers who are fully evaluated to make sure their blood is safe. This is blood bank blood. Transfusion therapy is the safest it has ever  been in the practice of medicine. Before blood is taken from a donor, a complete history is taken to make sure that person has no history of diseases nor engages in risky social behavior (examples are intravenous drug use or sexual activity with multiple partners). The donor's travel history is screened to minimize risk of transmitting infections, such as malaria. The donated blood is tested for signs of infectious diseases, such as HIV and hepatitis. The blood is then tested to be sure it is compatible with you in order to minimize the chance of a transfusion reaction. If you or a relative donates blood, this is often done in anticipation of surgery and is not appropriate for emergency situations. It takes many days to process the donated blood. RISKS AND COMPLICATIONS Although transfusion therapy is very safe and saves many lives, the main dangers of transfusion include:   Getting an infectious disease.  Developing a transfusion reaction. This is an allergic reaction to something in the blood you were given. Every precaution is taken to prevent this. The decision to have a blood transfusion has been considered carefully by your caregiver before blood is given. Blood is not given unless the benefits outweigh the risks. AFTER THE TRANSFUSION  Right after receiving a blood transfusion, you  will usually feel much better and more energetic. This is especially true if your red blood cells have gotten low (anemic). The transfusion raises the level of the red blood cells which carry oxygen, and this usually causes an energy increase.  The nurse administering the transfusion will monitor you carefully for complications. HOME CARE INSTRUCTIONS  No special instructions are needed after a transfusion. You may find your energy is better. Speak with your caregiver about any limitations on activity for underlying diseases you may have. SEEK MEDICAL CARE IF:   Your condition is not improving after your  transfusion.  You develop redness or irritation at the intravenous (IV) site. SEEK IMMEDIATE MEDICAL CARE IF:  Any of the following symptoms occur over the next 12 hours:  Shaking chills.  You have a temperature by mouth above 102 F (38.9 C), not controlled by medicine.  Chest, back, or muscle pain.  People around you feel you are not acting correctly or are confused.  Shortness of breath or difficulty breathing.  Dizziness and fainting.  You get a rash or develop hives.  You have a decrease in urine output.  Your urine turns a dark color or changes to pink, red, or brown. Any of the following symptoms occur over the next 10 days:  You have a temperature by mouth above 102 F (38.9 C), not controlled by medicine.  Shortness of breath.  Weakness after normal activity.  The white part of the eye turns yellow (jaundice).  You have a decrease in the amount of urine or are urinating less often.  Your urine turns a dark color or changes to pink, red, or brown. Document Released: 09/28/2000 Document Revised: 12/24/2011 Document Reviewed: 05/17/2008 [facility] Patient Information 2014 [place], [place].  _______________________________________________________________________

## 2016-07-31 NOTE — Progress Notes (Addendum)
PCR screening results in epic per PAT visit 07/31/2016 positive for Staph. Results sent to Dr Alvan Dame. Prescription for Mupriocin Ointment called to [facility]; spoke with Wendy/pharmacist. Pt is aware.

## 2016-08-06 MED ORDER — VANCOMYCIN HCL 10 G IV SOLR
1500.0000 mg | INTRAVENOUS | Status: AC
  Administered 2016-08-07: 1500 mg via INTRAVENOUS
  Filled 2016-08-06: qty 1500

## 2016-08-06 NOTE — Progress Notes (Signed)
Instructed Ms. Swisshelm niece Alden Benjamin, also driver for surgery) to arrive to Short Stay at 0830. She verbalized understanding.

## 2016-08-07 ENCOUNTER — Encounter (HOSPITAL_COMMUNITY): Payer: Self-pay | Admitting: Registered Nurse

## 2016-08-07 ENCOUNTER — Inpatient Hospital Stay (HOSPITAL_COMMUNITY)
Admission: RE | Admit: 2016-08-07 | Discharge: 2016-08-08 | DRG: 470 | Disposition: A | Discharge status: Home Health | Payer: Medicare Other | Source: Ambulatory Visit | Attending: Orthopedic Surgery | Admitting: Orthopedic Surgery

## 2016-08-07 ENCOUNTER — Inpatient Hospital Stay (HOSPITAL_COMMUNITY): Payer: Medicare Other | Admitting: Registered Nurse

## 2016-08-07 ENCOUNTER — Encounter (HOSPITAL_COMMUNITY)
Admission: RE | Disposition: A | Discharge status: Home Health | Payer: Self-pay | Source: Ambulatory Visit | Attending: Orthopedic Surgery

## 2016-08-07 DIAGNOSIS — Z8249 Family history of ischemic heart disease and other diseases of the circulatory system: Secondary | ICD-10-CM

## 2016-08-07 DIAGNOSIS — E785 Hyperlipidemia, unspecified: Secondary | ICD-10-CM | POA: Diagnosis present

## 2016-08-07 DIAGNOSIS — Z803 Family history of malignant neoplasm of breast: Secondary | ICD-10-CM

## 2016-08-07 DIAGNOSIS — M25561 Pain in right knee: Secondary | ICD-10-CM | POA: Diagnosis present

## 2016-08-07 DIAGNOSIS — Z7722 Contact with and (suspected) exposure to environmental tobacco smoke (acute) (chronic): Secondary | ICD-10-CM | POA: Diagnosis not present

## 2016-08-07 DIAGNOSIS — E669 Obesity, unspecified: Secondary | ICD-10-CM | POA: Diagnosis present

## 2016-08-07 DIAGNOSIS — Z833 Family history of diabetes mellitus: Secondary | ICD-10-CM

## 2016-08-07 DIAGNOSIS — M1711 Unilateral primary osteoarthritis, right knee: Secondary | ICD-10-CM | POA: Diagnosis present

## 2016-08-07 DIAGNOSIS — M8580 Other specified disorders of bone density and structure, unspecified site: Secondary | ICD-10-CM | POA: Diagnosis present

## 2016-08-07 DIAGNOSIS — Z6838 Body mass index (BMI) 38.0-38.9, adult: Secondary | ICD-10-CM | POA: Diagnosis not present

## 2016-08-07 DIAGNOSIS — Z8041 Family history of malignant neoplasm of ovary: Secondary | ICD-10-CM | POA: Diagnosis not present

## 2016-08-07 DIAGNOSIS — Z96659 Presence of unspecified artificial knee joint: Secondary | ICD-10-CM

## 2016-08-07 DIAGNOSIS — J449 Chronic obstructive pulmonary disease, unspecified: Secondary | ICD-10-CM | POA: Diagnosis present

## 2016-08-07 DIAGNOSIS — Z808 Family history of malignant neoplasm of other organs or systems: Secondary | ICD-10-CM

## 2016-08-07 DIAGNOSIS — Z96651 Presence of right artificial knee joint: Secondary | ICD-10-CM

## 2016-08-07 HISTORY — PX: TOTAL KNEE ARTHROPLASTY: SHX125

## 2016-08-07 LAB — TYPE AND SCREEN
ABO/RH(D): O NEG
ANTIBODY SCREEN: NEGATIVE

## 2016-08-07 SURGERY — ARTHROPLASTY, KNEE, TOTAL
Anesthesia: Spinal | Site: Knee | Laterality: Right

## 2016-08-07 MED ORDER — METOCLOPRAMIDE HCL 5 MG/ML IJ SOLN: 5.0000 mg | Freq: Three times a day (TID) | INTRAMUSCULAR | Status: DC | PRN

## 2016-08-07 MED ORDER — BUDESONIDE 0.25 MG/2ML IN SUSP
0.2500 mg | Freq: Two times a day (BID) | RESPIRATORY_TRACT | Status: DC
  Administered 2016-08-07 – 2016-08-08 (×2): 0.25 mg via RESPIRATORY_TRACT
  Filled 2016-08-07 (×2): qty 2

## 2016-08-07 MED ORDER — SODIUM CHLORIDE 0.9 % IV SOLN: INTRAVENOUS | Status: DC

## 2016-08-07 MED ORDER — FENTANYL CITRATE (PF) 100 MCG/2ML IJ SOLN
INTRAMUSCULAR | Status: DC | PRN
  Administered 2016-08-07: 50 ug via INTRAVENOUS

## 2016-08-07 MED ORDER — MIDAZOLAM HCL 2 MG/2ML IJ SOLN
INTRAMUSCULAR | Status: AC
Start: 2016-08-07 — End: 2016-08-07
  Filled 2016-08-07: qty 2

## 2016-08-07 MED ORDER — ONDANSETRON HCL 4 MG/2ML IJ SOLN: 4.0000 mg | Freq: Four times a day (QID) | INTRAMUSCULAR | Status: DC | PRN

## 2016-08-07 MED ORDER — PROPOFOL 500 MG/50ML IV EMUL
INTRAVENOUS | Status: DC | PRN
  Administered 2016-08-07: 50 ug/kg/min via INTRAVENOUS

## 2016-08-07 MED ORDER — DEXAMETHASONE SODIUM PHOSPHATE 10 MG/ML IJ SOLN
INTRAMUSCULAR | Status: AC
  Filled 2016-08-07: qty 1

## 2016-08-07 MED ORDER — CEFAZOLIN SODIUM-DEXTROSE 2-3 GM-% IV SOLR
INTRAVENOUS | Status: DC | PRN
  Administered 2016-08-07: 2 g via INTRAVENOUS

## 2016-08-07 MED ORDER — CEFAZOLIN SODIUM-DEXTROSE 2-4 GM/100ML-% IV SOLN
INTRAVENOUS | Status: AC
  Filled 2016-08-07: qty 100

## 2016-08-07 MED ORDER — MAGNESIUM CITRATE PO SOLN
1.0000 | Freq: Once | ORAL | Status: DC | PRN
Start: 2016-08-07 — End: 2016-08-08

## 2016-08-07 MED ORDER — LIDOCAINE 2% (20 MG/ML) 5 ML SYRINGE
INTRAMUSCULAR | Status: AC
  Filled 2016-08-07: qty 5

## 2016-08-07 MED ORDER — SODIUM CHLORIDE 0.9 % IJ SOLN
INTRAMUSCULAR | Status: AC
  Filled 2016-08-07: qty 50

## 2016-08-07 MED ORDER — CELECOXIB 200 MG PO CAPS
200.0000 mg | ORAL_CAPSULE | Freq: Two times a day (BID) | ORAL | Status: DC
  Administered 2016-08-07 – 2016-08-08 (×2): 200 mg via ORAL
  Filled 2016-08-07 (×2): qty 1

## 2016-08-07 MED ORDER — MIDAZOLAM HCL 5 MG/ML IJ SOLN
2.0000 mg | Freq: Once | INTRAMUSCULAR | Status: AC
  Administered 2016-08-07: 2 mg via INTRAVENOUS

## 2016-08-07 MED ORDER — METHOCARBAMOL 1000 MG/10ML IJ SOLN
500.0000 mg | Freq: Four times a day (QID) | INTRAVENOUS | Status: DC | PRN
  Filled 2016-08-07: qty 5

## 2016-08-07 MED ORDER — PROPOFOL 10 MG/ML IV BOLUS
INTRAVENOUS | Status: AC
  Filled 2016-08-07: qty 40

## 2016-08-07 MED ORDER — STERILE WATER FOR IRRIGATION IR SOLN
Status: DC | PRN
  Administered 2016-08-07: 3000 mL

## 2016-08-07 MED ORDER — PHENOL 1.4 % MT LIQD: 1.0000 | OROMUCOSAL | Status: DC | PRN

## 2016-08-07 MED ORDER — ROPIVACAINE HCL 7.5 MG/ML IJ SOLN
INTRAMUSCULAR | Status: AC
  Filled 2016-08-07: qty 20

## 2016-08-07 MED ORDER — EPHEDRINE 5 MG/ML INJ
INTRAVENOUS | Status: AC
  Filled 2016-08-07: qty 10

## 2016-08-07 MED ORDER — SODIUM CHLORIDE 0.9 % IR SOLN
Status: DC | PRN
  Administered 2016-08-07: 1000 mL

## 2016-08-07 MED ORDER — DOCUSATE SODIUM 100 MG PO CAPS
100.0000 mg | ORAL_CAPSULE | Freq: Two times a day (BID) | ORAL | Status: DC
  Administered 2016-08-07 – 2016-08-08 (×2): 100 mg via ORAL
  Filled 2016-08-07 (×2): qty 1

## 2016-08-07 MED ORDER — ALBUTEROL SULFATE (2.5 MG/3ML) 0.083% IN NEBU: 3.0000 mL | INHALATION_SOLUTION | Freq: Four times a day (QID) | RESPIRATORY_TRACT | Status: DC | PRN

## 2016-08-07 MED ORDER — PROPOFOL 10 MG/ML IV BOLUS
INTRAVENOUS | Status: AC
Start: 2016-08-07 — End: 2016-08-07
  Filled 2016-08-07: qty 20

## 2016-08-07 MED ORDER — METOCLOPRAMIDE HCL 5 MG PO TABS: 5.0000 mg | ORAL_TABLET | Freq: Three times a day (TID) | ORAL | Status: DC | PRN

## 2016-08-07 MED ORDER — POLYETHYLENE GLYCOL 3350 17 G PO PACK
17.0000 g | PACK | Freq: Two times a day (BID) | ORAL | Status: DC
  Administered 2016-08-07 – 2016-08-08 (×2): 17 g via ORAL
  Filled 2016-08-07 (×2): qty 1

## 2016-08-07 MED ORDER — KETOROLAC TROMETHAMINE 30 MG/ML IJ SOLN
INTRAMUSCULAR | Status: AC
  Filled 2016-08-07: qty 1

## 2016-08-07 MED ORDER — BUPIVACAINE HCL (PF) 0.25 % IJ SOLN
INTRAMUSCULAR | Status: AC
Start: 2016-08-07 — End: 2016-08-07
  Filled 2016-08-07: qty 30

## 2016-08-07 MED ORDER — MENTHOL 3 MG MT LOZG: 1.0000 | LOZENGE | OROMUCOSAL | Status: DC | PRN

## 2016-08-07 MED ORDER — ALUM & MAG HYDROXIDE-SIMETH 200-200-20 MG/5ML PO SUSP
30.0000 mL | ORAL | Status: DC | PRN
Start: 2016-08-07 — End: 2016-08-08

## 2016-08-07 MED ORDER — FENTANYL CITRATE (PF) 100 MCG/2ML IJ SOLN
INTRAMUSCULAR | Status: AC
  Filled 2016-08-07: qty 2

## 2016-08-07 MED ORDER — DEXAMETHASONE SODIUM PHOSPHATE 10 MG/ML IJ SOLN
10.0000 mg | Freq: Once | INTRAMUSCULAR | Status: AC
  Administered 2016-08-08: 10 mg via INTRAVENOUS
  Filled 2016-08-07: qty 1

## 2016-08-07 MED ORDER — MIDAZOLAM HCL 2 MG/2ML IJ SOLN
INTRAMUSCULAR | Status: AC
  Filled 2016-08-07: qty 2

## 2016-08-07 MED ORDER — LORATADINE 10 MG PO TABS
10.0000 mg | ORAL_TABLET | Freq: Every day | ORAL | Status: DC
  Administered 2016-08-07: 10 mg via ORAL
  Filled 2016-08-07: qty 1

## 2016-08-07 MED ORDER — SODIUM CHLORIDE 0.9 % IV SOLN
1500.0000 mg | Freq: Two times a day (BID) | INTRAVENOUS | Status: AC
  Administered 2016-08-07: 1500 mg via INTRAVENOUS
  Filled 2016-08-07: qty 1500

## 2016-08-07 MED ORDER — DIPHENHYDRAMINE HCL 25 MG PO CAPS: 25.0000 mg | ORAL_CAPSULE | Freq: Four times a day (QID) | ORAL | Status: DC | PRN

## 2016-08-07 MED ORDER — HYDROCODONE-ACETAMINOPHEN 7.5-325 MG PO TABS
1.0000 | ORAL_TABLET | ORAL | Status: DC
  Administered 2016-08-07: 2 via ORAL
  Administered 2016-08-07 (×2): 1 via ORAL
  Administered 2016-08-08 (×2): 2 via ORAL
  Filled 2016-08-07: qty 2
  Filled 2016-08-07 (×2): qty 1
  Filled 2016-08-07 (×2): qty 2

## 2016-08-07 MED ORDER — FERROUS SULFATE 325 (65 FE) MG PO TABS
325.0000 mg | ORAL_TABLET | Freq: Three times a day (TID) | ORAL | Status: DC
  Administered 2016-08-07 – 2016-08-08 (×2): 325 mg via ORAL
  Filled 2016-08-07 (×2): qty 1

## 2016-08-07 MED ORDER — ONDANSETRON HCL 4 MG PO TABS: 4.0000 mg | ORAL_TABLET | Freq: Four times a day (QID) | ORAL | Status: DC | PRN

## 2016-08-07 MED ORDER — ROSUVASTATIN CALCIUM 5 MG PO TABS
10.0000 mg | ORAL_TABLET | Freq: Every day | ORAL | Status: DC
  Administered 2016-08-07 – 2016-08-08 (×2): 10 mg via ORAL
  Filled 2016-08-07 (×2): qty 2

## 2016-08-07 MED ORDER — HYDROMORPHONE HCL 1 MG/ML IJ SOLN
0.5000 mg | INTRAMUSCULAR | Status: DC | PRN
  Administered 2016-08-07: 1 mg via INTRAVENOUS
  Administered 2016-08-07: 0.5 mg via INTRAVENOUS
  Filled 2016-08-07 (×2): qty 1

## 2016-08-07 MED ORDER — MIDAZOLAM HCL 5 MG/5ML IJ SOLN
INTRAMUSCULAR | Status: DC | PRN
Start: 2016-08-07 — End: 2016-08-07
  Administered 2016-08-07: 1 mg via INTRAVENOUS

## 2016-08-07 MED ORDER — CHLORHEXIDINE GLUCONATE 4 % EX LIQD: 60.0000 mL | Freq: Once | CUTANEOUS | Status: DC

## 2016-08-07 MED ORDER — TRANEXAMIC ACID 1000 MG/10ML IV SOLN
1000.0000 mg | INTRAVENOUS | Status: AC
  Administered 2016-08-07: 1000 mg via INTRAVENOUS
  Filled 2016-08-07: qty 1100

## 2016-08-07 MED ORDER — FENTANYL CITRATE (PF) 100 MCG/2ML IJ SOLN
25.0000 ug | INTRAMUSCULAR | Status: DC | PRN
  Administered 2016-08-07 (×2): 50 ug via INTRAVENOUS

## 2016-08-07 MED ORDER — 0.9 % SODIUM CHLORIDE (POUR BTL) OPTIME
TOPICAL | Status: DC | PRN
  Administered 2016-08-07: 1000 mL

## 2016-08-07 MED ORDER — BISACODYL 10 MG RE SUPP: 10.0000 mg | Freq: Every day | RECTAL | Status: DC | PRN

## 2016-08-07 MED ORDER — ONDANSETRON HCL 4 MG/2ML IJ SOLN
INTRAMUSCULAR | Status: AC
  Filled 2016-08-07: qty 2

## 2016-08-07 MED ORDER — KETOROLAC TROMETHAMINE 30 MG/ML IJ SOLN
INTRAMUSCULAR | Status: DC | PRN
  Administered 2016-08-07: 30 mg

## 2016-08-07 MED ORDER — SODIUM CHLORIDE 0.9 % IV SOLN
INTRAVENOUS | Status: DC
  Administered 2016-08-07: 15:00:00 via INTRAVENOUS

## 2016-08-07 MED ORDER — PROPOFOL 10 MG/ML IV BOLUS
INTRAVENOUS | Status: AC
  Filled 2016-08-07: qty 20

## 2016-08-07 MED ORDER — EPHEDRINE SULFATE-NACL 50-0.9 MG/10ML-% IV SOSY
PREFILLED_SYRINGE | INTRAVENOUS | Status: DC | PRN
  Administered 2016-08-07 (×2): 5 mg via INTRAVENOUS

## 2016-08-07 MED ORDER — LACTATED RINGERS IV SOLN
INTRAVENOUS | Status: DC
  Administered 2016-08-07: 10:00:00 via INTRAVENOUS

## 2016-08-07 MED ORDER — SODIUM CHLORIDE 0.9 % IJ SOLN
INTRAMUSCULAR | Status: DC | PRN
  Administered 2016-08-07: 30 mL

## 2016-08-07 MED ORDER — ASPIRIN 81 MG PO CHEW
81.0000 mg | CHEWABLE_TABLET | Freq: Two times a day (BID) | ORAL | Status: DC
  Administered 2016-08-07 – 2016-08-08 (×2): 81 mg via ORAL
  Filled 2016-08-07 (×2): qty 1

## 2016-08-07 MED ORDER — LIDOCAINE 2% (20 MG/ML) 5 ML SYRINGE
INTRAMUSCULAR | Status: DC | PRN
  Administered 2016-08-07: 100 mg via INTRAVENOUS

## 2016-08-07 MED ORDER — METHOCARBAMOL 500 MG PO TABS
500.0000 mg | ORAL_TABLET | Freq: Four times a day (QID) | ORAL | Status: DC | PRN
  Administered 2016-08-07: 500 mg via ORAL
  Filled 2016-08-07: qty 1

## 2016-08-07 MED ORDER — ONDANSETRON HCL 4 MG/2ML IJ SOLN
INTRAMUSCULAR | Status: DC | PRN
  Administered 2016-08-07: 4 mg via INTRAVENOUS

## 2016-08-07 MED ORDER — BUPIVACAINE HCL (PF) 0.25 % IJ SOLN
INTRAMUSCULAR | Status: DC | PRN
  Administered 2016-08-07: 30 mL

## 2016-08-07 MED ORDER — DEXAMETHASONE SODIUM PHOSPHATE 10 MG/ML IJ SOLN
10.0000 mg | Freq: Once | INTRAMUSCULAR | Status: AC
  Administered 2016-08-07: 10 mg via INTRAVENOUS

## 2016-08-07 SURGICAL SUPPLY — 46 items
ADH SKN CLS APL DERMABOND .7 (GAUZE/BANDAGES/DRESSINGS) ×1
BAG DECANTER FOR FLEXI CONT (MISCELLANEOUS) IMPLANT
BAG SPEC THK2 15X12 ZIP CLS (MISCELLANEOUS)
BAG ZIPLOCK 12X15 (MISCELLANEOUS) IMPLANT
BANDAGE ACE 6X5 VEL STRL LF (GAUZE/BANDAGES/DRESSINGS) ×3 IMPLANT
BLADE SAW SGTL 13.0X1.19X90.0M (BLADE) ×3 IMPLANT
BOWL SMART MIX CTS (DISPOSABLE) ×3 IMPLANT
CAPT KNEE TOTAL 3 ATTUNE ×2 IMPLANT
CEMENT HV SMART SET (Cement) ×4 IMPLANT
CLOTH BEACON ORANGE TIMEOUT ST (SAFETY) ×3 IMPLANT
CUFF TOURN SGL QUICK 34 (TOURNIQUET CUFF) ×3
CUFF TRNQT CYL 34X4X40X1 (TOURNIQUET CUFF) ×1 IMPLANT
DECANTER SPIKE VIAL GLASS SM (MISCELLANEOUS) ×3 IMPLANT
DERMABOND ADVANCED (GAUZE/BANDAGES/DRESSINGS) ×2
DERMABOND ADVANCED .7 DNX12 (GAUZE/BANDAGES/DRESSINGS) ×1 IMPLANT
DRAPE U-SHAPE 47X51 STRL (DRAPES) ×3 IMPLANT
DRESSING AQUACEL AG SP 3.5X10 (GAUZE/BANDAGES/DRESSINGS) ×1 IMPLANT
DRSG AQUACEL AG SP 3.5X10 (GAUZE/BANDAGES/DRESSINGS) ×3
DURAPREP 26ML APPLICATOR (WOUND CARE) ×6 IMPLANT
ELECT REM PT RETURN 9FT ADLT (ELECTROSURGICAL) ×3
ELECTRODE REM PT RTRN 9FT ADLT (ELECTROSURGICAL) ×1 IMPLANT
GLOVE BIOGEL M 7.0 STRL (GLOVE) ×4 IMPLANT
GLOVE BIOGEL PI IND STRL 7.5 (GLOVE) ×1 IMPLANT
GLOVE BIOGEL PI IND STRL 8.5 (GLOVE) ×1 IMPLANT
GLOVE BIOGEL PI INDICATOR 7.5 (GLOVE) ×12
GLOVE BIOGEL PI INDICATOR 8.5 (GLOVE)
GLOVE ECLIPSE 8.0 STRL XLNG CF (GLOVE) ×1 IMPLANT
GLOVE ORTHO TXT STRL SZ7.5 (GLOVE) ×6 IMPLANT
GOWN STRL REUS W/TWL LRG LVL3 (GOWN DISPOSABLE) ×3 IMPLANT
GOWN STRL REUS W/TWL XL LVL3 (GOWN DISPOSABLE) ×3 IMPLANT
HANDPIECE INTERPULSE COAX TIP (DISPOSABLE) ×3
MANIFOLD NEPTUNE II (INSTRUMENTS) ×3 IMPLANT
PACK TOTAL KNEE CUSTOM (KITS) ×3 IMPLANT
POSITIONER SURGICAL ARM (MISCELLANEOUS) ×3 IMPLANT
SET HNDPC FAN SPRY TIP SCT (DISPOSABLE) ×1 IMPLANT
SET PAD KNEE POSITIONER (MISCELLANEOUS) ×3 IMPLANT
SUT MNCRL AB 4-0 PS2 18 (SUTURE) ×3 IMPLANT
SUT VIC AB 1 CT1 36 (SUTURE) ×3 IMPLANT
SUT VIC AB 2-0 CT1 27 (SUTURE) ×9
SUT VIC AB 2-0 CT1 TAPERPNT 27 (SUTURE) ×3 IMPLANT
SUT VLOC 180 0 24IN GS25 (SUTURE) ×3 IMPLANT
SYR 50ML LL SCALE MARK (SYRINGE) ×3 IMPLANT
TRAY FOLEY W/METER SILVER 16FR (SET/KITS/TRAYS/PACK) ×3 IMPLANT
WATER STERILE IRR 1500ML POUR (IV SOLUTION) ×3 IMPLANT
WRAP KNEE MAXI GEL POST OP (GAUZE/BANDAGES/DRESSINGS) ×3 IMPLANT
YANKAUER SUCT BULB TIP 10FT TU (MISCELLANEOUS) ×3 IMPLANT

## 2016-08-07 NOTE — Anesthesia Preprocedure Evaluation (Addendum)
Anesthesia Evaluation  Patient identified by MRN, date of birth, ID band Patient awake    Reviewed: Allergy & Precautions, H&P , Patient's Chart, lab work & pertinent test results  Airway Mallampati: II  TM Distance: >3 FB Neck ROM: full    Dental no notable dental hx.    Pulmonary    Pulmonary exam normal breath sounds clear to auscultation       Cardiovascular Exercise Tolerance: Good  Rhythm:regular Rate:Normal     Neuro/Psych    GI/Hepatic   Endo/Other    Renal/GU      Musculoskeletal   Abdominal   Peds  Hematology   Anesthesia Other Findings   ASTHMA... From second hand smoke; Rare sx     Chest pain negative stress test - Dr. Ron Parker  Pneumonia From old CXR.... No sx, feels well   Reproductive/Obstetrics                            Anesthesia Physical Anesthesia Plan  ASA: III  Anesthesia Plan: Spinal   Post-op Pain Management:    Induction:   Airway Management Planned:   Additional Equipment:   Intra-op Plan:   Post-operative Plan:   Informed Consent: I have reviewed the patients History and Physical, chart, labs and discussed the procedure including the risks, benefits and alternatives for the proposed anesthesia with the patient or authorized representative who has indicated his/her understanding and acceptance.   Dental Advisory Given  Plan Discussed with: CRNA  Anesthesia Plan Comments: (Lab work confirmed with CRNA in room. Platelets okay. Discussed spinal anesthetic, and patient consents to the procedure:  included risk of possible headache,backache, failed block, allergic reaction, and nerve injury. This patient was asked if she had any questions or concerns before the procedure started. )        Anesthesia Quick Evaluation

## 2016-08-07 NOTE — Op Note (Signed)
NAME:  [name]                      MEDICAL RECORD NO.:  [id number]                             FACILITY:  [facility]      PHYSICIAN:  Pietro Cassis. Alvan Dame, M.D.  DATE OF BIRTH:  1946/03/16      DATE OF PROCEDURE:  08/07/2016                                     OPERATIVE REPORT         PREOPERATIVE DIAGNOSIS:  Right knee osteoarthritis.      POSTOPERATIVE DIAGNOSIS:  Right knee osteoarthritis.      FINDINGS:  The patient was noted to have complete loss of cartilage and   bone-on-bone arthritis with associated osteophytes in the medial and patellofemoral compartments of   the knee with a significant synovitis and associated effusion.      PROCEDURE:  Right total knee replacement.      COMPONENTS USED:  DePuy Attune rotating platform posterior stabilized knee   system, a size 6N femur, 5 tibia, size 8 PS AOX insert, and 35 anatomic patellar   button.      SURGEON:  Pietro Cassis. Alvan Dame, M.D.      ASSISTANT:  Nehemiah Massed, PA-C.      ANESTHESIA:  Spinal.      SPECIMENS:  None.      COMPLICATION:  None.      DRAINS:  None.  EBL: <100cc      TOURNIQUET TIME:   Total Tourniquet Time Documented: Thigh (Right) - 29 minutes Total: Thigh (Right) - 29 minutes  .      The patient was stable to the recovery room.      INDICATION FOR PROCEDURE:  RENNAE Graves is a 70 y.o. female patient of   mine.  The patient had been seen, evaluated, and treated conservatively in the   office with medication, activity modification, and injections.  The patient had   radiographic changes of bone-on-bone arthritis with endplate sclerosis and osteophytes noted.      The patient failed conservative measures including medication, injections, and activity modification, and at this point was ready for more definitive measures.   Based on the radiographic changes and failed conservative measures, the patient   decided to proceed with total knee replacement.  Risks of infection,   DVT, component failure,  need for revision surgery, postop course, and   expectations were all   discussed and reviewed.  Consent was obtained for benefit of pain   relief.      PROCEDURE IN DETAIL:  The patient was brought to the operative theater.   Once adequate anesthesia, preoperative antibiotics, 1 gm of Vancomycin, 2 gm of Ancef, 1 gm of Tranexamic Acid, and 10 mg of Decadron administered, the patient was positioned supine with the right thigh tourniquet placed.  The  right lower extremity was prepped and draped in sterile fashion.  A time-   out was performed identifying the patient, planned procedure, and   extremity.      The right lower extremity was placed in the [facility] leg holder.  The leg was   exsanguinated, tourniquet elevated to 250 mmHg.  A midline incision was  made followed by median parapatellar arthrotomy.  Following initial   exposure, attention was first directed to the patella.  Precut   measurement was noted to be 24 mm.  I resected down to 14 mm and used a   35 anatomic patellar button to restore patellar height as well as cover the cut   surface.      The lug holes were drilled and a metal shim was placed to protect the   patella from retractors and saw blades.      At this point, attention was now directed to the femur.  The femoral   canal was opened with a drill, irrigated to try to prevent fat emboli.  An   intramedullary rod was passed at 3 degrees valgus, 9 mm of bone was   resected off the distal femur.  Following this resection, the tibia was   subluxated anteriorly.  Using the extramedullary guide, 2 mm of bone was resected off   the proximal medial tibia.  We confirmed the gap would be   stable medially and laterally with a size 6 spacer block as well as confirmed   the cut was perpendicular in the coronal plane, checking with an alignment rod.      Once this was done, I sized the femur to be a size 6 in the anterior-   posterior dimension, chose a narrow component based  on medial and   lateral dimension.  The size 6 rotation block was then pinned in   position anterior referenced using the C-clamp to set rotation.  The   anterior, posterior, and  chamfer cuts were made without difficulty nor   notching making certain that I was along the anterior cortex to help   with flexion gap stability.      The final box cut was made off the lateral aspect of distal femur.      At this point, the tibia was sized to be a size 6, the size 6 tray was   then pinned in position through the medial third of the tubercle,   drilled, and keel punched.  Trial reduction was now carried with a 6 femur,  6 tibia, a size 8 PS insert, and the 35 anatomic patella botton.  The knee was brought to   extension, full extension with good flexion stability with the patella   tracking through the trochlea without application of pressure.  Given   all these findings the femoral lug holes were drilled and then the trial components removed.  Final components were   opened and cement was mixed.  The knee was irrigated with normal saline   solution and pulse lavage.  The synovial lining was   then injected with 30 cc of 0.25% Marcaine with epinephrine and 1 cc of Toradol plus 30 cc of NS for a    total of 61 cc.      The knee was irrigated.  Final implants were then cemented onto clean and   dried cut surfaces of bone with the knee brought to extension with a size 8 trial insert.      Once the cement had fully cured, the excess cement was removed   throughout the knee.  I confirmed I was satisfied with the range of   motion and stability, and the final size 8 PS AOX insert was chosen.  It was   placed into the knee.      The tourniquet had been let down at 29  minutes.  No significant   hemostasis required.  The   extensor mechanism was then reapproximated using #1 Vicryl and #0 V-lock sutures with the knee   in flexion.  The   remaining wound was closed with 2-0 Vicryl and running 4-0  Monocryl.   The knee was cleaned, dried, dressed sterilely using Dermabond and   Aquacel dressing.  The patient was then   brought to recovery room in stable condition, tolerating the procedure   well.   Please note that Physician Assistant, Nehemiah Massed, PA-C, was present for the entirety of the case, and was utilized for pre-operative positioning, peri-operative retractor management, general facilitation of the procedure.  He was also utilized for primary wound closure at the end of the case.              Pietro Cassis Alvan Dame, M.D.    08/07/2016 12:55 PM

## 2016-08-07 NOTE — Anesthesia Procedure Notes (Signed)
Anesthesia Regional Block:  Adductor canal block  Pre-Anesthetic Checklist: ,, timeout performed, Correct Patient, Correct Site, Correct Laterality, Correct Procedure, Correct Position, site marked, Risks and benefits discussed, at surgeon's request and post-op pain management   Prep: chloraprep       Needles:   Needle Type: Echogenic Needle     Needle Length: 9cm 9 cm Needle Gauge: 22 and 22 G    Additional Needles:  Procedures: ultrasound guided (picture in chart) Adductor canal block Narrative:  Injection made incrementally with aspirations every 5 mL. Anesthesiologist: Lyndle Herrlich  Additional Notes: 20cc .75% Ropivicaine

## 2016-08-07 NOTE — Progress Notes (Signed)
AssistedDr.K.Jackson with right, ultrasound guided, adductor canal block. Side rails up, monitors on throughout procedure. See vital signs in flow sheet. Tolerated Procedure well.  

## 2016-08-07 NOTE — Interval H&P Note (Signed)
History and Physical Interval Note:  08/07/2016 10:08 AM  [name]  has presented today for surgery, with the diagnosis of RIGHT KNEE OA  The various methods of treatment have been discussed with the patient and family. After consideration of risks, benefits and other options for treatment, the patient has consented to  Procedure(s): RIGHT TOTAL KNEE ARTHROPLASTY (Right) as a surgical intervention .  The patient's history has been reviewed, patient examined, no change in status, stable for surgery.  I have reviewed the patient's chart and labs.  Questions were answered to the patient's satisfaction.     Mauri Pole

## 2016-08-07 NOTE — Anesthesia Procedure Notes (Signed)
Spinal  Patient location during procedure: OR Staffing Anesthesiologist: Lyndle Herrlich Preanesthetic Checklist Completed: patient identified, site marked, surgical consent, pre-op evaluation, timeout performed, IV checked, risks and benefits discussed and monitors and equipment checked Spinal Block Patient position: sitting Prep: DuraPrep Patient monitoring: cardiac monitor, continuous pulse ox, blood pressure and heart rate Approach: midline Location: L3-4 Injection technique: catheter Needle Needle type: Tuohy and Sprotte  Needle gauge: 24 G Needle length: 12.7 cm Needle insertion depth: 6 cm Catheter type: closed end flexible Catheter size: 19 g Assessment Sensory level: T6 Additional Notes sprotte thru touhy; (+) CSF before and after injection RLD x 3 min

## 2016-08-07 NOTE — Anesthesia Postprocedure Evaluation (Signed)
Anesthesia Post Note  Patient: [name]  Procedure(s) Performed: Procedure(s) (LRB): RIGHT TOTAL KNEE ARTHROPLASTY (Right)  Patient location during evaluation: PACU Anesthesia Type: Spinal Level of consciousness: awake Pain management: satisfactory to patient Vital Signs Assessment: post-procedure vital signs reviewed and stable Respiratory status: spontaneous breathing Cardiovascular status: blood pressure returned to baseline Postop Assessment: no headache and spinal receding Anesthetic complications: no    Last Vitals:  Vitals:   08/07/16 1643 08/07/16 1748  BP: 118/62 126/62  Pulse: 62 66  Resp: 16 16  Temp: 36.4 C 36.4 C    Last Pain:  Vitals:   08/07/16 1748  TempSrc: Oral  PainSc: 0-No pain                 Rayette Mogg EDWARD

## 2016-08-07 NOTE — Transfer of Care (Signed)
Immediate Anesthesia Transfer of Care Note  Patient: [name]  Procedure(s) Performed: Procedure(s): RIGHT TOTAL KNEE ARTHROPLASTY (Right)  Patient Location: PACU  Anesthesia Type:Spinal  Level of Consciousness: awake, alert , oriented and patient cooperative  Airway & Oxygen Therapy: Patient Spontanous Breathing and Patient connected to face mask oxygen  Post-op Assessment: Report given to RN and Post -op Vital signs reviewed and stable  Post vital signs: Reviewed and stable  Last Vitals:  Vitals:   08/07/16 1105 08/07/16 1110  BP: 133/74 133/80  Pulse: 73 67  Resp: (!) 23 19  Temp:      Last Pain:  Vitals:   08/07/16 0911  TempSrc: Oral         Complications: No apparent anesthesia complications

## 2016-08-08 LAB — BASIC METABOLIC PANEL
Anion gap: 4 — ABNORMAL LOW (ref 5–15)
BUN: 22 mg/dL — AB (ref 6–20)
CHLORIDE: 107 mmol/L (ref 101–111)
CO2: 26 mmol/L (ref 22–32)
CREATININE: 0.68 mg/dL (ref 0.44–1.00)
Calcium: 9 mg/dL (ref 8.9–10.3)
GFR calc Af Amer: >60 mL/min (ref >60)
GFR calc non Af Amer: >60 mL/min (ref >60)
GLUCOSE: 161 mg/dL — AB (ref 65–99)
Potassium: 4.9 mmol/L (ref 3.5–5.1)
Sodium: 137 mmol/L (ref 135–145)

## 2016-08-08 LAB — CBC
HEMATOCRIT: 39.4 % (ref 36.0–46.0)
HEMOGLOBIN: 12.6 g/dL (ref 12.0–15.0)
MCH: 31 pg (ref 26.0–34.0)
MCHC: 32 g/dL (ref 30.0–36.0)
MCV: 97 fL (ref 78.0–100.0)
Platelets: 166 10*3/uL (ref 150–400)
RBC: 4.06 MIL/uL (ref 3.87–5.11)
RDW: 12.8 % (ref 11.5–15.5)
WBC: 14.3 10*3/uL — ABNORMAL HIGH (ref 4.0–10.5)

## 2016-08-08 MED ORDER — HYDROCODONE-ACETAMINOPHEN 7.5-325 MG PO TABS: 1.0000 | ORAL_TABLET | ORAL | 0 refills | Status: DC | PRN

## 2016-08-08 MED ORDER — POLYETHYLENE GLYCOL 3350 17 G PO PACK: 17.0000 g | PACK | Freq: Two times a day (BID) | ORAL | 0 refills | Status: DC

## 2016-08-08 MED ORDER — DOCUSATE SODIUM 100 MG PO CAPS: 100.0000 mg | ORAL_CAPSULE | Freq: Two times a day (BID) | ORAL | 0 refills | Status: DC

## 2016-08-08 MED ORDER — METHOCARBAMOL 500 MG PO TABS
500.0000 mg | ORAL_TABLET | Freq: Four times a day (QID) | ORAL | 0 refills | Status: DC | PRN
Start: 2016-08-08 — End: 2016-11-06

## 2016-08-08 MED ORDER — FERROUS SULFATE 325 (65 FE) MG PO TABS: 325.0000 mg | ORAL_TABLET | Freq: Three times a day (TID) | ORAL | 3 refills | Status: DC

## 2016-08-08 MED ORDER — ASPIRIN 81 MG PO CHEW: 81.0000 mg | CHEWABLE_TABLET | Freq: Two times a day (BID) | ORAL | 0 refills | Status: DC

## 2016-08-08 NOTE — Progress Notes (Signed)
     Subjective: 1 Day Post-Op Procedure(s) (LRB): RIGHT TOTAL KNEE ARTHROPLASTY (Right)   Patient reports pain as mild, pain controlled. No events throughout the night. Sitting up enjoying breakfast this morning.  Ready to be discharged home.   Objective:   VITALS:   Vitals:   08/08/16 0115 08/08/16 0620  BP: (!) 116/57 (!) 127/57  Pulse: 84 67  Resp: 20 20  Temp: 97.8 F (36.6 C) 97.7 F (36.5 C)    Dorsiflexion/Plantar flexion intact Incision: dressing C/D/I No cellulitis present Compartment soft  LABS  Recent Labs  08/08/16 0422  HGB 12.6  HCT 39.4  WBC 14.3*  PLT 166     Recent Labs  08/08/16 0422  NA 137  K 4.9  BUN 22*  CREATININE 0.68  GLUCOSE 161*     Assessment/Plan: 1 Day Post-Op Procedure(s) (LRB): RIGHT TOTAL KNEE ARTHROPLASTY (Right) Foley cath d/c'ed Advance diet Up with therapy D/C IV fluids Discharge home with home health  Follow [facility] in 2 [facility] at [facility]. Follow up with OLIN,Arman Loy D in 2 weeks.  Contact information:  [facility] [address], [place] [id number]    Obese (BMI 30-39.9) Estimated body mass index is 38.91 kg/m as calculated from the following:   Height as of this encounter: 5' 4.5" (1.638 m).   Weight as of this encounter: 104.4 kg (230 lb 4 oz). Patient also counseled that weight may inhibit the healing process Patient counseled that losing weight will help with future health issues         West Pugh. Letroy Vazguez   PAC  08/08/2016, 8:57 AM

## 2016-08-08 NOTE — Progress Notes (Signed)
Physical Therapy Treatment Patient Details Name: [name] MRN: [id number] DOB: 02-Apr-1946 Today's Date: 08/08/2016    History of Present Illness Pt s/p  R TKR    PT Comments    Pt progressing well with mobility and eager for dc to home.  Reviewed home therex and stairs  Follow Up Recommendations  Home health PT     Equipment Recommendations  Rolling walker with 5" wheels    Recommendations for Other Services OT consult     Precautions / Restrictions Precautions Precautions: Fall;Knee Restrictions Weight Bearing Restrictions: No Other Position/Activity Restrictions: WBAT    Mobility  Bed Mobility Overal bed mobility: Needs Assistance Bed Mobility: Supine to Sit;Sit to Supine     Supine to sit: Supervision Sit to supine: Supervision   General bed mobility comments: cues for sequence and use of L LE to self assist  Transfers Overall transfer level: Needs assistance Equipment used: Rolling walker (2 wheeled) Transfers: Sit to/from Stand Sit to Stand: Supervision         General transfer comment: cues for LE management and use of UEs to self assist  Ambulation/Gait Ambulation/Gait assistance: Min guard;Supervision Ambulation Distance (Feet): 123 Feet Assistive device: Rolling walker (2 wheeled) Gait Pattern/deviations: Step-to pattern;Decreased step length - right;Decreased step length - left;Shuffle;Trunk flexed Gait velocity: decr Gait velocity interpretation: Below normal speed for age/gender General Gait Details: cues for sequence, posture and position from RW.  Distance ltd by increasing nausea and dizziness - BP 119/70   Stairs Stairs: Yes Stairs assistance: Min guard Stair Management: Two rails;Step to pattern;Forwards Number of Stairs: 5 General stair comments: min cues for sequence and foot placement  Wheelchair Mobility    Modified Rankin (Stroke Patients Only)       Balance                                     Cognition Arousal/Alertness: Awake/alert Behavior During Therapy: WFL for tasks assessed/performed Overall Cognitive Status: Within Functional Limits for tasks assessed                      Exercises Total Joint Exercises Ankle Circles/Pumps: AROM;Both;15 reps;Supine Quad Sets: AROM;Both;10 reps;Supine Heel Slides: AAROM;15 reps;Right;Supine Straight Leg Raises: AAROM;AROM;Right;Supine;15 reps Long Arc Quad: AAROM;Right;10 reps;Seated Knee Flexion: AAROM;10 reps;Right;Seated    General Comments        Pertinent Vitals/Pain Pain Assessment: 0-10 Pain Score: 5  Pain Location: R knee Pain Descriptors / Indicators: Aching;Sore Pain Intervention(s): Monitored during session;Limited activity within patient's tolerance;Ice applied    Home Living                      Prior Function            PT Goals (current goals can now be found in the care plan section) Acute Rehab PT Goals Patient Stated Goal: Regain IND and buy a Can Am roadster PT Goal Formulation: With patient Time For Goal Achievement: 08/11/16 Potential to Achieve Goals: Good Progress towards PT goals: Progressing toward goals    Frequency    7X/week      PT Plan Current plan remains appropriate    Co-evaluation             End of Session Equipment Utilized During Treatment: Gait belt Activity Tolerance: Patient tolerated treatment well Patient left: in bed;with call bell/phone within reach;with family/visitor present  Time: [id number] PT Time Calculation (min) (ACUTE ONLY): 37 min  Charges:                       G Codes:      Arthi Mcdonald 2016-08-20, 2:55 PM

## 2016-08-08 NOTE — Evaluation (Signed)
Physical Therapy Evaluation Patient Details Name: [name] MRN: [id number] DOB: 08/04/1946 Today's Date: 08/08/2016   History of Present Illness  Pt s/p  R TKR  Clinical Impression  Pt s/p R TKR presents with decreased R LE strength/ROM and post op pain limiting functional mobility.  Pt should progress to dc home with family assist and HHPT follow up.    Follow Up Recommendations Home health PT    Equipment Recommendations  Rolling walker with 5" wheels    Recommendations for Other Services OT consult     Precautions / Restrictions Precautions Precautions: Fall;Knee Restrictions Weight Bearing Restrictions: No Other Position/Activity Restrictions: WBAT      Mobility  Bed Mobility Overal bed mobility: Needs Assistance Bed Mobility: Supine to Sit     Supine to sit: Min assist     General bed mobility comments: cues for sequence and use of L LE to self assist  Transfers Overall transfer level: Needs assistance Equipment used: Rolling walker (2 wheeled) Transfers: Sit to/from Stand Sit to Stand: Min assist         General transfer comment: cues for LE management and use of UEs to self assist  Ambulation/Gait Ambulation/Gait assistance: Min assist Ambulation Distance (Feet): 69 Feet Assistive device: Rolling walker (2 wheeled) Gait Pattern/deviations: Step-to pattern;Decreased step length - right;Decreased step length - left;Shuffle;Trunk flexed Gait velocity: decr Gait velocity interpretation: Below normal speed for age/gender General Gait Details: cues for sequence, posture and position from RW.  Distance ltd by increasing nausea and dizziness - BP 119/70  Stairs            Wheelchair Mobility    Modified Rankin (Stroke Patients Only)       Balance                                             Pertinent Vitals/Pain Pain Assessment: 0-10 Pain Score: 5  Pain Location: R knee Pain Descriptors / Indicators:  Aching;Sore Pain Intervention(s): Limited activity within patient's tolerance;Monitored during session;Premedicated before session;Ice applied    Home Living Family/patient expects to be discharged to:: Private residence Living Arrangements: Spouse/significant other Available Help at Discharge: Family Type of Home: House Home Access: Stairs to enter Entrance Stairs-Rails: Right;Left;Can reach both Technical brewer of Steps: 4 Home Layout: One level Home Equipment: Bedside commode      Prior Function Level of Independence: Independent               Hand Dominance        Extremity/Trunk Assessment   Upper Extremity Assessment: Overall WFL for tasks assessed           Lower Extremity Assessment: RLE deficits/detail RLE Deficits / Details: 3/5 quads with IND SLR and AAROM at knee -10 - 60    Cervical / Trunk Assessment: Normal  Communication   Communication: No difficulties  Cognition Arousal/Alertness: Awake/alert Behavior During Therapy: WFL for tasks assessed/performed Overall Cognitive Status: Within Functional Limits for tasks assessed                      General Comments      Exercises Total Joint Exercises Ankle Circles/Pumps: AROM;Both;15 reps;Supine Quad Sets: AROM;Both;10 reps;Supine Heel Slides: AAROM;15 reps;Right;Supine Straight Leg Raises: AAROM;AROM;Right;10 reps;Supine   Assessment/Plan    PT Assessment Patient needs continued PT services  PT Problem List Decreased strength;Decreased range  of motion;Decreased activity tolerance;Decreased mobility;Decreased knowledge of use of DME;Pain          PT Treatment Interventions DME instruction;Gait training;Stair training;Functional mobility training;Therapeutic activities;Therapeutic exercise;Patient/family education    PT Goals (Current goals can be found in the Care Plan section)  Acute Rehab PT Goals Patient Stated Goal: Regain IND and buy a Can Am roadster PT Goal  Formulation: With patient Time For Goal Achievement: 08/11/16 Potential to Achieve Goals: Good    Frequency 7X/week   Barriers to discharge        Co-evaluation               End of Session Equipment Utilized During Treatment: Gait belt Activity Tolerance: Other (comment) (nausea) Patient left: in chair;with call bell/phone within reach;with family/visitor present Nurse Communication: Mobility status;Other (comment) (nausea)         Time: [id number] PT Time Calculation (min) (ACUTE ONLY): 38 min   Charges:   PT Evaluation $PT Eval Low Complexity: 1 Procedure PT Treatments $Gait Training: 8-22 mins $Therapeutic Exercise: 8-22 mins   PT G Codes:        Yovany Clock Aug 31, 2016, 11:30 AM

## 2016-08-08 NOTE — Progress Notes (Signed)
OT Cancellation Note  Patient Details Name: [name] MRN: [id number] DOB: 06/30/1946   Cancelled Treatment:    Reason Eval/Treat Not Completed: Other (comment). Pt nauseous and dizzy with PT. Will likely check back tomorrow.  [name] 08/08/2016, 9:20 AM  Lesle Chris, OTR/L [phone] 08/08/2016

## 2016-08-08 NOTE — Discharge Instructions (Signed)

## 2016-08-08 NOTE — Care Management Note (Signed)
Case Management Note  Patient Details  Name: [name] MRN: [id number] Date of Birth: 1946/04/19  Subjective/Objective: 70 y.o. F admitted 08/07/2016 for R TKA. Has assistance at home in the immediate postop period. Tells me she has safety bars in the bathroom to assist with toileting so 3 n1 is not necessary.RW ordered from [facility]on [facility]ills). [facility], confirmed as her choice,  will provide [facility] services.                    Action/Plan: Discharge later today.    Expected Discharge Date:                  Expected Discharge Plan:  [place]  In-House Referral:  NA  Discharge planning Services  CM Consult  Post Acute Care Choice:  Durable Medical Equipment, Home Health Choice offered to:  Patient  DME Arranged:  Walker rolling DME Agency:  [place]:  [facility] [place]:  [facility] (now [facility])  Status of Service:  Completed, signed off  If discussed at [employer] of Stay Meetings, dates discussed:    Additional Comments:  Delrae Sawyers, RN 08/08/2016, 10:48 AM

## 2016-08-08 NOTE — Progress Notes (Signed)
Patient ready for discharge. Discharge instructions and prescriptions reviewed with pt and family. Pt will go home with husband.

## 2016-08-15 NOTE — Discharge Summary (Signed)
Physician Discharge Summary  Patient ID: QUANAH SLINGER MRN: [id number] DOB/AGE: 06-26-1946 70 y.o.  Admit date: 08/07/2016 Discharge date: 08/08/2016   Procedures:  Procedure(s) (LRB): RIGHT TOTAL KNEE ARTHROPLASTY (Right)  Attending Physician:  Dr. Paralee Cancel   Admission Diagnoses:   Right knee primary OA / pain  Discharge Diagnoses:  Principal Problem:   S/P right TKA Active Problems:   S/P knee replacement  Past Medical History:  Diagnosis Date  . Allergy   . ASCUS on Pap smear 2007  . ASTHMA UNSPECIFIED WITH EXACERBATION 11/08/2008  . Cancer of skin of left ear   . Chest pain 03/2011   negative stress test - Dr. Ron Parker  . History of frequent urinary tract infections   . History of kidney stones   . [place], [place] 02/09/2009  . Kidney stones   . OSTEOARTHRITIS 04/24/2007  . Osteopenia   . Osteoporosis   . Pneumonia   . PONV (postoperative nausea and vomiting)   . SHINGLES 11/08/2008   had vaccine at drugstore 12/14  . VARICOSE VEINS LOWER EXTREMITIES W/INFLAMMATION 02/05/2008    HPI:     [name], 70 y.o. female, has a history of pain and functional disability in the right knee due to arthritis and has failed non-surgical conservative treatments for greater than 12 weeks to include NSAID's and/or analgesics, corticosteriod injections, viscosupplementation injections and activity modification.  Onset of symptoms was gradual, starting 1+ years ago with gradually worsening course since that time. The patient noted prior procedures on the knee to include  arthroscopy on the right knee(s).  Patient currently rates pain in the right knee(s) at 8 out of 10 with activity. Patient has worsening of pain with activity and weight bearing, pain that interferes with activities of daily living, pain with passive range of motion, crepitus and joint swelling.  Patient has evidence of periarticular osteophytes and joint space narrowing by imaging studies.  There is no active  infection.   Risks, benefits and expectations were discussed with the patient.  Risks including but not limited to the risk of anesthesia, blood clots, nerve damage, blood vessel damage, failure of the prosthesis, infection and up to and including death.  Patient understand the risks, benefits and expectations and wishes to proceed with surgery.   PCP: Garret Reddish, MD   Discharged Condition: good  Hospital Course:  Patient underwent the above stated procedure on 08/07/2016. Patient tolerated the procedure well and brought to the recovery room in good condition and subsequently to the floor.  POD #1 BP: 127/57 ; Pulse: 67 ; Temp: 97.7 F (36.5 C) ; Resp: 20 Patient reports pain as mild, pain controlled. No events throughout the night. Sitting up enjoying breakfast this morning.  Ready to be discharged home. Dorsiflexion/plantar flexion intact, incision: dressing C/D/I, no cellulitis present and compartment soft.   LABS  Basename    HGB     12.6  HCT     39.4    Discharge Exam: General appearance: alert, cooperative and no distress Extremities: Homans sign is negative, no sign of DVT, no edema, redness or tenderness in the calves or thighs and no ulcers, gangrene or trophic changes  Disposition: Home with follow up in 2 weeks   Follow-up Information    Mauri Pole, MD. Schedule an appointment as soon as possible for a visit in 2 week(s).   Specialty:  Orthopedic Surgery Contact information: [address] [place] [address] [phone]  [place] .   Why:  RW will be delivered to your room prior to discharge Contact information: [address] [address] [place] .   Specialty:  Home Health Services Why:  HHPT has been ordered by your MD. A representative will contact you within 24 hours of your discharge to make arrangements for your initial visit.  Contact information: [address] [place] [address] [phone]           Discharge Instructions    Call MD / Call 911    Complete by:  As directed    If you experience chest pain or shortness of breath, CALL 911 and be transported to the hospital emergency room.  If you develope a fever above 101 F, pus (white drainage) or increased drainage or redness at the wound, or calf pain, call your surgeon's office.   Change dressing    Complete by:  As directed    Maintain surgical dressing until follow up in the clinic. If the edges start to pull up, may reinforce with tape. If the dressing is no longer working, may remove and cover with gauze and tape, but must keep the area dry and clean.  Call with any questions or concerns.   Constipation Prevention    Complete by:  As directed    Drink plenty of fluids.  Prune juice may be helpful.  You may use a stool softener, such as Colace (over the counter) 100 mg twice a day.  Use MiraLax (over the counter) for constipation as needed.   Diet - low sodium heart healthy    Complete by:  As directed    Discharge instructions    Complete by:  As directed    Maintain surgical dressing until follow up in the clinic. If the edges start to pull up, may reinforce with tape. If the dressing is no longer working, may remove and cover with gauze and tape, but must keep the area dry and clean.  Follow [facility] in 2 [facility] at [facility]. Call with any questions or concerns.   Increase activity slowly as tolerated    Complete by:  As directed    Weight bearing as tolerated with assist device (walker, cane, etc) as directed, use it as long as suggested by your surgeon or therapist, typically at least 4-6 weeks.   TED hose    Complete by:  As directed    Use stockings (TED hose) for 2 weeks on both leg(s).  You may remove them at night for sleeping.        Medication List    STOP taking these medications   aspirin 81 MG EC tablet Replaced by:  aspirin 81 MG  chewable tablet   meloxicam 7.5 MG tablet Commonly known as:  MOBIC     TAKE these medications   albuterol 108 (90 Base) MCG/ACT inhaler Commonly known as:  PROVENTIL HFA;VENTOLIN HFA Inhale 2 puffs into the lungs every 6 (six) hours as needed for wheezing or shortness of breath.   aspirin 81 MG chewable tablet Chew 1 tablet (81 mg total) by mouth 2 (two) times daily. Take for 4 weeks, then resume regular dose. Replaces:  aspirin 81 MG EC tablet   Calcium Citrate 250 MG Tabs Take 1,000 mg by mouth 2 (two) times daily.   docusate sodium 100 MG capsule Commonly known as:  COLACE  Take 1 capsule (100 mg total) by mouth 2 (two) times daily.   ferrous sulfate 325 (65 FE) MG tablet Take 1 tablet (325 mg total) by mouth 3 (three) times daily after meals.   fish oil-omega-3 fatty acids 1000 MG capsule Take 2 capsules (2 g total) by mouth 2 (two) times daily. total for 4,000 mg What changed:  how much to take  when to take this  additional instructions   fluticasone 110 MCG/ACT inhaler Commonly known as:  FLOVENT HFA Inhale 2 puffs into the lungs 2 (two) times daily. What changed:  when to take this   GLUCOSAMINE SULFATE PO Take 2 tablets by mouth 2 (two) times daily.   HYDROcodone-acetaminophen 7.5-325 MG tablet Commonly known as:  NORCO Take 1-2 tablets by mouth every 4 (four) hours as needed for moderate pain.   ICAPS AREDS FORMULA PO Take 1 capsule by mouth 2 (two) times daily with a meal.   methocarbamol 500 MG tablet Commonly known as:  ROBAXIN Take 1 tablet (500 mg total) by mouth every 6 (six) hours as needed for muscle spasms.   multivitamin tablet Take 1 tablet by mouth daily.   PAZEO 0.7 % Soln Generic drug:  Olopatadine HCl Apply 1 drop to eye daily as needed (for watery eyes).   polyethylene glycol packet Commonly known as:  MIRALAX / GLYCOLAX Take 17 g by mouth 2 (two) times daily.   rosuvastatin 10 MG tablet Commonly known as:  CRESTOR TAKE ONE  TABLET BY MOUTH ONCE DAILY   Vitamin D3 2000 units Tabs Take 2,000 Units by mouth 2 (two) times daily.   ZYRTEC ALLERGY 10 MG tablet Generic drug:  cetirizine Take 10 mg by mouth at bedtime.        Signed: [place]. Avynn Klassen   PA-C  08/15/2016, 9:47 AM

## 2016-09-26 ENCOUNTER — Encounter: Payer: Medicare Other | Admitting: Women's Health

## 2016-10-18 ENCOUNTER — Encounter: Payer: Medicare Other | Admitting: Women's Health

## 2016-10-24 ENCOUNTER — Telehealth: Payer: Self-pay | Admitting: Family Medicine

## 2016-10-24 NOTE — Telephone Encounter (Signed)
[name] pt returned your call stated it is something about labs for tomorrow.

## 2016-10-25 ENCOUNTER — Other Ambulatory Visit (INDEPENDENT_AMBULATORY_CARE_PROVIDER_SITE_OTHER): Payer: Medicare Other

## 2016-10-25 DIAGNOSIS — E785 Hyperlipidemia, unspecified: Secondary | ICD-10-CM | POA: Diagnosis not present

## 2016-10-25 LAB — LIPID PANEL
CHOL/HDL RATIO: 2
CHOLESTEROL: 143 mg/dL (ref 0–200)
HDL: 59 mg/dL (ref >39.00)
LDL Cholesterol: 72 mg/dL (ref 0–99)
NONHDL: 84.15
TRIGLYCERIDES: 61 mg/dL (ref 0.0–149.0)
VLDL: 12.2 mg/dL (ref 0.0–40.0)

## 2016-10-25 LAB — CBC WITH DIFFERENTIAL/PLATELET
BASOS PCT: 0.5 % (ref 0.0–3.0)
Basophils Absolute: 0 10*3/uL (ref 0.0–0.1)
EOS ABS: 0.1 10*3/uL (ref 0.0–0.7)
Eosinophils Relative: 1.4 % (ref 0.0–5.0)
HEMATOCRIT: 39.8 % (ref 36.0–46.0)
Hemoglobin: 13.7 g/dL (ref 12.0–15.0)
LYMPHS PCT: 15.9 % (ref 12.0–46.0)
Lymphs Abs: 0.9 10*3/uL (ref 0.7–4.0)
MCHC: 34.3 g/dL (ref 30.0–36.0)
MCV: 92.9 fl (ref 78.0–100.0)
Monocytes Absolute: 0.5 10*3/uL (ref 0.1–1.0)
Monocytes Relative: 8.5 % (ref 3.0–12.0)
NEUTROS ABS: 4 10*3/uL (ref 1.4–7.7)
Neutrophils Relative %: 73.7 % (ref 43.0–77.0)
PLATELETS: 151 10*3/uL (ref 150.0–400.0)
RBC: 4.29 Mil/uL (ref 3.87–5.11)
RDW: 12.7 % (ref 11.5–15.5)
WBC: 5.4 10*3/uL (ref 4.0–10.5)

## 2016-10-25 LAB — POC URINALSYSI DIPSTICK (AUTOMATED)
BILIRUBIN UA: NEGATIVE
Glucose, UA: NEGATIVE
KETONES UA: NEGATIVE
Nitrite, UA: NEGATIVE
PH UA: 6.5
PROTEIN UA: NEGATIVE
SPEC GRAV UA: 1.02
Urobilinogen, UA: 0.2

## 2016-10-25 LAB — HEPATIC FUNCTION PANEL
ALBUMIN: 4 g/dL (ref 3.5–5.2)
ALT: 23 U/L (ref 0–35)
AST: 22 U/L (ref 0–37)
Alkaline Phosphatase: 101 U/L (ref 39–117)
BILIRUBIN DIRECT: 0.1 mg/dL (ref 0.0–0.3)
TOTAL PROTEIN: 6.5 g/dL (ref 6.0–8.3)
Total Bilirubin: 0.7 mg/dL (ref 0.2–1.2)

## 2016-10-25 LAB — BASIC METABOLIC PANEL
BUN: 23 mg/dL (ref 6–23)
CHLORIDE: 105 meq/L (ref 96–112)
CO2: 30 meq/L (ref 19–32)
CREATININE: 0.71 mg/dL (ref 0.40–1.20)
Calcium: 9.2 mg/dL (ref 8.4–10.5)
GFR: 86.53 mL/min (ref >60.00)
Glucose, Bld: 96 mg/dL (ref 70–99)
Potassium: 4.6 mEq/L (ref 3.5–5.1)
Sodium: 142 mEq/L (ref 135–145)

## 2016-10-25 LAB — TSH: TSH: 2.41 u[IU]/mL (ref 0.35–4.50)

## 2016-10-25 NOTE — Telephone Encounter (Signed)
Pt will kept her lab appt today

## 2016-10-27 ENCOUNTER — Other Ambulatory Visit: Payer: Self-pay | Admitting: Family Medicine

## 2016-11-01 ENCOUNTER — Encounter: Payer: Medicare Other | Admitting: Family Medicine

## 2016-11-02 ENCOUNTER — Other Ambulatory Visit: Payer: Self-pay | Admitting: Cardiovascular Disease

## 2016-11-06 ENCOUNTER — Ambulatory Visit (INDEPENDENT_AMBULATORY_CARE_PROVIDER_SITE_OTHER): Payer: Medicare Other | Admitting: Family Medicine

## 2016-11-06 ENCOUNTER — Encounter: Payer: Self-pay | Admitting: Family Medicine

## 2016-11-06 VITALS — BP 108/68 | HR 77 | Temp 97.9°F | Ht 63.5 in | Wt 234.2 lb

## 2016-11-06 DIAGNOSIS — Z0000 Encounter for general adult medical examination without abnormal findings: Secondary | ICD-10-CM

## 2016-11-06 NOTE — Progress Notes (Addendum)
Phone: [phone]  Subjective:  Patient presents today for their annual physical. Chief complaint-noted.   See problem oriented charting- ROS- full  review of systems was completed and negative except for: some pain still in her knee but much improved, some stiffness but improved. No shortness of breath.   The following were reviewed and entered/updated in epic: Past Medical History:  Diagnosis Date  . Allergy   . ASCUS on Pap smear 2007  . ASTHMA UNSPECIFIED WITH EXACERBATION 11/08/2008  . Cancer of skin of left ear   . Chest pain 03/2011   negative stress test - Dr. Ron Parker  . History of frequent urinary tract infections   . History of kidney stones   . [place], [place] 02/09/2009  . Kidney stones   . OSTEOARTHRITIS 04/24/2007  . Osteopenia   . Osteoporosis   . Pneumonia   . PONV (postoperative nausea and vomiting)   . SHINGLES 11/08/2008   had vaccine at drugstore 12/14  . VARICOSE VEINS LOWER EXTREMITIES W/INFLAMMATION 02/05/2008   Patient Active Problem List   Diagnosis Date Noted  . Atypical chest pain 12/27/2014    Priority: Medium  . Hyperlipidemia with target LDL less than 100 02/09/2009    Priority: Medium  . COPD (chronic obstructive pulmonary disease) ([place]) 02/05/2008    Priority: Medium  . Obesity, unspecified 06/17/2014    Priority: Low  . Kidney stones 06/17/2014    Priority: Low  . Osteopenia     Priority: Low  . ALLERGIC RHINITIS 05/07/2008    Priority: Low  . VARICOSE VEINS LOWER EXTREMITIES W/INFLAMMATION 02/05/2008    Priority: Low  . Osteoarthritis 04/24/2007    Priority: Low  . S/P right TKA 08/07/2016  . Dysphagia 11/14/2015  . Palpitations 12/27/2014   Past Surgical History:  Procedure Laterality Date  . Bone spur shoulder     x2 removed  . COLPOSCOPY     Neg HR HPV  . KNEE ARTHROSCOPY    . laser surgery per left ey     secondary to film covering   . lens implants- both eyes    . SPINE SURGERY  2008   lumbar  . TOTAL KNEE  ARTHROPLASTY Right 08/07/2016   Procedure: RIGHT TOTAL KNEE ARTHROPLASTY;  Surgeon: Paralee Cancel, MD;  Location: [facility];  Service: Orthopedics;  Laterality: Right;    Family History  Problem Relation Age of Onset  . Ovarian cancer Mother   . Melanoma Mother   . Hypertension Father   . Heart disease Father     MI 65  . Heart disease Paternal Grandfather   . Cancer Maternal Grandmother     Oral cancer  . Down syndrome Sister   . Prostate cancer Maternal Uncle   . Heart disease Maternal Grandfather   . Prostate cancer Maternal Uncle   . Prostate cancer Maternal Uncle   . Breast cancer Cousin     two maternal cousins diagnosed <50  . Diabetes Paternal Aunt   . Sudden Cardiac Death Son     unclear cause.   . Colon cancer Neg Hx   . Esophageal cancer Neg Hx   . Rectal cancer Neg Hx   . Stomach cancer Neg Hx     Medications- reviewed and updated Current Outpatient Prescriptions  Medication Sig Dispense Refill  . albuterol (PROVENTIL HFA;VENTOLIN HFA) 108 (90 Base) MCG/ACT inhaler Inhale 2 puffs into the lungs every 6 (six) hours as needed for wheezing or shortness of breath. 1 Inhaler 5  . aspirin  81 MG chewable tablet Chew 1 tablet (81 mg total) by mouth 2 (two) times daily. Take for 4 weeks, then resume regular dose. 60 tablet 0  . Calcium Citrate 250 MG TABS Take 1,000 mg by mouth 2 (two) times daily.    . cetirizine (ZYRTEC ALLERGY) 10 MG tablet Take 10 mg by mouth at bedtime.      . Cholecalciferol (VITAMIN D3) 2000 UNITS TABS Take 2,000 Units by mouth 2 (two) times daily.      . fish oil-omega-3 fatty acids 1000 MG capsule Take 2 capsules (2 g total) by mouth 2 (two) times daily. total for 4,000 mg (Patient taking differently: Take 1 g by mouth daily. )    . fluticasone (FLOVENT HFA) 110 MCG/ACT inhaler Inhale 2 puffs into the lungs 2 (two) times daily. (Patient taking differently: Inhale 2 puffs into the lungs daily. ) 1 Inhaler 12  . GLUCOSAMINE SULFATE PO Take 2 tablets by  mouth 2 (two) times daily.     . meloxicam (MOBIC) 7.5 MG tablet TAKE ONE TABLET BY MOUTH TWICE DAILY 180 tablet 2  . Multiple Vitamin (MULTIVITAMIN) tablet Take 1 tablet by mouth daily.    . Multiple Vitamins-Minerals (ICAPS AREDS FORMULA PO) Take 1 capsule by mouth 2 (two) times daily with a meal.     . Olopatadine HCl (PAZEO) 0.7 % SOLN Apply 1 drop to eye daily as needed (for watery eyes).     . rosuvastatin (CRESTOR) 10 MG tablet TAKE ONE TABLET BY MOUTH ONCE DAILY 30 tablet 3   No current facility-administered medications for this visit.     Allergies-reviewed and updated Allergies  Allergen Reactions  . Atorvastatin     Myalgia  . Cefuroxime Axetil Diarrhea and Nausea And Vomiting    Social History   Social History  . Marital status: Married    Spouse name: N/A  . Number of children: 2  . Years of education: N/A   Occupational History  . Retired    Social History Main Topics  . Smoking status: Passive Smoke Exposure - Never Smoker  . Smokeless tobacco: Never Used     Comment: last exposure 1999  . Alcohol use 0.6 oz/week    1 Standard drinks or equivalent per week     Comment: monthly  . Drug use: No  . Sexual activity: Yes    Birth control/ protection: Post-menopausal   Other Topics Concern  . None   Social History Narrative   Married 44 years in 2015. 2 kids. 2 grandkids.       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       Hobbies: works with grandsons in [place], Haematologist (hates housework), goes on 1 month motorcyle biking trip each year   Objective: BP 108/68 (BP Location: Left Arm, Patient Position: Sitting, Cuff Size: Large)   Pulse 77   Temp 97.9 F (36.6 C) (Oral)   Ht 5' 3.5" (1.613 m)   Wt 234 lb 3.2 oz (106.2 kg)   SpO2 95%   BMI 40.84 kg/m  Gen: NAD, resting comfortably HEENT: Mucous membranes are moist. Oropharynx normal. Good dentition Neck: no thyromegaly CV: RRR no murmurs rubs or  gallops Lungs: CTAB no crackles, wheeze, rhonchi Abdomen: soft/nontender/nondistended/normal bowel sounds. No rebound or guarding. obese Ext: trace edema Skin: warm, dry, mild excoriated area on left ankle about 2 x 2 cm Neuro: grossly normal, moves all extremities, PERRLA  Assessment/Plan:  71 y.o. female presenting for annual physical.  Health Maintenance counseling: 1. Anticipatory guidance: Patient counseled regarding regular dental exams, eye exams, wearing seatbelts.  2. Risk factor reduction:  Advised patient of need for regular exercise and diet rich and fruits and vegetables to reduce risk of heart attack and stroke. She is going to restart exercise in the water and healthy eating- has been a hard time with knee replacement.  3. Immunizations/screenings/ancillary studies Immunization History  Administered Date(s) Administered  . Influenza Whole 08/15/2000, 08/06/2008  . Influenza, High Dose Seasonal PF 10/25/2014, 07/16/2016  . Influenza,inj,Quad PF,36+ Mos 08/01/2015  . PPD Test 07/05/2014  . Pneumococcal Conjugate-13 10/31/2015  . Pneumococcal Polysaccharide-23 08/15/2000, 06/08/2013  . Td 09/14/1998, 02/05/2008  . Zoster 11/06/2013  4. Cervical cancer screening- passed age based screening. Still does gyn exam every 2 years 5. Breast cancer screening-  breast exams with gynecology/breast center and mammogram 04/22/15- discussed 1 vs. 2 year follow up option  6. Colon cancer screening - 12/01/15 with 10 year follow up  7. Skin cancer screening- sees yearly, to reschedule  Status of chronic or acute concerns  HLD- on crestor 10mg  daily for most part- LDL at 72  COPD- on flovent daily with albuterol prn. From 2nd hand smoke  OA- s/p right knee replacement this year . Has mobic on hand if needed  Ua advised today as microscopic not added on. She states goes to gynecology next week and wants to just give urine there- will alert me if any blood (would get microsopic if repeat on  ua positive then consider urology referral)  Scheduling dermatology visit for skin exam and rash on left leg. I advised 7 day trial hydrocortisone BID and to stop scratching area  Return in about 1 year (around 11/06/2017) for physical.  Return precautions advised.   Garret Reddish, MD

## 2016-11-06 NOTE — Patient Instructions (Addendum)
Schedule follow up mammogram and dermatology visit  Now that the knee is doing better- have to get to moving again. I like the bike idea. I would set a goal of at least 10 lbs weight loss over the next 3 months. Diet is even more important than exercise.   No changes in medicine  I would also like for you to sign up for an annual wellness visit with our nurse, Manuela Schwartz, who specializes in the annual wellness exam in 6 months. This will give [place] a chance to check in on weight and keep encouraging you.  This is a free benefit under medicare that may help [place] find additional ways to help you. Some highlights are reviewing medications, lifestyle, and doing a dementia screen.

## 2016-11-06 NOTE — Progress Notes (Signed)
Pre visit review using our clinic review tool, if applicable. No additional management support is needed unless otherwise documented below in the visit note. 

## 2016-11-15 LAB — HM MAMMOGRAPHY

## 2016-11-19 ENCOUNTER — Encounter: Payer: Self-pay | Admitting: Family Medicine

## 2016-11-21 ENCOUNTER — Encounter: Payer: Self-pay | Admitting: Women's Health

## 2016-11-21 ENCOUNTER — Ambulatory Visit (INDEPENDENT_AMBULATORY_CARE_PROVIDER_SITE_OTHER): Payer: Medicare Other | Admitting: Women's Health

## 2016-11-21 VITALS — BP 120/74 | Ht 64.0 in | Wt 224.0 lb

## 2016-11-21 DIAGNOSIS — R319 Hematuria, unspecified: Secondary | ICD-10-CM

## 2016-11-21 DIAGNOSIS — Z01411 Encounter for gynecological examination (general) (routine) with abnormal findings: Secondary | ICD-10-CM

## 2016-11-21 LAB — URINALYSIS W MICROSCOPIC + REFLEX CULTURE
Bilirubin Urine: NEGATIVE
CRYSTALS: NONE SEEN [HPF]
Casts: NONE SEEN [LPF]
Glucose, UA: NEGATIVE
Leukocytes, UA: NEGATIVE
Nitrite: NEGATIVE
PROTEIN: NEGATIVE
Specific Gravity, Urine: 1.025 (ref 1.001–1.035)
YEAST: NONE SEEN [HPF]
pH: 6 (ref 5.0–8.0)

## 2016-11-21 NOTE — Patient Instructions (Signed)

## 2016-11-21 NOTE — Progress Notes (Signed)
FORTUNATA VARY 1946/09/16 [id number]    History:    Presents for breast and pelvic exam with no complaints. Postmenopausal on no HRT with no bleeding. Normal mammogram history. Abnormal Pap many years ago normal for the last 20 years. Primary care manages hypercholesterolemia and COPD. Osteopenia primary care manages DEXA. At annual exam with primary care had +1 blood in urine, requests recheck UA.  Past medical history, past surgical history, family history and social history were all reviewed and documented in the EPIC chart. Retired Careers information officer. Husband history of an MI and prostate cancer doing okay. May 16, 2015 son died suddenly at age 89. Daughter doing well. Also had a granddaughter who died at age 68 in a mass shooting in [place].  ROS:  A ROS was performed and pertinent positives and negatives are included.  Exam:  Vitals:   11/21/16 1049  BP: 120/74  Weight: 224 lb (101.6 kg)  Height: 5\' 4"  (1.626 m)   Body mass index is 38.45 kg/m.   General appearance:  Normal Thyroid:  Symmetrical, normal in size, without palpable masses or nodularity. Respiratory  Auscultation:  Clear without wheezing or rhonchi Cardiovascular  Auscultation:  Regular rate, without rubs, murmurs or gallops  Edema/varicosities:  Not grossly evident Abdominal  Soft,nontender, without masses, guarding or rebound.  Liver/spleen:  No organomegaly noted  Hernia:  None appreciated  Skin  Inspection:  Grossly normal   Breasts: Examined lying and sitting.     Right: Without masses, retractions, discharge or axillary adenopathy.     Left: Without masses, retractions, discharge or axillary adenopathy. Gentitourinary   Inguinal/mons:  Normal without inguinal adenopathy  External genitalia:  Normal  BUS/Urethra/Skene's glands:  Normal  Vagina:  Normal  Cervix:  Normal  Uterus:   normal in size, shape and contour.  Midline and mobile  Adnexa/parametria:     Rt: Without masses or  tenderness.   Lt: Without masses or tenderness.  Anus and perineum: Normal  Digital rectal exam: Normal sphincter tone without palpated masses or tenderness  Assessment/Plan:  71 y.o. MWF G3 P2, one deceased  for stent pelvic exam.  Postmenopausal/no HRT/no bleeding Hypercholesterolemia, COPD-primary care manages labs and meds Obesity History of hematuria  Plan: SBE's, continue annual screening mammogram, calcium rich diet, vitamin D 1000 daily encouraged. Reviewed importance of weightbearing exercise, home safety and fall prevention discussed. DEXA - orthopedist will schedule . Will fax UA results to primary care, trace blood, 3-10 RBCs.  Aware of need to decrease simple sugars and carbs and diet for weight loss.    [place], 12:30 PM 11/21/2016

## 2016-11-22 ENCOUNTER — Other Ambulatory Visit: Payer: Self-pay

## 2016-11-22 DIAGNOSIS — R319 Hematuria, unspecified: Secondary | ICD-10-CM

## 2016-11-22 LAB — URINE CULTURE: Organism ID, Bacteria: NO GROWTH

## 2016-11-22 NOTE — Progress Notes (Signed)
Spoke with patient who is going to have lab done at [facility]

## 2016-11-23 ENCOUNTER — Other Ambulatory Visit: Payer: Self-pay

## 2016-11-23 DIAGNOSIS — R319 Hematuria, unspecified: Secondary | ICD-10-CM

## 2017-03-21 ENCOUNTER — Other Ambulatory Visit: Payer: Self-pay | Admitting: Cardiovascular Disease

## 2017-03-31 ENCOUNTER — Other Ambulatory Visit: Payer: Self-pay | Admitting: Family Medicine

## 2017-04-26 ENCOUNTER — Telehealth: Payer: Self-pay | Admitting: Family Medicine

## 2017-04-26 ENCOUNTER — Ambulatory Visit (INDEPENDENT_AMBULATORY_CARE_PROVIDER_SITE_OTHER): Payer: Medicare Other | Admitting: Obstetrics & Gynecology

## 2017-04-26 VITALS — BP 128/80

## 2017-04-26 DIAGNOSIS — L298 Other pruritus: Secondary | ICD-10-CM

## 2017-04-26 DIAGNOSIS — R311 Benign essential microscopic hematuria: Secondary | ICD-10-CM

## 2017-04-26 DIAGNOSIS — N898 Other specified noninflammatory disorders of vagina: Secondary | ICD-10-CM

## 2017-04-26 DIAGNOSIS — R21 Rash and other nonspecific skin eruption: Secondary | ICD-10-CM

## 2017-04-26 LAB — URINALYSIS W MICROSCOPIC + REFLEX CULTURE
BILIRUBIN URINE: NEGATIVE
CRYSTALS: NONE SEEN [HPF]
Casts: NONE SEEN [LPF]
GLUCOSE, UA: NEGATIVE
Nitrite: NEGATIVE
PH: 5.5 (ref 5.0–8.0)
PROTEIN: NEGATIVE
Specific Gravity, Urine: >1.03 (ref 1.001–1.035)
Yeast: NONE SEEN [HPF]

## 2017-04-26 LAB — WET PREP FOR TRICH, YEAST, CLUE
Clue Cells Wet Prep HPF POC: NONE SEEN
Trich, Wet Prep: NONE SEEN
WBC WET PREP: NONE SEEN
YEAST WET PREP: NONE SEEN

## 2017-04-26 MED ORDER — CLOBETASOL PROPIONATE 0.05 % EX OINT: 1.0000 "application " | TOPICAL_OINTMENT | Freq: Two times a day (BID) | CUTANEOUS | 0 refills | Status: AC

## 2017-04-26 NOTE — Progress Notes (Signed)
    THOMAS RHUDE 1946-07-20 [id number]        71 y.o.  [id number] Married.  From [place].  Maiden name Karie Schwalbe!  RP:  Vaginal/Vulvar itching with discharge  HPI:  C/O vulvar and perianal itching x a few days.  Mild white/clear vaginal d/c.  No recent Antibiotherapy.  Not sexually active.  Mictions normal (Microscopic hematuria, seen by Urology).  No SUI.  BMs wnl.  Past medical history,surgical history, problem list, medications, allergies, family history and social history were all reviewed and documented in the EPIC chart.  Directed ROS with pertinent positives and negatives documented in the history of present illness/assessment and plan.  Exam:  Vitals:   04/26/17 1203  BP: 128/80   General appearance:  Normal   Assessment/Plan:  71 y.o. G2P2002   1. Vaginal itching Wet prep negative.  Will treat vulvar rash with Clobetasol 0.05% ointment.  Probiotic tablet/suppository vaginally every week as needed. - WET PREP FOR [place], YEAST, CLUE  2. Benign essential microscopic hematuria Blood 1+, Leuko Trace.  Will send for U. Culture.  Mild microscopic hematuria.  Seen by Urologist who recommended observation. - Urinalysis with Culture Reflex  3. Vulvar rash Probably contact dermatitis.  Erythematous, irritative rash especially at perineum and perianally.  Keep clean and dry.  Refrain from scratching.  White cotton underwear.  Thin layer of Clobetasol 0.05% ointment to affected area twice a day x 1 week, then wean over second week.  Counseling on above issues >50% x 15 minutes.  Princess Bruins MD, 12:13 PM 04/26/2017

## 2017-04-26 NOTE — Telephone Encounter (Signed)
Called pt to let her know that our health coach will not be in the office on 7/23 and that we need to reschedule her appt.  Left message on cell - home number just rang.

## 2017-04-27 LAB — URINE CULTURE

## 2017-04-27 NOTE — Patient Instructions (Signed)
1. Vaginal itching Wet prep negative.  Will treat vulvar rash with Clobetasol 0.05% ointment.  Probiotic tablet/suppository vaginally every week as needed. - WET PREP FOR [place], YEAST, CLUE  2. Benign essential microscopic hematuria Blood 1+, Leuko Trace.  Will send for U. Culture.  Mild microscopic hematuria.  Seen by Urologist who recommended observation. - Urinalysis with Culture Reflex  3. Vulvar rash Probably contact dermatitis.  Erythematous, irritative rash especially at perineum and perianally.  Keep clean and dry.  Refrain from scratching.  White cotton underwear.  Thin layer of Clobetasol 0.05% ointment to affected area twice a day x 1 week, then wean over second week.  [name], it was a pleasure to meet you today!

## 2017-05-02 ENCOUNTER — Encounter (INDEPENDENT_AMBULATORY_CARE_PROVIDER_SITE_OTHER): Payer: Medicare Other | Admitting: Ophthalmology

## 2017-05-02 ENCOUNTER — Telehealth: Payer: Self-pay | Admitting: Family Medicine

## 2017-05-02 DIAGNOSIS — H43813 Vitreous degeneration, bilateral: Secondary | ICD-10-CM

## 2017-05-02 DIAGNOSIS — H353111 Nonexudative age-related macular degeneration, right eye, early dry stage: Secondary | ICD-10-CM | POA: Diagnosis not present

## 2017-05-02 NOTE — Telephone Encounter (Signed)
2nd attempt to reach patient.  Health coach wont be in the office on 7/23 - pt needs to reschedule.  Home number just rang / voicemail left on cell number.

## 2017-05-05 ENCOUNTER — Other Ambulatory Visit: Payer: Self-pay | Admitting: Family Medicine

## 2017-05-06 ENCOUNTER — Ambulatory Visit: Payer: Medicare Other

## 2017-06-15 ENCOUNTER — Other Ambulatory Visit: Payer: Self-pay | Admitting: Cardiovascular Disease

## 2017-06-24 ENCOUNTER — Other Ambulatory Visit: Payer: Self-pay | Admitting: Cardiovascular Disease

## 2017-06-25 ENCOUNTER — Other Ambulatory Visit: Payer: Self-pay

## 2017-06-25 MED ORDER — ROSUVASTATIN CALCIUM 10 MG PO TABS: 10.0000 mg | ORAL_TABLET | Freq: Every day | ORAL | 0 refills | Status: DC

## 2017-07-02 ENCOUNTER — Other Ambulatory Visit: Payer: Self-pay

## 2017-07-02 MED ORDER — ROSUVASTATIN CALCIUM 10 MG PO TABS: 10.0000 mg | ORAL_TABLET | Freq: Every day | ORAL | 0 refills | Status: DC

## 2017-07-16 ENCOUNTER — Ambulatory Visit (INDEPENDENT_AMBULATORY_CARE_PROVIDER_SITE_OTHER): Payer: Medicare Other

## 2017-07-16 DIAGNOSIS — Z23 Encounter for immunization: Secondary | ICD-10-CM

## 2017-08-08 ENCOUNTER — Telehealth: Payer: Self-pay | Admitting: Cardiovascular Disease

## 2017-08-08 ENCOUNTER — Ambulatory Visit (INDEPENDENT_AMBULATORY_CARE_PROVIDER_SITE_OTHER): Payer: Medicare Other

## 2017-08-08 ENCOUNTER — Ambulatory Visit (INDEPENDENT_AMBULATORY_CARE_PROVIDER_SITE_OTHER): Payer: Medicare Other | Admitting: Cardiovascular Disease

## 2017-08-08 ENCOUNTER — Encounter: Payer: Self-pay | Admitting: Cardiovascular Disease

## 2017-08-08 VITALS — BP 110/70 | HR 67 | Ht 64.5 in | Wt 237.5 lb

## 2017-08-08 DIAGNOSIS — E785 Hyperlipidemia, unspecified: Secondary | ICD-10-CM | POA: Diagnosis not present

## 2017-08-08 DIAGNOSIS — R002 Palpitations: Secondary | ICD-10-CM

## 2017-08-08 NOTE — Telephone Encounter (Signed)
Pt had zio monitor placed today during OV. Confirmed w/patient via phone that she has monitor [id number] as she read this number to me.

## 2017-08-08 NOTE — Patient Instructions (Signed)
Medication Instructions:  Your physician recommends that you continue on your current medications as directed. Please refer to the Current Medication list given to you today.   Labwork: none  Testing/Procedures: Two week Zio monitor   Follow-Up: Your physician wants you to follow-up in: 6 months with Dr. Fletcher Anon.  You will receive a reminder letter in the mail two months in advance. If you don't receive a letter, please call our office to schedule the follow-up appointment.   Any Other Special Instructions Will Be Listed Below (If Applicable).     If you need a refill on your cardiac medications before your next appointment, please call your pharmacy.

## 2017-08-08 NOTE — Progress Notes (Signed)
Cardiology Office Note   Date:  08/08/2017   ID:  Linda, Graves November 23, 1945, MRN [id number]  PCP:  Linda Olp, MD  Cardiologist:   Linda Sacramento, MD   Chief Complaint  Patient presents with  . Other    C/o chest pressure. Meds reviewed verbally with pt.      History of Present Illness: [name] is a 71 y.o. female who presents for a follow-up visit.  She has known history of COPD from second hand smoking, and hyperlipidemia. She does have family history of coronary artery disease but not prematurely. She is a lifelong nonsmoker. She has no history of hypertension or diabetes.  She had a nuclear stress test in 2016 for atypical chest pain and back normal. Recently, she had intermittent episodes of palpitations with associated tachycardia noted and her blood pressure machine.  First incident happened in June in the setting of consuming excessive amount of sodium.  Her heart rate was running around 140 bpm at rest.  The episodes resolved without intervention.  Most recently about 2 weeks ago, she felt palpitations and her heart rate was running anywhere between 130-150 bpm.  She has no chest pain or shortness of breath.  No dizziness or syncope.  Past Medical History:  Diagnosis Date  . Allergy   . ASCUS on Pap smear 2007  . ASTHMA UNSPECIFIED WITH EXACERBATION 11/08/2008  . Cancer of skin of left ear   . Chest pain 03/2011   negative stress test - Dr. Ron Parker  . History of frequent urinary tract infections   . History of kidney stones   . [place], [place] 02/09/2009  . Kidney stones   . OSTEOARTHRITIS 04/24/2007  . Osteopenia   . Osteoporosis   . Pneumonia   . PONV (postoperative nausea and vomiting)   . SHINGLES 11/08/2008   had vaccine at drugstore 12/14  . VARICOSE VEINS LOWER EXTREMITIES W/INFLAMMATION 02/05/2008    Past Surgical History:  Procedure Laterality Date  . Bone spur shoulder     x2 removed  . COLPOSCOPY     Neg HR HPV  . KNEE  ARTHROSCOPY    . laser surgery per left ey     secondary to film covering   . lens implants- both eyes    . SPINE SURGERY  2008   lumbar  . TOTAL KNEE ARTHROPLASTY Right 08/07/2016   Procedure: RIGHT TOTAL KNEE ARTHROPLASTY;  Surgeon: Paralee Cancel, MD;  Location: [facility];  Service: Orthopedics;  Laterality: Right;     Current Outpatient Prescriptions  Medication Sig Dispense Refill  . aspirin 81 MG chewable tablet Chew 1 tablet (81 mg total) by mouth 2 (two) times daily. Take for 4 weeks, then resume regular dose. 60 tablet 0  . Calcium Citrate 250 MG TABS Take 1,000 mg by mouth 2 (two) times daily.    . cetirizine (ZYRTEC ALLERGY) 10 MG tablet Take 10 mg by mouth at bedtime.      . Cholecalciferol (VITAMIN D3) 2000 UNITS TABS Take 2,000 Units by mouth 2 (two) times daily.      . fish oil-omega-3 fatty acids 1000 MG capsule Take 2 capsules (2 g total) by mouth 2 (two) times daily. total for 4,000 mg (Patient taking differently: Take 1 g by mouth daily. )    . FLOVENT HFA 110 MCG/ACT inhaler INHALE TWO PUFFS BY MOUTH TWICE DAILY 12 g 12  . GLUCOSAMINE SULFATE PO Take 2 tablets by mouth 2 (two) times  daily.     . meloxicam (MOBIC) 7.5 MG tablet TAKE ONE TABLET BY MOUTH TWICE DAILY 180 tablet 2  . Multiple Vitamin (MULTIVITAMIN) tablet Take 1 tablet by mouth daily.    Marland Kitchen PROAIR HFA 108 (90 Base) MCG/ACT inhaler INHALE TWO PUFFS INTO LUNGS EVERY 6 HOURS AS NEEDED FOR WHEEZING FOR SHORTNESS OF BREATH 9 each 5  . rosuvastatin (CRESTOR) 10 MG tablet Take 1 tablet (10 mg total) by mouth daily. 30 tablet 0   No current facility-administered medications for this visit.     Allergies:   Atorvastatin and Cefuroxime axetil    Social History:  The patient  reports that she is a non-smoker but has been exposed to tobacco smoke. She has never used smokeless tobacco. She reports that she drinks about 0.6 oz of alcohol per week . She reports that she does not use drugs.   Family History:  The patient's  family history includes Breast cancer in her cousin; Cancer in her maternal grandmother; Diabetes in her paternal aunt; Down syndrome in her sister; Heart disease in her father, maternal grandfather, and paternal grandfather; Hypertension in her father; Melanoma in her mother; Ovarian cancer in her mother; Prostate cancer in her maternal uncle, maternal uncle, and maternal uncle; Sudden Cardiac Death in her son.    ROS:  Please see the history of present illness.   Otherwise, review of systems are positive for none.   All other systems are reviewed and negative.    PHYSICAL EXAM: VS:  BP 110/70 (BP Location: Left Arm, Patient Position: Sitting, Cuff Size: Large)   Pulse 67   Ht 5' 4.5" (1.638 m)   Wt 237 lb 8 oz (107.7 kg)   BMI 40.14 kg/m  , BMI Body mass index is 40.14 kg/m. GEN: Well nourished, well developed, in no acute distress  HEENT: normal  Neck: no JVD, carotid bruits, or masses Cardiac: RRR; no murmurs, rubs, or gallops,no edema  Respiratory:  clear to auscultation bilaterally, normal work of breathing GI: soft, nontender, nondistended, + BS MS: no deformity or atrophy  Skin: warm and dry, no rash Neuro:  Strength and sensation are intact Psych: euthymic mood, full affect   EKG:  EKG is ordered today. The ekg ordered today demonstrates  normal sinus rhythm with low voltage. No significant ST or T wave changes.   Recent Labs: 10/25/2016: ALT 23; BUN 23; Creatinine, Ser 0.71; Hemoglobin 13.7; Platelets 151.0; Potassium 4.6; Sodium 142; TSH 2.41    Lipid Panel    Component Value Date/Time   CHOL 143 10/25/2016 0919   CHOL 221 (H) 06/17/2015 0856   TRIG 61.0 10/25/2016 0919   HDL 59.00 10/25/2016 0919   HDL 67 06/17/2015 0856   CHOLHDL 2 10/25/2016 0919   VLDL 12.2 10/25/2016 0919   LDLCALC 72 10/25/2016 0919   LDLCALC 142 (H) 06/17/2015 0856   LDLDIRECT 149.5 10/24/2012 0831      Wt Readings from Last 3 Encounters:  08/08/17 237 lb 8 oz (107.7 kg)  11/21/16  224 lb (101.6 kg)  11/06/16 234 lb 3.2 oz (106.2 kg)       No flowsheet data found.    ASSESSMENT AND PLAN:  1.   Palpitations: She reports intermittent tachycardia at home measured by a blood pressure machine.  Heart rate to be changing significantly raises possibility of atrial fibrillation.  Things are not happening frequently enough to be captured on a Holter monitor.  Thus, I requested a 2-week outpatient telemetry.  If  any arrhythmias detected, I recommend an echocardiogram.  2. Hyperlipidemia: She resumed taking rosuvastatin with significant improvement in lipid profile with most recent LDL of 66.  Disposition:   FU with me in 6 months.  Signed,  Linda Sacramento, MD  08/08/2017 3:22 PM    [facility]

## 2017-08-15 ENCOUNTER — Other Ambulatory Visit: Payer: Self-pay | Admitting: Cardiovascular Disease

## 2017-08-15 ENCOUNTER — Other Ambulatory Visit: Payer: Self-pay | Admitting: Family Medicine

## 2017-08-22 ENCOUNTER — Telehealth: Payer: Self-pay | Admitting: Cardiovascular Disease

## 2017-08-22 DIAGNOSIS — R002 Palpitations: Secondary | ICD-10-CM | POA: Diagnosis not present

## 2017-08-22 NOTE — Telephone Encounter (Signed)
Pt calling stating on the paper she has on monitor she's wearing  She would like to know when is she to send it back  Would like a call back

## 2017-08-23 NOTE — Telephone Encounter (Signed)
Pt wearing zio patch monitor. End date was November 8. Informed pt she may remove monitor and mail back today.  Pt states she has monitor [id number]. She will mail back today.

## 2017-09-12 ENCOUNTER — Other Ambulatory Visit: Payer: Self-pay

## 2017-09-12 ENCOUNTER — Telehealth: Payer: Self-pay | Admitting: Cardiovascular Disease

## 2017-09-12 MED ORDER — ROSUVASTATIN CALCIUM 10 MG PO TABS: ORAL_TABLET | ORAL | 3 refills | Status: DC

## 2017-09-12 NOTE — Telephone Encounter (Signed)
We are in the process of uploading report to Dr Tyrell Antonio basket today. Informed patient that we will call her as soon as we have the results for her. She was very appreciative and verbalized understanding.

## 2017-09-12 NOTE — Telephone Encounter (Signed)
Pt would like monitor results. Please call. 

## 2017-09-12 NOTE — Telephone Encounter (Signed)
Pt is in [place] and only needs a 10 day supply

## 2017-09-20 ENCOUNTER — Other Ambulatory Visit: Payer: Self-pay

## 2017-09-20 MED ORDER — APIXABAN 5 MG PO TABS: 5.0000 mg | ORAL_TABLET | Freq: Two times a day (BID) | ORAL | 3 refills | Status: DC

## 2017-09-20 MED ORDER — METOPROLOL TARTRATE 25 MG PO TABS: 25.0000 mg | ORAL_TABLET | Freq: Two times a day (BID) | ORAL | 3 refills | Status: DC

## 2017-09-26 ENCOUNTER — Telehealth: Payer: Self-pay | Admitting: Cardiovascular Disease

## 2017-09-26 NOTE — Telephone Encounter (Signed)
Called and s/w patient. She was aware to stop Aspirin and start Eliquis and metoprolol. I advised patient to check with her pharmacist regarding potential interactions with the other medications she takes. She verbalized understanding. We also scheduled her for f/u with Dr Fletcher Anon on 10/24/17 and she was added to waiting list.

## 2017-09-26 NOTE — Telephone Encounter (Signed)
Pt calling asking since now she is going to start taking Eliquis  Will she need to stop any other medications  This is her first heart medication just wants to be sure  Please call back

## 2017-09-27 ENCOUNTER — Telehealth: Payer: Self-pay

## 2017-09-27 NOTE — Telephone Encounter (Signed)
Fax received from [facility] in [place] for Metoprolol Tar 25MG  BID.  Per fax it states Metoprolol Tar 25MG  is currently on back order and asked if the Doctor would like to write an order for Metoprolol 50MG . Please advise.

## 2017-09-27 NOTE — Telephone Encounter (Signed)
S/w Alyse Low, PharmD. She stated they had just got in the 25 mg tablets today from a different manufacturer and will be able to fill prescription as ordered.

## 2017-10-04 ENCOUNTER — Telehealth: Payer: Self-pay | Admitting: Cardiovascular Disease

## 2017-10-04 NOTE — Telephone Encounter (Signed)
Patient is now taking a heart medication and a blood thinner Patient would like know if any of her other medications may cause a reaction with new medication  Please call to discuss  If it is before 3p call her home phone, after 3p call cell

## 2017-10-04 NOTE — Telephone Encounter (Signed)
Based on zio monitor results, eliquis and metoprolol added which she started on 12/13 after arriving home from [place]. Pt asks if it is safe to take new medications with her current meds.  Confirmed pt stopped aspirin when starting eliquis. Reviewed current medications. Pt confirmed list is correct. Advised pt to continue all medications as prescribed as Dr. Fletcher Anon is aware of what she takes. Confirmed January appt.

## 2017-10-21 ENCOUNTER — Encounter: Payer: Self-pay | Admitting: Cardiovascular Disease

## 2017-10-21 ENCOUNTER — Ambulatory Visit: Payer: Medicare Other | Admitting: Cardiovascular Disease

## 2017-10-21 VITALS — BP 104/60 | HR 69 | Ht 64.5 in | Wt 241.0 lb

## 2017-10-21 DIAGNOSIS — E785 Hyperlipidemia, unspecified: Secondary | ICD-10-CM | POA: Diagnosis not present

## 2017-10-21 DIAGNOSIS — I480 Paroxysmal atrial fibrillation: Secondary | ICD-10-CM | POA: Diagnosis not present

## 2017-10-21 NOTE — Patient Instructions (Addendum)
Medication Instructions:  Your physician recommends that you continue on your current medications as directed. Please refer to the Current Medication list given to you today.   Labwork: none  Testing/Procedures: Your physician has requested that you have an echocardiogram. Echocardiography is a painless test that uses sound waves to create images of your heart. It provides your doctor with information about the size and shape of your heart and how well your heart's chambers and valves are working. This procedure takes approximately one hour. There are no restrictions for this procedure.    Follow-Up: Your physician wants you to follow-up in: 6 months with Dr. Arida.  You will receive a reminder letter in the mail two months in advance. If you don't receive a letter, please call our office to schedule the follow-up appointment.   Any Other Special Instructions Will Be Listed Below (If Applicable).     If you need a refill on your cardiac medications before your next appointment, please call your pharmacy.  Echocardiogram An echocardiogram, or echocardiography, uses sound waves (ultrasound) to produce an image of your heart. The echocardiogram is simple, painless, obtained within a short period of time, and offers valuable information to your health care provider. The images from an echocardiogram can provide information such as:  Evidence of coronary artery disease (CAD).  Heart size.  Heart muscle function.  Heart valve function.  Aneurysm detection.  Evidence of a past heart attack.  Fluid buildup around the heart.  Heart muscle thickening.  Assess heart valve function.  Tell a health care provider about:  Any allergies you have.  All medicines you are taking, including vitamins, herbs, eye drops, creams, and over-the-counter medicines.  Any problems you or family members have had with anesthetic medicines.  Any blood disorders you have.  Any surgeries you have  had.  Any medical conditions you have.  Whether you are pregnant or may be pregnant. What happens before the procedure? No special preparation is needed. Eat and drink normally. What happens during the procedure?  In order to produce an image of your heart, gel will be applied to your chest and a wand-like tool (transducer) will be moved over your chest. The gel will help transmit the sound waves from the transducer. The sound waves will harmlessly bounce off your heart to allow the heart images to be captured in real-time motion. These images will then be recorded.  You may need an IV to receive a medicine that improves the quality of the pictures. What happens after the procedure? You may return to your normal schedule including diet, activities, and medicines, unless your health care provider tells you otherwise. This information is not intended to replace advice given to you by your health care provider. Make sure you discuss any questions you have with your health care provider. Document Released: 09/28/2000 Document Revised: 05/19/2016 Document Reviewed: 06/08/2013 Elsevier Interactive Patient Education  2017 Elsevier Inc.  

## 2017-10-21 NOTE — Progress Notes (Signed)
Cardiology Office Note   Date:  10/21/2017   ID:  [name], DOB 1946/08/10, MRN [id number]  PCP:  Marin Olp, MD  Cardiologist:   Kathlyn Sacramento, MD   Chief Complaint  Patient presents with  . other    Follow up from the event monitor. Meds reviewed by the pt. verbally. "doing well."       History of Present Illness: [name] is a 72 y.o. female who presents for a follow-up visit regarding paroxysmal atrial fibrillation.  She has known history of COPD from second hand smoking, and hyperlipidemia. She does have family history of coronary artery disease but not prematurely. She is a lifelong nonsmoker. She has no history of hypertension or diabetes.  She had a nuclear stress test in 2016 for atypical chest pain and back normal. She was seen a few months ago for intermittent palpitations and irregular heartbeats.  She had a 2-week outpatient telemetry which showed intermittent episodes of short SVT as well as atrial fibrillation with rapid ventricular response.  The longest episode of atrial fibrillation was almost 3 hours.  She was started on metoprolol and Eliquis for anticoagulation.  She has been doing well and denies any recurrent palpitations.  No chest pain or shortness of breath. She did have mild dizziness when she first started taking metoprolol no recent symptoms.   Past Medical History:  Diagnosis Date  . Allergy   . ASCUS on Pap smear 2007  . ASTHMA UNSPECIFIED WITH EXACERBATION 11/08/2008  . Cancer of skin of left ear   . Chest pain 03/2011   negative stress test - Dr. Ron Parker  . History of frequent urinary tract infections   . History of kidney stones   . [place], [place] 02/09/2009  . Kidney stones   . OSTEOARTHRITIS 04/24/2007  . Osteopenia   . Osteoporosis   . Pneumonia   . PONV (postoperative nausea and vomiting)   . SHINGLES 11/08/2008   had vaccine at drugstore 12/14  . VARICOSE VEINS LOWER EXTREMITIES W/INFLAMMATION 02/05/2008     Past Surgical History:  Procedure Laterality Date  . Bone spur shoulder     x2 removed  . COLPOSCOPY     Neg HR HPV  . KNEE ARTHROSCOPY    . laser surgery per left ey     secondary to film covering   . lens implants- both eyes    . SPINE SURGERY  2008   lumbar  . TOTAL KNEE ARTHROPLASTY Right 08/07/2016   Procedure: RIGHT TOTAL KNEE ARTHROPLASTY;  Surgeon: Paralee Cancel, MD;  Location: [facility];  Service: Orthopedics;  Laterality: Right;     Current Outpatient Medications  Medication Sig Dispense Refill  . apixaban (ELIQUIS) 5 MG TABS tablet Take 1 tablet (5 mg total) by mouth 2 (two) times daily. 60 tablet 3  . Calcium Citrate 250 MG TABS Take 1,000 mg by mouth 2 (two) times daily.    . cetirizine (ZYRTEC ALLERGY) 10 MG tablet Take 10 mg by mouth at bedtime.      . Cholecalciferol (VITAMIN D3) 2000 UNITS TABS Take 2,000 Units by mouth 2 (two) times daily.      . fish oil-omega-3 fatty acids 1000 MG capsule Take 2 capsules (2 g total) by mouth 2 (two) times daily. total for 4,000 mg (Patient taking differently: Take 1 g by mouth daily. )    . FLOVENT HFA 110 MCG/ACT inhaler INHALE TWO PUFFS BY MOUTH TWICE DAILY 12 g 12  .  GLUCOSAMINE SULFATE PO Take 2 tablets by mouth 2 (two) times daily.     . meloxicam (MOBIC) 7.5 MG tablet TAKE ONE TABLET BY MOUTH TWICE DAILY 180 tablet 2  . metoprolol tartrate (LOPRESSOR) 25 MG tablet Take 1 tablet (25 mg total) by mouth 2 (two) times daily. 60 tablet 3  . Multiple Vitamin (MULTIVITAMIN) tablet Take 1 tablet by mouth daily.    Marland Kitchen PROAIR HFA 108 (90 Base) MCG/ACT inhaler INHALE TWO PUFFS INTO LUNGS EVERY 6 HOURS AS NEEDED FOR WHEEZING FOR SHORTNESS OF BREATH 9 each 5  . rosuvastatin (CRESTOR) 10 MG tablet TAKE 1 TABLET BY MOUTH ONCE DAILY(PT NEEDS APPT BEFORE FUTURE REFILLS) 30 tablet 3   No current facility-administered medications for this visit.     Allergies:   Atorvastatin and Cefuroxime axetil    Social History:  The patient  reports  that she is a non-smoker but has been exposed to tobacco smoke. she has never used smokeless tobacco. She reports that she drinks about 0.6 oz of alcohol per week. She reports that she does not use drugs.   Family History:  The patient's family history includes Breast cancer in her cousin; Cancer in her maternal grandmother; Diabetes in her paternal aunt; Down syndrome in her sister; Heart disease in her father, maternal grandfather, and paternal grandfather; Hypertension in her father; Melanoma in her mother; Ovarian cancer in her mother; Prostate cancer in her maternal uncle, maternal uncle, and maternal uncle; Sudden Cardiac Death in her son.    ROS:  Please see the history of present illness.   Otherwise, review of systems are positive for none.   All other systems are reviewed and negative.    PHYSICAL EXAM: VS:  BP 104/60 (BP Location: Left Arm, Patient Position: Sitting, Cuff Size: Large)   Pulse 69   Ht 5' 4.5" (1.638 m)   Wt 241 lb (109.3 kg)   BMI 40.73 kg/m  , BMI Body mass index is 40.73 kg/m. GEN: Well nourished, well developed, in no acute distress  HEENT: normal  Neck: no JVD, carotid bruits, or masses Cardiac: RRR; no murmurs, rubs, or gallops,no edema  Respiratory:  clear to auscultation bilaterally, normal work of breathing GI: soft, nontender, nondistended, + BS MS: no deformity or atrophy  Skin: warm and dry, no rash Neuro:  Strength and sensation are intact Psych: euthymic mood, full affect   EKG:  EKG is ordered today. The ekg ordered today demonstrates  normal sinus rhythm with low voltage. No significant ST or T wave changes.   Recent Labs: 10/25/2016: ALT 23; BUN 23; Creatinine, Ser 0.71; Hemoglobin 13.7; Platelets 151.0; Potassium 4.6; Sodium 142; TSH 2.41    Lipid Panel    Component Value Date/Time   CHOL 143 10/25/2016 0919   CHOL 221 (H) 06/17/2015 0856   TRIG 61.0 10/25/2016 0919   HDL 59.00 10/25/2016 0919   HDL 67 06/17/2015 0856   CHOLHDL 2  10/25/2016 0919   VLDL 12.2 10/25/2016 0919   LDLCALC 72 10/25/2016 0919   LDLCALC 142 (H) 06/17/2015 0856   LDLDIRECT 149.5 10/24/2012 0831      Wt Readings from Last 3 Encounters:  10/21/17 241 lb (109.3 kg)  08/08/17 237 lb 8 oz (107.7 kg)  11/21/16 224 lb (101.6 kg)       No flowsheet data found.    ASSESSMENT AND PLAN:  1.   Paroxysmal atrial fibrillation: No recurrent symptoms since he was started on small dose of metoprolol.  She  is currently in sinus rhythm.  She is tolerating anticoagulation with Eliquis with no bleeding complications.   Chads vas score: 2.  I discussed the rationale for long-term anticoagulation in order to decrease cardioembolic complications. She is going for a physical later this month and will get labs done.  I recommend checking CBC, basic metabolic profile thyroid function.  2. Hyperlipidemia: She resumed taking rosuvastatin with significant improvement in lipid profile with most recent LDL of 66.  Disposition:   FU with me in 6 months.  Signed,  Kathlyn Sacramento, MD  10/21/2017 2:38 PM    [place]

## 2017-10-24 ENCOUNTER — Ambulatory Visit: Payer: Medicare Other | Admitting: Cardiovascular Disease

## 2017-10-28 ENCOUNTER — Other Ambulatory Visit: Payer: Medicare Other

## 2017-10-30 NOTE — Progress Notes (Signed)
Subjective:   [name] is a 72 y.o. female who presents for an Initial Medicare Annual Wellness Visit.  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   Colonoscopy 11/2015 - probably want need another Mammogram 11/2016 dexa 2012  -1.9 - ortho was following She will check with Elon Alas regarding a repeat bone density  Does see GYN Dr. Phineas Real who follows mammogram and dexa        Objective:    Today's Vitals   10/31/17 0808  BP: 118/68  Pulse: 76  SpO2: 96%  Weight: 238 lb (108 kg)  Height: 5' 4.5" (1.638 m)   Body mass index is 40.22 kg/m.  Advanced Directives 10/31/2017 08/07/2016 08/07/2016 07/31/2016 12/01/2015  Does Patient Have a Medical Advance Directive? No No No No No  Would patient like information on creating a medical advance directive? - No - patient declined information No - patient declined information No - patient declined information -   Educated with [place] AD form and agreed to take home and review  Current Medications (verified) Outpatient Encounter Medications as of 10/31/2017  Medication Sig  . apixaban (ELIQUIS) 5 MG TABS tablet Take 1 tablet (5 mg total) by mouth 2 (two) times daily.  . Calcium Citrate 250 MG TABS Take 1,000 mg by mouth 2 (two) times daily.  . cetirizine (ZYRTEC ALLERGY)  10 MG tablet Take 10 mg by mouth at bedtime.    . Cholecalciferol (VITAMIN D3) 2000 UNITS TABS Take 2,000 Units by mouth 2 (two) times daily.    . fish oil-omega-3 fatty acids 1000 MG capsule Take 2 capsules (2 g total) by mouth 2 (two) times daily. total for 4,000 mg (Patient taking differently: Take 1 g by mouth daily. )  . FLOVENT HFA 110 MCG/ACT inhaler INHALE TWO PUFFS BY MOUTH TWICE DAILY  . GLUCOSAMINE SULFATE PO Take 2 tablets by mouth 2 (two) times daily.   . meloxicam (MOBIC) 7.5 MG tablet TAKE ONE TABLET BY MOUTH TWICE DAILY  . metoprolol tartrate (LOPRESSOR) 25 MG tablet Take 1 tablet (25 mg total) by mouth 2 (two) times daily.  . Multiple Vitamin (MULTIVITAMIN) tablet Take 1 tablet by mouth daily.  Marland Kitchen PROAIR HFA 108 (90 Base) MCG/ACT inhaler INHALE TWO PUFFS INTO LUNGS EVERY 6 HOURS AS NEEDED FOR WHEEZING FOR SHORTNESS OF BREATH  . rosuvastatin (CRESTOR) 10 MG tablet TAKE 1 TABLET BY MOUTH ONCE DAILY(PT NEEDS APPT BEFORE FUTURE REFILLS)   No facility-administered encounter medications on file as of 10/31/2017.     Allergies (verified) Atorvastatin and Cefuroxime axetil   History: Past Medical History:  Diagnosis Date  . Allergy   . ASCUS on Pap smear 2007  . ASTHMA  UNSPECIFIED WITH EXACERBATION 11/08/2008  . Cancer of skin of left ear   . Chest pain 03/2011   negative stress test - Dr. Ron Parker  . History of frequent urinary tract infections   . History of kidney stones   . [place], [place] 02/09/2009  . Kidney stones   . OSTEOARTHRITIS 04/24/2007  . Osteopenia   . Osteoporosis   . Pneumonia   . PONV (postoperative nausea and vomiting)   . SHINGLES 11/08/2008   had vaccine at drugstore 12/14  . VARICOSE VEINS LOWER EXTREMITIES W/INFLAMMATION 02/05/2008   Past Surgical History:  Procedure Laterality Date  . Bone spur shoulder     x2 removed  . COLPOSCOPY     Neg HR HPV  . KNEE ARTHROSCOPY    . laser surgery per left ey     secondary to film covering   . lens  implants- both eyes    . SPINE SURGERY  2008   lumbar  . TOTAL KNEE ARTHROPLASTY Right 08/07/2016   Procedure: RIGHT TOTAL KNEE ARTHROPLASTY;  Surgeon: Paralee Cancel, MD;  Location: [facility];  Service: Orthopedics;  Laterality: Right;   Family History  Problem Relation Age of Onset  . Ovarian cancer Mother   . Melanoma Mother   . Hypertension Father   . Heart disease Father        MI 60  . Heart disease Paternal Grandfather   . Cancer Maternal Grandmother        Oral cancer  . Down syndrome Sister   . Prostate cancer Maternal Uncle   . Heart disease Maternal Grandfather   . Prostate cancer Maternal Uncle   . Prostate cancer Maternal Uncle   . Breast cancer Cousin        two maternal cousins diagnosed <50  . Diabetes Paternal Aunt   . Sudden Cardiac Death Son        unclear cause.   . Colon cancer Neg Hx   . Esophageal cancer Neg Hx   . Rectal cancer Neg Hx   . Stomach cancer Neg Hx    Social History   Socioeconomic History  . Marital status: Married    Spouse name: None  . Number of children: 2  . Years of education: None  . Highest education level: None  Social Needs  . Financial resource strain: None  . Food insecurity - worry: None  . Food insecurity - inability: None  . Transportation needs - medical: None  . Transportation needs - non-medical: None  Occupational History  . Occupation: Retired  Tobacco Use  . Smoking status: Passive Smoke Exposure - Never Smoker  . Smokeless tobacco: Never Used  . Tobacco comment: last exposure 1999  Substance and Sexual Activity  . Alcohol use: Yes    Alcohol/week: 0.6 oz    Types: 1 Standard drinks or equivalent per week    Comment: monthly  . Drug use: No  . Sexual activity: Yes    Birth control/protection: Post-menopausal  Other Topics Concern  . None  Social History Narrative   Married 44 years in 2015. 2 kids. 2 grandkids.       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 N  Vision? N  Difficulty concentrating or making decisions? N  Walking or climbing stairs? N  Dressing or bathing? N  Doing errands, shopping? N  Preparing Food and eating ? N  Using the Toilet? N  In the past six months, have you accidently leaked urine? N  Do you have problems with loss of bowel control? N  Managing your Medications? N  Managing your Finances? N  Housekeeping or managing your Housekeeping? N  Some recent data might be hidden     Immunizations and Health Maintenance Immunization History  Administered Date(s) Administered  . Influenza Whole 08/15/2000, 08/06/2008  . Influenza, High Dose Seasonal PF 10/25/2014, 07/16/2016, 07/16/2017  . Influenza,inj,Quad PF,6+ Mos 08/01/2015  . PPD Test 07/05/2014  . Pneumococcal Conjugate-13 10/31/2015  . Pneumococcal Polysaccharide-23 08/15/2000, 06/08/2013  . Td 09/14/1998, 02/05/2008  . Zoster 11/06/2013   There are no preventive care reminders to display for this patient.  Patient Care Team: Marin Olp, MD as PCP - General (Family Medicine)  Indicate any recent Medical Services you may have received from other than [facility] providers in the past year (date may be approximate).     Assessment:   This is a routine wellness examination for Harveen.  Hearing/Vision screen Hearing Screening Comments: Hearing issues  None to be noted  Vision Screening Comments: Vision checked x 1 per year [facility] [address]  Had lens implants Has seen Dr. Zigmund Daniel and he removed a film  Dietary issues and exercise activities discussed:  Current Exercise  Habits: Structured exercise class, Type of exercise: Other - see comments, Time (Minutes): 60, Frequency (Times/Week): 3, Weekly Exercise (Minutes/Week): 180, Intensity: Moderate  Goals    . Weight (lb) < 200 lb (90.7 kg)     Stop eating bread and white sugar  Paleo x 3 to 4 days days to wean from sugar  Keep drinking your water !  May try to watch calories x 7 days and weight every day You can use myfitnesspal or calorie king.[email or web address]         Depression Screen PHQ 2/9 Scores 10/31/2017 11/06/2016 10/31/2015 07/01/2012  PHQ - 2 Score 0 0 0 1    Fall Risk Fall Risk  10/31/2017 11/06/2016 10/31/2015  Falls in the past year? No No No       Ad8 score reviewed for issues:  Issues making decisions:  Less interest in hobbies / activities:  Repeats questions, stories (family complaining):  Trouble using ordinary gadgets (microwave, computer, phone):  Forgets the month or year:   Mismanaging finances:   Remembering appts:  Daily problems with thinking and/or memory: Ad8 score is=0     MMSE - Mini Mental State Exam 10/31/2017  Not completed: (No Data)    Waived due to no current issues and Ad8 score 0     Screening Tests Health Maintenance  Topic Date Due  . TETANUS/TDAP  02/04/2018  . MAMMOGRAM  11/15/2018  . COLONOSCOPY  11/30/2025  . INFLUENZA VACCINE  Completed  . DEXA SCAN  Completed  . Hepatitis C Screening  Completed  . PNA vac Low Risk Adult  Completed    Qualifies for Shingles Vaccine - educated       Plan:      PCP Notes   Health Maintenance There are no preventive care reminders to display for this patient.  Discussed  weight reduction and methods Reviewed myfitnesspal app and challenged to weight in every day x 7 and keep track of food every day for calorie consumption. May not see weight loss but can cut out calories from diet; depending on the result, I.e. Fat or other.  Agreed to try  Agrees to stop eating extra sugar which has worked for  her in the past.  Educated regarding shingrix  Will defer bone density to GYN; seen by Elon Alas;  The patient will discuss repeat   Abnormal Screens  BMI 40 again, she has lost weight in the past and did not recommend referrals based on motivation to lose at this time    Referrals  none  Patient concerns;  as noted   Nurse Concerns; As noted   Next PCP apt  12/11/2017     I have personally reviewed and noted the following in the patient's chart:   . Medical and social history . Use of alcohol, tobacco or illicit drugs  . Current medications and supplements . Functional ability and status . Nutritional status . Physical activity . Advanced directives . List of other physicians . Hospitalizations, surgeries, and ER visits in previous 12 months . Vitals . Screenings to include cognitive, depression, and falls . Referrals and appointments  In addition, I have reviewed and discussed with patient certain preventive protocols, quality metrics, and best practice recommendations. A written personalized care plan for preventive services as well as general preventive health recommendations were provided to patient.     Wynetta Fines, RN   10/31/2017

## 2017-10-31 ENCOUNTER — Other Ambulatory Visit: Payer: Self-pay

## 2017-10-31 ENCOUNTER — Ambulatory Visit (INDEPENDENT_AMBULATORY_CARE_PROVIDER_SITE_OTHER): Payer: Medicare Other | Admitting: *Deleted

## 2017-10-31 ENCOUNTER — Encounter: Payer: Self-pay | Admitting: *Deleted

## 2017-10-31 ENCOUNTER — Ambulatory Visit (INDEPENDENT_AMBULATORY_CARE_PROVIDER_SITE_OTHER): Payer: Medicare Other

## 2017-10-31 ENCOUNTER — Other Ambulatory Visit (INDEPENDENT_AMBULATORY_CARE_PROVIDER_SITE_OTHER): Payer: Medicare Other

## 2017-10-31 VITALS — BP 118/68 | HR 76 | Ht 64.5 in | Wt 238.0 lb

## 2017-10-31 DIAGNOSIS — E785 Hyperlipidemia, unspecified: Secondary | ICD-10-CM

## 2017-10-31 DIAGNOSIS — R319 Hematuria, unspecified: Secondary | ICD-10-CM | POA: Diagnosis not present

## 2017-10-31 DIAGNOSIS — I480 Paroxysmal atrial fibrillation: Secondary | ICD-10-CM

## 2017-10-31 DIAGNOSIS — Z0000 Encounter for general adult medical examination without abnormal findings: Secondary | ICD-10-CM

## 2017-10-31 LAB — COMPREHENSIVE METABOLIC PANEL
ALBUMIN: 4.2 g/dL (ref 3.5–5.2)
ALK PHOS: 101 U/L (ref 39–117)
ALT: 14 U/L (ref 0–35)
AST: 17 U/L (ref 0–37)
BILIRUBIN TOTAL: 0.8 mg/dL (ref 0.2–1.2)
BUN: 27 mg/dL — ABNORMAL HIGH (ref 6–23)
CALCIUM: 9.4 mg/dL (ref 8.4–10.5)
CO2: 31 meq/L (ref 19–32)
Chloride: 103 mEq/L (ref 96–112)
Creatinine, Ser: 0.68 mg/dL (ref 0.40–1.20)
GFR: 90.68 mL/min (ref >60.00)
Glucose, Bld: 99 mg/dL (ref 70–99)
Potassium: 4.8 mEq/L (ref 3.5–5.1)
Sodium: 140 mEq/L (ref 135–145)
TOTAL PROTEIN: 6.7 g/dL (ref 6.0–8.3)

## 2017-10-31 LAB — ECHOCARDIOGRAM COMPLETE
Height: 64.5 in
Weight: 3808 oz

## 2017-10-31 LAB — URINALYSIS, MICROSCOPIC ONLY

## 2017-10-31 LAB — LIPID PANEL
CHOL/HDL RATIO: 3
CHOLESTEROL: 150 mg/dL (ref 0–200)
HDL: 57 mg/dL (ref >39.00)
LDL Cholesterol: 79 mg/dL (ref 0–99)
NonHDL: 92.66
TRIGLYCERIDES: 69 mg/dL (ref 0.0–149.0)
VLDL: 13.8 mg/dL (ref 0.0–40.0)

## 2017-10-31 LAB — CBC
HEMATOCRIT: 42.6 % (ref 36.0–46.0)
Hemoglobin: 13.9 g/dL (ref 12.0–15.0)
MCHC: 32.7 g/dL (ref 30.0–36.0)
MCV: 97.1 fl (ref 78.0–100.0)
PLATELETS: 159 10*3/uL (ref 150.0–400.0)
RBC: 4.38 Mil/uL (ref 3.87–5.11)
RDW: 13.5 % (ref 11.5–15.5)
WBC: 5 10*3/uL (ref 4.0–10.5)

## 2017-10-31 NOTE — Progress Notes (Signed)
I have reviewed and agree with note, evaluation, plan. Thankful for weight loss counseling.   Garret Reddish, MD

## 2017-10-31 NOTE — Patient Instructions (Addendum)
[name] , Thank you for taking time to come for your Medicare Wellness Visit. I appreciate your ongoing commitment to your health goals. Please review the following plan we discussed and let me know if I can assist you in the future.   Shingrix is a vaccine for the prevention of Shingles in Adults 50 and older.  If you are on Medicare, you can request a prescription from your doctor to be filled at a pharmacy.  Please check with your benefits regarding applicable copays or out of pocket expenses.  The Shingrix is given in 2 vaccines approx 8 weeks apart. You must receive the 2nd dose prior to 6 months from receipt of the first.    Will discuss with Dr. Phineas Real need for ovaries due to mother's history   Check with Elon Alas regarding bone density (-1.9)  I would recommend you have one to see if there are any changes   Recommendations for Dexa Scan Female over the age of 43 Man age 25 or older If you broke a bone past the age of 75 Women menopausal age with risk factors (thin frame; smoker; hx of fx ) Post menopausal women under the age of 77 with risk factors A man age 42 to 2 with risk factors Other: Spine xray that is showing break of bone loss Back pain with possible break Height loss of 1/2 inch or more within one year Total loss in height of 1.5 inches from your original height  Calcium 1232m with Vit D 800u per day; more as directed by physician Strength building exercises discussed; can include walking; housework; small weights or stretch bands; silver sneakers if access to the [facility]  Please visit the osteoporosis foundation.org for up to date recommendations  Interested in clinical trails  Try the gov't site; www.clincialtrials.gov Or the  Alzheimer's association online site Or  [employer]   These are the goals we discussed: Goals    . Weight (lb) < 200 lb (90.7 kg)     Stop eating bread and white sugar  Paleo x 3 to 4 days days to wean from  sugar  Keep drinking your water !  May try to watch calories x 7 days and weight every day You can use myfitnesspal or calorie king.com          This is a list of the screening recommended for you and due dates:  Health Maintenance  Topic Date Due  . Tetanus Vaccine  02/04/2018  . Mammogram  11/15/2018  . Colon Cancer Screening  11/30/2025  . Flu Shot  Completed  . DEXA scan (bone density measurement)  Completed  .  Hepatitis C: One time screening is recommended by Center for Disease Control  (CDC) for  adults born from [birth year]through 1965.   Completed  . Pneumonia vaccines  Completed       Fall Prevention in the Home Falls can cause injuries. They can happen to people of all ages. There are many things you can do to make your home safe and to help prevent falls. What can I do on the outside of my home?  Regularly fix the edges of walkways and driveways and fix any cracks.  Remove anything that might make you trip as you walk through a door, such as a raised step or threshold.  Trim any bushes or trees on the path to your home.  Use bright outdoor lighting.  Clear any walking paths of anything that might make  someone trip, such as rocks or tools.  Regularly check to see if handrails are loose or broken. Make sure that both sides of any steps have handrails.  Any raised decks and porches should have guardrails on the edges.  Have any leaves, snow, or ice cleared regularly.  Use sand or salt on walking paths during winter.  Clean up any spills in your garage right away. This includes oil or grease spills. What can I do in the bathroom?  Use night lights.  Install grab bars by the toilet and in the tub and shower. Do not use towel bars as grab bars.  Use non-skid mats or decals in the tub or shower.  If you need to sit down in the shower, use a plastic, non-slip stool.  Keep the floor dry. Clean up any water that spills on the floor as soon as it  happens.  Remove soap buildup in the tub or shower regularly.  Attach bath mats securely with double-sided non-slip rug tape.  Do not have throw rugs and other things on the floor that can make you trip. What can I do in the bedroom?  Use night lights.  Make sure that you have a light by your bed that is easy to reach.  Do not use any sheets or blankets that are too big for your bed. They should not hang down onto the floor.  Have a firm chair that has side arms. You can use this for support while you get dressed.  Do not have throw rugs and other things on the floor that can make you trip. What can I do in the kitchen?  Clean up any spills right away.  Avoid walking on wet floors.  Keep items that you use a lot in easy-to-reach places.  If you need to reach something above you, use a strong step stool that has a grab bar.  Keep electrical cords out of the way.  Do not use floor polish or wax that makes floors slippery. If you must use wax, use non-skid floor wax.  Do not have throw rugs and other things on the floor that can make you trip. What can I do with my stairs?  Do not leave any items on the stairs.  Make sure that there are handrails on both sides of the stairs and use them. Fix handrails that are broken or loose. Make sure that handrails are as long as the stairways.  Check any carpeting to make sure that it is firmly attached to the stairs. Fix any carpet that is loose or worn.  Avoid having throw rugs at the top or bottom of the stairs. If you do have throw rugs, attach them to the floor with carpet tape.  Make sure that you have a light switch at the top of the stairs and the bottom of the stairs. If you do not have them, ask someone to add them for you. What else can I do to help prevent falls?  Wear shoes that: ? Do not have high heels. ? Have rubber bottoms. ? Are comfortable and fit you well. ? Are closed at the toe. Do not wear sandals.  If you  use a stepladder: ? Make sure that it is fully opened. Do not climb a closed stepladder. ? Make sure that both sides of the stepladder are locked into place. ? Ask someone to hold it for you, if possible.  Clearly mark and make sure that you can see: ?  Any grab bars or handrails. ? First and last steps. ? Where the edge of each step is.  Use tools that help you move around (mobility aids) if they are needed. These include: ? Canes. ? Walkers. ? Scooters. ? Crutches.  Turn on the lights when you go into a dark area. Replace any light bulbs as soon as they burn out.  Set up your furniture so you have a clear path. Avoid moving your furniture around.  If any of your floors are uneven, fix them.  If there are any pets around you, be aware of where they are.  Review your medicines with your doctor. Some medicines can make you feel dizzy. This can increase your chance of falling. Ask your doctor what other things that you can do to help prevent falls. This information is not intended to replace advice given to you by your health care provider. Make sure you discuss any questions you have with your health care provider. Document Released: 07/28/2009 Document Revised: 03/08/2016 Document Reviewed: 11/05/2014 Elsevier Interactive Patient Education  2018 [place] Maintenance, Female Adopting a healthy lifestyle and getting preventive care can go a long way to promote health and wellness. Talk with your health care provider about what schedule of regular examinations is right for you. This is a good chance for you to check in with your provider about disease prevention and staying healthy. In between checkups, there are plenty of things you can do on your own. Experts have done a lot of research about which lifestyle changes and preventive measures are most likely to keep you healthy. Ask your health care provider for more information. Weight and diet Eat a healthy diet  Be  sure to include plenty of vegetables, fruits, low-fat dairy products, and lean protein.  Do not eat a lot of foods high in solid fats, added sugars, or salt.  Get regular exercise. This is one of the most important things you can do for your health. ? Most adults should exercise for at least 150 minutes each week. The exercise should increase your heart rate and make you sweat (moderate-intensity exercise). ? Most adults should also do strengthening exercises at least twice a week. This is in addition to the moderate-intensity exercise.  Maintain a healthy weight  Body mass index (BMI) is a measurement that can be used to identify possible weight problems. It estimates body fat based on height and weight. Your health care provider can help determine your BMI and help you achieve or maintain a healthy weight.  For females 64 years of age and older: ? A BMI below 18.5 is considered underweight. ? A BMI of 18.5 to 24.9 is normal. ? A BMI of 25 to 29.9 is considered overweight. ? A BMI of 30 and above is considered obese.  Watch levels of cholesterol and blood lipids  You should start having your blood tested for lipids and cholesterol at 71 years of age, then have this test every 5 years.  You may need to have your cholesterol levels checked more often if: ? Your lipid or cholesterol levels are high. ? You are older than 71 years of age. ? You are at high risk for heart disease.  Cancer screening Lung Cancer  Lung cancer screening is recommended for adults 57-36 years old who are at high risk for lung cancer because of a history of smoking.  A yearly low-dose CT scan of the lungs is recommended for people who: ? Currently  smoke. ? Have quit within the past 15 years. ? Have at least a 30-pack-year history of smoking. A pack year is smoking an average of one pack of cigarettes a day for 1 year.  Yearly screening should continue until it has been 15 years since you quit.  Yearly  screening should stop if you develop a health problem that would prevent you from having lung cancer treatment.  Breast Cancer  Practice breast self-awareness. This means understanding how your breasts normally appear and feel.  It also means doing regular breast self-exams. Let your health care provider know about any changes, no matter how small.  If you are in your 20s or 30s, you should have a clinical breast exam (CBE) by a health care provider every 1-3 years as part of a regular health exam.  If you are 29 or older, have a CBE every year. Also consider having a breast X-ray (mammogram) every year.  If you have a family history of breast cancer, talk to your health care provider about genetic screening.  If you are at high risk for breast cancer, talk to your health care provider about having an MRI and a mammogram every year.  Breast cancer gene (BRCA) assessment is recommended for women who have family members with BRCA-related cancers. BRCA-related cancers include: ? Breast. ? Ovarian. ? Tubal. ? Peritoneal cancers.  Results of the assessment will determine the need for genetic counseling and BRCA1 and BRCA2 testing.  Cervical Cancer Your health care provider may recommend that you be screened regularly for cancer of the pelvic organs (ovaries, uterus, and vagina). This screening involves a pelvic examination, including checking for microscopic changes to the surface of your cervix (Pap test). You may be encouraged to have this screening done every 3 years, beginning at age 13.  For women ages 13-65, health care providers may recommend pelvic exams and Pap testing every 3 years, or they may recommend the Pap and pelvic exam, combined with testing for human papilloma virus (HPV), every 5 years. Some types of HPV increase your risk of cervical cancer. Testing for HPV may also be done on women of any age with unclear Pap test results.  Other health care providers may not recommend  any screening for nonpregnant women who are considered low risk for pelvic cancer and who do not have symptoms. Ask your health care provider if a screening pelvic exam is right for you.  If you have had past treatment for cervical cancer or a condition that could lead to cancer, you need Pap tests and screening for cancer for at least 20 years after your treatment. If Pap tests have been discontinued, your risk factors (such as having a new sexual partner) need to be reassessed to determine if screening should resume. Some women have medical problems that increase the chance of getting cervical cancer. In these cases, your health care provider may recommend more frequent screening and Pap tests.  Colorectal Cancer  This type of cancer can be detected and often prevented.  Routine colorectal cancer screening usually begins at 71 years of age and continues through 70 years of age.  Your health care provider may recommend screening at an earlier age if you have risk factors for colon cancer.  Your health care provider may also recommend using home test kits to check for hidden blood in the stool.  A small camera at the end of a tube can be used to examine your colon directly (sigmoidoscopy or colonoscopy). This  is done to check for the earliest forms of colorectal cancer.  Routine screening usually begins at age 31.  Direct examination of the colon should be repeated every 5-10 years through 71 years of age. However, you may need to be screened more often if early forms of precancerous polyps or small growths are found.  Skin Cancer  Check your skin from head to toe regularly.  Tell your health care provider about any new moles or changes in moles, especially if there is a change in a mole's shape or color.  Also tell your health care provider if you have a mole that is larger than the size of a pencil eraser.  Always use sunscreen. Apply sunscreen liberally and repeatedly throughout the  day.  Protect yourself by wearing long sleeves, pants, a wide-brimmed hat, and sunglasses whenever you are outside.  Heart disease, diabetes, and high blood pressure  High blood pressure causes heart disease and increases the risk of stroke. High blood pressure is more likely to develop in: ? People who have blood pressure in the high end of the normal range (130-139/85-89 mm Hg). ? People who are overweight or obese. ? People who are African American.  If you are 44-31 years of age, have your blood pressure checked every 3-5 years. If you are 82 years of age or older, have your blood pressure checked every year. You should have your blood pressure measured twice-once when you are at a hospital or clinic, and once when you are not at a hospital or clinic. Record the average of the two measurements. To check your blood pressure when you are not at a hospital or clinic, you can use: ? An automated blood pressure machine at a pharmacy. ? A home blood pressure monitor.  If you are between 64 years and 81 years old, ask your health care provider if you should take aspirin to prevent strokes.  Have regular diabetes screenings. This involves taking a blood sample to check your fasting blood sugar level. ? If you are at a normal weight and have a low risk for diabetes, have this test once every three years after 71 years of age. ? If you are overweight and have a high risk for diabetes, consider being tested at a younger age or more often. Preventing infection Hepatitis B  If you have a higher risk for hepatitis B, you should be screened for this virus. You are considered at high risk for hepatitis B if: ? You were born in a country where hepatitis B is common. Ask your health care provider which countries are considered high risk. ? Your parents were born in a high-risk country, and you have not been immunized against hepatitis B (hepatitis B vaccine). ? You have HIV or AIDS. ? You use needles to  inject street drugs. ? You live with someone who has hepatitis B. ? You have had sex with someone who has hepatitis B. ? You get hemodialysis treatment. ? You take certain medicines for conditions, including cancer, organ transplantation, and autoimmune conditions.  Hepatitis C  Blood testing is recommended for: ? Everyone born from [birth year] through 1964. ? Anyone with known risk factors for hepatitis C.  Sexually transmitted infections (STIs)  You should be screened for sexually transmitted infections (STIs) including gonorrhea and chlamydia if: ? You are sexually active and are younger than 71 years of age. ? You are older than 71 years of age and your health care provider tells you that you  are at risk for this type of infection. ? Your sexual activity has changed since you were last screened and you are at an increased risk for chlamydia or gonorrhea. Ask your health care provider if you are at risk.  If you do not have HIV, but are at risk, it may be recommended that you take a prescription medicine daily to prevent HIV infection. This is called pre-exposure prophylaxis (PrEP). You are considered at risk if: ? You are sexually active and do not regularly use condoms or know the HIV status of your partner(s). ? You take drugs by injection. ? You are sexually active with a partner who has HIV.  Talk with your health care provider about whether you are at high risk of being infected with HIV. If you choose to begin PrEP, you should first be tested for HIV. You should then be tested every 3 months for as long as you are taking PrEP. Pregnancy  If you are premenopausal and you may become pregnant, ask your health care provider about preconception counseling.  If you may become pregnant, take 400 to 800 micrograms (mcg) of folic acid every day.  If you want to prevent pregnancy, talk to your health care provider about birth control (contraception). Osteoporosis and  menopause  Osteoporosis is a disease in which the bones lose minerals and strength with aging. This can result in serious bone fractures. Your risk for osteoporosis can be identified using a bone density scan.  If you are 81 years of age or older, or if you are at risk for osteoporosis and fractures, ask your health care provider if you should be screened.  Ask your health care provider whether you should take a calcium or vitamin D supplement to lower your risk for osteoporosis.  Menopause may have certain physical symptoms and risks.  Hormone replacement therapy may reduce some of these symptoms and risks. Talk to your health care provider about whether hormone replacement therapy is right for you. Follow these instructions at home:  Schedule regular health, dental, and eye exams.  Stay current with your immunizations.  Do not use any tobacco products including cigarettes, chewing tobacco, or electronic cigarettes.  If you are pregnant, do not drink alcohol.  If you are breastfeeding, limit how much and how often you drink alcohol.  Limit alcohol intake to no more than 1 drink per day for nonpregnant women. One drink equals 12 ounces of beer, 5 ounces of wine, or 1 ounces of hard liquor.  Do not use street drugs.  Do not share needles.  Ask your health care provider for help if you need support or information about quitting drugs.  Tell your health care provider if you often feel depressed.  Tell your health care provider if you have ever been abused or do not feel safe at home. This information is not intended to replace advice given to you by your health care provider. Make sure you discuss any questions you have with your health care provider. Document Released: 04/16/2011 Document Revised: 03/08/2016 Document Reviewed: 07/05/2015 Elsevier Interactive Patient Education  [employer].

## 2017-11-04 ENCOUNTER — Other Ambulatory Visit: Payer: Medicare Other

## 2017-11-11 ENCOUNTER — Encounter: Payer: Medicare Other | Admitting: Family Medicine

## 2017-11-13 ENCOUNTER — Encounter: Payer: Medicare Other | Admitting: Family Medicine

## 2017-11-26 ENCOUNTER — Encounter: Payer: Self-pay | Admitting: Women's Health

## 2017-11-26 ENCOUNTER — Ambulatory Visit: Payer: Medicare Other | Admitting: Women's Health

## 2017-11-26 VITALS — BP 122/78 | Ht 64.0 in | Wt 240.0 lb

## 2017-11-26 DIAGNOSIS — R350 Frequency of micturition: Secondary | ICD-10-CM

## 2017-11-26 DIAGNOSIS — N898 Other specified noninflammatory disorders of vagina: Secondary | ICD-10-CM

## 2017-11-26 DIAGNOSIS — N3000 Acute cystitis without hematuria: Secondary | ICD-10-CM | POA: Diagnosis not present

## 2017-11-26 LAB — WET PREP FOR TRICH, YEAST, CLUE

## 2017-11-26 MED ORDER — SULFAMETHOXAZOLE-TRIMETHOPRIM 800-160 MG PO TABS: 1.0000 | ORAL_TABLET | Freq: Two times a day (BID) | ORAL | 0 refills | Status: DC

## 2017-11-26 NOTE — Progress Notes (Signed)
72 year old MWF G2 P2 (1 deceased, MI) presents with complaint of intermittent left lower abdominal discomfort for the past several weeks, rates at a 3-4 on the pain scale. Has seen both primary care and urologist with no problems found. Scant discharge with mild itching occasionally, denies pain or burning with urination or pain at end of stream. Denies constipation, fever, shortness of breath, chest pain. Diagnosed this past year with A. fib on Eliquis and Metorolol. Hypercholesterolemia. Mother deceased from ovarian cancer age 75.  Exam: Appears well. No CVAT. Abdomen obese, nontender, slight tenderness with deep palpation to lower left quadrant. External genitalia within normal limits, atrophic, speculum exam vaginal walls atrophic visible discharge or erythema, wet prep negative. Bimanual no CMT, slight tenderness left lower quadrant. UA: +1 leukocytes, negative blood, negative nitrites, 20-40 WBCs, 10-20 squamous epithelials, many bacteria, moderate mucus.  Probable UTI with atypical presentation  Plan: Septra twice daily for 3 days, increase fluids, instructed to call back and we'll proceed with ultrasound. Urine culture pending.

## 2017-11-26 NOTE — Patient Instructions (Signed)

## 2017-12-04 LAB — URINALYSIS, COMPLETE W/RFL CULTURE
BILIRUBIN URINE: NEGATIVE
Glucose, UA: NEGATIVE
HGB URINE DIPSTICK: NEGATIVE
Nitrites, Initial: NEGATIVE
PROTEIN: NEGATIVE
Specific Gravity, Urine: 1.025 (ref 1.001–1.03)
pH: 5.5 (ref 5.0–8.0)

## 2017-12-04 LAB — URINE CULTURE

## 2017-12-04 LAB — CULTURE INDICATED

## 2017-12-11 ENCOUNTER — Encounter: Payer: Medicare Other | Admitting: Family Medicine

## 2017-12-11 ENCOUNTER — Encounter: Payer: Self-pay | Admitting: Physician Assistant

## 2017-12-11 ENCOUNTER — Ambulatory Visit (INDEPENDENT_AMBULATORY_CARE_PROVIDER_SITE_OTHER): Payer: Medicare Other | Admitting: Physician Assistant

## 2017-12-11 VITALS — BP 122/80 | HR 55 | Temp 97.8°F | Ht 64.0 in | Wt 241.2 lb

## 2017-12-11 DIAGNOSIS — J432 Centrilobular emphysema: Secondary | ICD-10-CM

## 2017-12-11 DIAGNOSIS — I480 Paroxysmal atrial fibrillation: Secondary | ICD-10-CM | POA: Diagnosis not present

## 2017-12-11 DIAGNOSIS — Z0000 Encounter for general adult medical examination without abnormal findings: Secondary | ICD-10-CM | POA: Diagnosis not present

## 2017-12-11 DIAGNOSIS — M1990 Unspecified osteoarthritis, unspecified site: Secondary | ICD-10-CM | POA: Diagnosis not present

## 2017-12-11 DIAGNOSIS — E785 Hyperlipidemia, unspecified: Secondary | ICD-10-CM

## 2017-12-11 DIAGNOSIS — J309 Allergic rhinitis, unspecified: Secondary | ICD-10-CM

## 2017-12-11 NOTE — Assessment & Plan Note (Signed)
Tolerating Meloxicam daily. She is currently at her highest weight, recommend that she get back into her exercise routine using her recumbent bike. Provided emotional support.

## 2017-12-11 NOTE — Patient Instructions (Addendum)
Talk to the pharmacy about the Shingrix shot if you have not received it.  Try to get back in a routine with your recumbent bike.  Health Maintenance, Female Adopting a healthy lifestyle and getting preventive care can go a long way to promote health and wellness. Talk with your health care provider about what schedule of regular examinations is right for you. This is a good chance for you to check in with your provider about disease prevention and staying healthy. In between checkups, there are plenty of things you can do on your own. Experts have done a lot of research about which lifestyle changes and preventive measures are most likely to keep you healthy. Ask your health care provider for more information. Weight and diet Eat a healthy diet  Be sure to include plenty of vegetables, fruits, low-fat dairy products, and lean protein.  Do not eat a lot of foods high in solid fats, added sugars, or salt.  Get regular exercise. This is one of the most important things you can do for your health. ? Most adults should exercise for at least 150 minutes each week. The exercise should increase your heart rate and make you sweat (moderate-intensity exercise). ? Most adults should also do strengthening exercises at least twice a week. This is in addition to the moderate-intensity exercise.  Maintain a healthy weight  Body mass index (BMI) is a measurement that can be used to identify possible weight problems. It estimates body fat based on height and weight. Your health care provider can help determine your BMI and help you achieve or maintain a healthy weight.  For females 43 years of age and older: ? A BMI below 18.5 is considered underweight. ? A BMI of 18.5 to 24.9 is normal. ? A BMI of 25 to 29.9 is considered overweight. ? A BMI of 30 and above is considered obese.  Watch levels of cholesterol and blood lipids  You should start having your blood tested for lipids and cholesterol at 71  years of age, then have this test every 5 years.  You may need to have your cholesterol levels checked more often if: ? Your lipid or cholesterol levels are high. ? You are older than 71 years of age. ? You are at high risk for heart disease.  Cancer screening Lung Cancer  Lung cancer screening is recommended for adults 64-31 years old who are at high risk for lung cancer because of a history of smoking.  A yearly low-dose CT scan of the lungs is recommended for people who: ? Currently smoke. ? Have quit within the past 15 years. ? Have at least a 30-pack-year history of smoking. A pack year is smoking an average of one pack of cigarettes a day for 1 year.  Yearly screening should continue until it has been 15 years since you quit.  Yearly screening should stop if you develop a health problem that would prevent you from having lung cancer treatment.  Breast Cancer  Practice breast self-awareness. This means understanding how your breasts normally appear and feel.  It also means doing regular breast self-exams. Let your health care provider know about any changes, no matter how small.  If you are in your 20s or 30s, you should have a clinical breast exam (CBE) by a health care provider every 1-3 years as part of a regular health exam.  If you are 51 or older, have a CBE every year. Also consider having a breast X-ray (mammogram) every  year.  If you have a family history of breast cancer, talk to your health care provider about genetic screening.  If you are at high risk for breast cancer, talk to your health care provider about having an MRI and a mammogram every year.  Breast cancer gene (BRCA) assessment is recommended for women who have family members with BRCA-related cancers. BRCA-related cancers include: ? Breast. ? Ovarian. ? Tubal. ? Peritoneal cancers.  Results of the assessment will determine the need for genetic counseling and BRCA1 and BRCA2 testing.  Cervical  Cancer Your health care provider may recommend that you be screened regularly for cancer of the pelvic organs (ovaries, uterus, and vagina). This screening involves a pelvic examination, including checking for microscopic changes to the surface of your cervix (Pap test). You may be encouraged to have this screening done every 3 years, beginning at age 27.  For women ages 26-65, health care providers may recommend pelvic exams and Pap testing every 3 years, or they may recommend the Pap and pelvic exam, combined with testing for human papilloma virus (HPV), every 5 years. Some types of HPV increase your risk of cervical cancer. Testing for HPV may also be done on women of any age with unclear Pap test results.  Other health care providers may not recommend any screening for nonpregnant women who are considered low risk for pelvic cancer and who do not have symptoms. Ask your health care provider if a screening pelvic exam is right for you.  If you have had past treatment for cervical cancer or a condition that could lead to cancer, you need Pap tests and screening for cancer for at least 20 years after your treatment. If Pap tests have been discontinued, your risk factors (such as having a new sexual partner) need to be reassessed to determine if screening should resume. Some women have medical problems that increase the chance of getting cervical cancer. In these cases, your health care provider may recommend more frequent screening and Pap tests.  Colorectal Cancer  This type of cancer can be detected and often prevented.  Routine colorectal cancer screening usually begins at 71 years of age and continues through 71 years of age.  Your health care provider may recommend screening at an earlier age if you have risk factors for colon cancer.  Your health care provider may also recommend using home test kits to check for hidden blood in the stool.  A small camera at the end of a tube can be used to  examine your colon directly (sigmoidoscopy or colonoscopy). This is done to check for the earliest forms of colorectal cancer.  Routine screening usually begins at age 16.  Direct examination of the colon should be repeated every 5-10 years through 71 years of age. However, you may need to be screened more often if early forms of precancerous polyps or small growths are found.  Skin Cancer  Check your skin from head to toe regularly.  Tell your health care provider about any new moles or changes in moles, especially if there is a change in a mole's shape or color.  Also tell your health care provider if you have a mole that is larger than the size of a pencil eraser.  Always use sunscreen. Apply sunscreen liberally and repeatedly throughout the day.  Protect yourself by wearing long sleeves, pants, a wide-brimmed hat, and sunglasses whenever you are outside.  Heart disease, diabetes, and high blood pressure  High blood pressure causes heart  disease and increases the risk of stroke. High blood pressure is more likely to develop in: ? People who have blood pressure in the high end of the normal range (130-139/85-89 mm Hg). ? People who are overweight or obese. ? People who are African American.  If you are 68-78 years of age, have your blood pressure checked every 3-5 years. If you are 61 years of age or older, have your blood pressure checked every year. You should have your blood pressure measured twice-once when you are at a hospital or clinic, and once when you are not at a hospital or clinic. Record the average of the two measurements. To check your blood pressure when you are not at a hospital or clinic, you can use: ? An automated blood pressure machine at a pharmacy. ? A home blood pressure monitor.  If you are between 45 years and 15 years old, ask your health care provider if you should take aspirin to prevent strokes.  Have regular diabetes screenings. This involves taking a  blood sample to check your fasting blood sugar level. ? If you are at a normal weight and have a low risk for diabetes, have this test once every three years after 71 years of age. ? If you are overweight and have a high risk for diabetes, consider being tested at a younger age or more often. Preventing infection Hepatitis B  If you have a higher risk for hepatitis B, you should be screened for this virus. You are considered at high risk for hepatitis B if: ? You were born in a country where hepatitis B is common. Ask your health care provider which countries are considered high risk. ? Your parents were born in a high-risk country, and you have not been immunized against hepatitis B (hepatitis B vaccine). ? You have HIV or AIDS. ? You use needles to inject street drugs. ? You live with someone who has hepatitis B. ? You have had sex with someone who has hepatitis B. ? You get hemodialysis treatment. ? You take certain medicines for conditions, including cancer, organ transplantation, and autoimmune conditions.  Hepatitis C  Blood testing is recommended for: ? Everyone born from [birth year] through 1964. ? Anyone with known risk factors for hepatitis C.  Sexually transmitted infections (STIs)  You should be screened for sexually transmitted infections (STIs) including gonorrhea and chlamydia if: ? You are sexually active and are younger than 71 years of age. ? You are older than 71 years of age and your health care provider tells you that you are at risk for this type of infection. ? Your sexual activity has changed since you were last screened and you are at an increased risk for chlamydia or gonorrhea. Ask your health care provider if you are at risk.  If you do not have HIV, but are at risk, it may be recommended that you take a prescription medicine daily to prevent HIV infection. This is called pre-exposure prophylaxis (PrEP). You are considered at risk if: ? You are sexually active and  do not regularly use condoms or know the HIV status of your partner(s). ? You take drugs by injection. ? You are sexually active with a partner who has HIV.  Talk with your health care provider about whether you are at high risk of being infected with HIV. If you choose to begin PrEP, you should first be tested for HIV. You should then be tested every 3 months for as long as you  are taking PrEP. Pregnancy  If you are premenopausal and you may become pregnant, ask your health care provider about preconception counseling.  If you may become pregnant, take 400 to 800 micrograms (mcg) of folic acid every day.  If you want to prevent pregnancy, talk to your health care provider about birth control (contraception). Osteoporosis and menopause  Osteoporosis is a disease in which the bones lose minerals and strength with aging. This can result in serious bone fractures. Your risk for osteoporosis can be identified using a bone density scan.  If you are 58 years of age or older, or if you are at risk for osteoporosis and fractures, ask your health care provider if you should be screened.  Ask your health care provider whether you should take a calcium or vitamin D supplement to lower your risk for osteoporosis.  Menopause may have certain physical symptoms and risks.  Hormone replacement therapy may reduce some of these symptoms and risks. Talk to your health care provider about whether hormone replacement therapy is right for you. Follow these instructions at home:  Schedule regular health, dental, and eye exams.  Stay current with your immunizations.  Do not use any tobacco products including cigarettes, chewing tobacco, or electronic cigarettes.  If you are pregnant, do not drink alcohol.  If you are breastfeeding, limit how much and how often you drink alcohol.  Limit alcohol intake to no more than 1 drink per day for nonpregnant women. One drink equals 12 ounces of beer, 5 ounces of  wine, or 1 ounces of hard liquor.  Do not use street drugs.  Do not share needles.  Ask your health care provider for help if you need support or information about quitting drugs.  Tell your health care provider if you often feel depressed.  Tell your health care provider if you have ever been abused or do not feel safe at home. This information is not intended to replace advice given to you by your health care provider. Make sure you discuss any questions you have with your health care provider. Document Released: 04/16/2011 Document Revised: 03/08/2016 Document Reviewed: 07/05/2015 Elsevier Interactive Patient Education  [employer].

## 2017-12-11 NOTE — Progress Notes (Signed)
I acted as a Education administrator for [employer], PA-C Anselmo Pickler, LPN   Subjective:    [name] is a 72 y.o. female and is here for a comprehensive physical exam.  *Patient had labs completed and reviewed by her PCP, Dr. Yong Channel, during her AWV on 10/31/17.   HPI  There are no preventive care reminders to display for this patient.  Acute Concerns: None  Chronic Issues: HLD - currently on Crestor 10 mg daily -- denies unusual muscle aches or pain. Most recent labs from last month showed good control. LDL 79. COPD - still uses Flovent daily, albuterol prn -- has not used abluterol any time recently OA - s/p R knee replacement 2018. Uses Mobic daily. Allergic rhinitis -- has dogs and cats, normally manages with zyrtec and nasal wash. Sinuses have been draining. No fever. No SOB. Slight cough from PND. Paroxysmal afib -- sees Dr. Fletcher Anon in cardiology, last seen on 10/21/17. No recent spells of paroxysmal afib that she is aware of.   Wt Readings from Last 3 Encounters:  12/11/17 241 lb 4 oz (109.4 kg)  11/26/17 240 lb (108.9 kg)  10/31/17 238 lb (108 kg)   Health Maintenance: Immunizations -- needs Shingrix -- plans to talk with pharmacist Colonoscopy -- 11/2015 -- probably won't need another Mammogram -- 11/2016 Bone Density -- defer to GYN, dexa 2012  -1.9 - ortho was following Diet -- only drinks water and milk (16oz 2 x a week -- whole milk), trying to eat less sweets; last year she tried to do Paleo Sleep habits -- good sleeper Exercise -- was doing water aerobics, hasn't been recently; recumbent bike at home that she is trying to get back into using routinely Weight -- Weight: 241 lb 4 oz (109.4 kg) -- most she has ever weighed Mood -- "I'm a pisces" - very jovial  Depression screen PHQ 2/9 12/11/2017  Decreased Interest 0  Down, Depressed, Hopeless 0  PHQ - 2 Score 0   Other providers/specialists: [facility] -- ob-gyn Dr. Fletcher Anon -- cardiology Dr. Noland Fordyce  -- hx of 2  kidney stones in R kidney Dermatology -- sees yearly goes in the summer   PMHx, SurgHx, SocialHx, Medications, and Allergies were reviewed in the Visit Navigator and updated as appropriate.   Past Medical History:  Diagnosis Date  . Allergy   . ASCUS on Pap smear 2007  . ASTHMA UNSPECIFIED WITH EXACERBATION 11/08/2008  . Cancer of skin of left ear   . Chest pain 03/2011   negative stress test - Dr. Ron Parker  . History of frequent urinary tract infections   . History of kidney stones   . [place] 02/09/2009  . Kidney stones   . OSTEOARTHRITIS 04/24/2007  . Osteopenia   . Osteoporosis   . Pneumonia   . PONV (postoperative nausea and vomiting)   . SHINGLES 11/08/2008   had vaccine at drugstore 12/14  . VARICOSE VEINS LOWER EXTREMITIES W/INFLAMMATION 02/05/2008     Past Surgical History:  Procedure Laterality Date  . Bone spur shoulder     x2 removed  . COLPOSCOPY     Neg HR HPV  . KNEE ARTHROSCOPY    . laser surgery per left ey     secondary to film covering   . lens implants- both eyes    . SPINE SURGERY  2008   lumbar  . TOTAL KNEE ARTHROPLASTY Right 08/07/2016   Procedure: RIGHT TOTAL KNEE ARTHROPLASTY;  Surgeon: Paralee Cancel, MD;  Location: [facility];  Service: Orthopedics;  Laterality: Right;     Family History  Problem Relation Age of Onset  . Ovarian cancer Mother   . Melanoma Mother   . Hypertension Father   . Heart disease Father        MI 58  . Heart disease Paternal Grandfather   . Cancer Maternal Grandmother        Oral cancer  . Down syndrome Sister   . Prostate cancer Maternal Uncle   . Heart disease Maternal Grandfather   . Prostate cancer Maternal Uncle   . Prostate cancer Maternal Uncle   . Breast cancer Cousin        two maternal cousins diagnosed <50  . Diabetes Paternal Aunt   . Sudden Cardiac Death Son        unclear cause.   . Colon cancer Neg Hx   . Esophageal cancer Neg Hx   . Rectal cancer Neg Hx   . Stomach cancer Neg  Hx     Social History   Tobacco Use  . Smoking status: Passive Smoke Exposure - Never Smoker  . Smokeless tobacco: Never Used  . Tobacco comment: last exposure 1999  Substance Use Topics  . Alcohol use: Yes    Alcohol/week: 0.6 oz    Types: 1 Standard drinks or equivalent per week    Comment: monthly  . Drug use: No    Review of Systems:   Review of Systems  Constitutional: Negative.  Negative for chills, fever, malaise/fatigue and weight loss.  HENT: Negative.  Negative for hearing loss, sinus pain and sore throat.   Eyes: Negative.  Negative for blurred vision.  Respiratory: Negative for cough and shortness of breath.        Slight cough due to sinus drainage.  Cardiovascular: Negative.  Negative for chest pain, palpitations and leg swelling.  Gastrointestinal: Negative.  Negative for abdominal pain, constipation, diarrhea, heartburn, nausea and vomiting.  Genitourinary: Negative.  Negative for dysuria, frequency and urgency.  Musculoskeletal: Negative.  Negative for back pain, myalgias and neck pain.  Skin: Negative.  Negative for itching and rash.  Neurological: Negative.  Negative for dizziness, tingling, seizures, loss of consciousness and headaches.  Endo/Heme/Allergies: Negative.  Negative for polydipsia.  Psychiatric/Behavioral: Negative.  Negative for depression. The patient is not nervous/anxious.     Objective:   BP 122/80 (BP Location: Left Arm, Patient Position: Sitting, Cuff Size: Large)   Pulse (!) 55   Temp 97.8 F (36.6 C) (Oral)   Ht 5\' 4"  (1.626 m)   Wt 241 lb 4 oz (109.4 kg)   SpO2 96%   BMI 41.41 kg/m  Body mass index is 41.41 kg/m.   General Appearance:    Alert, cooperative, no distress, appears stated age  Head:    Normocephalic, without obvious abnormality, atraumatic  Eyes:    PERRL, conjunctiva/corneas clear, EOM's intact, fundi    benign, both eyes  Ears:    Normal TM's and external ear canals, both ears  Nose:   Nares normal, septum  midline, mucosa normal, no drainage    or sinus tenderness  Throat:   Lips, mucosa, and tongue normal; teeth and gums normal  Neck:   Supple, symmetrical, trachea midline, no adenopathy;    thyroid:  no enlargement/tenderness/nodules; no carotid   bruit or JVD  Back:     Symmetric, no curvature, ROM normal, no CVA tenderness  Lungs:     Clear to auscultation bilaterally, respirations unlabored  Chest Wall:    No tenderness or deformity   Heart:    Regular rate and rhythm, S1 and S2 normal, no murmur, rub   or gallop  Breast Exam:    Deferred -- sees ob-gyn  Abdomen:     Soft, non-tender, bowel sounds active all four quadrants,    no masses, no organomegaly  Genitalia:    Deferred -- sees ob-gyn  Extremities:   Extremities normal, atraumatic, no cyanosis or edema  Pulses:   2+ and symmetric all extremities  Skin:   Skin color, texture, turgor normal, no rashes or lesions  Lymph nodes:   Cervical, supraclavicular, and axillary nodes normal  Neurologic:   CNII-XII intact, normal strength, sensation and reflexes    throughout    Assessment/Plan:   Problem List Items Addressed This Visit      Cardiovascular and Mediastinum   Paroxysmal atrial fibrillation (HCC)    Currently on 5mg  Eliquis and 25mg  Lopressor BID, sees Dr. Fletcher Anon with cards. Most recent echo in 2019.        Respiratory   Allergic rhinitis    Currently managed with Zyrtec and nasal saline wash. No signs of bacterial infection at this time.      COPD (chronic obstructive pulmonary disease) ([place])    She is doing well on her current regimen of Flovent. Has not required Proair.         Musculoskeletal and Integument   Osteoarthritis    Tolerating Meloxicam daily. She is currently at her highest weight, recommend that she get back into her exercise routine using her recumbent bike. Provided emotional support.        Other   Hyperlipidemia    Followed closely by cardiology. LDL is 79. Continue to monitor, is slowly  trending up. Recommend follow-up in 3 months for this. Lab Results  Component Value Date   CHOL 150 10/31/2017   HDL 57.00 10/31/2017   LDLCALC 79 10/31/2017   LDLDIRECT 149.5 10/24/2012   TRIG 69.0 10/31/2017   CHOLHDL 3 10/31/2017          Other Visit Diagnoses    Routine general medical examination at a health care facility    -  Primary Today patient counseled on age appropriate routine health concerns for screening and prevention, each reviewed and up to date or declined. Immunizations reviewed and up to date or declined. Labs ordered and reviewed. Risk factors for depression reviewed and negative. Hearing function and visual acuity are intact. ADLs screened and addressed as needed. Functional ability and level of safety reviewed and appropriate. Education, counseling and referrals performed based on assessed risks today. Patient provided with a copy of personalized plan for preventive services.       Well Adult Exam: Labs ordered: No. Patient counseling was done. See below for items discussed. Discussed the patient's BMI.  The BMI BMI is not in the acceptable range; BMI management plan is completed Follow up in 3 months. Breast cancer screening: UTD. Cervical cancer screening: n/a   Patient Counseling: [x]    Nutrition: Stressed importance of moderation in sodium/caffeine intake, saturated fat and cholesterol, caloric balance, sufficient intake of fresh fruits, vegetables, fiber, calcium, iron, and 1 mg of folate supplement per day (for females capable of pregnancy).  [x]    Stressed the importance of regular exercise.   [x]    Substance Abuse: Discussed cessation/primary prevention of tobacco, alcohol, or other drug use; driving or other dangerous activities under the influence; availability of treatment for abuse.   [  x]   Injury prevention: Discussed safety belts, safety helmets, smoke detector, smoking near bedding or upholstery.   [x]    Sexuality: Discussed sexually transmitted  diseases, partner selection, use of condoms, avoidance of unintended pregnancy  and contraceptive alternatives.  [x]    Dental health: Discussed importance of regular tooth brushing, flossing, and dental visits.  [x]    Health maintenance and immunizations reviewed. Please refer to Health maintenance section.   CMA or LPN served as scribe during this visit. History, Physical, and Plan performed by medical provider. Documentation and orders reviewed and attested to.  Inda Coke, PA-C [employer]

## 2017-12-11 NOTE — Assessment & Plan Note (Signed)
She is doing well on her current regimen of Flovent. Has not required Proair.

## 2017-12-11 NOTE — Assessment & Plan Note (Signed)
Currently managed with Zyrtec and nasal saline wash. No signs of bacterial infection at this time.

## 2017-12-11 NOTE — Assessment & Plan Note (Addendum)
Currently on 5mg  Eliquis and 25mg  Lopressor BID, sees Dr. Fletcher Anon with cards. Most recent echo in 2019.

## 2017-12-11 NOTE — Assessment & Plan Note (Signed)
Followed closely by cardiology. LDL is 79. Continue to monitor, is slowly trending up. Recommend follow-up in 3 months for this. Lab Results  Component Value Date   CHOL 150 10/31/2017   HDL 57.00 10/31/2017   LDLCALC 79 10/31/2017   LDLDIRECT 149.5 10/24/2012   TRIG 69.0 10/31/2017   CHOLHDL 3 10/31/2017

## 2017-12-25 ENCOUNTER — Ambulatory Visit (INDEPENDENT_AMBULATORY_CARE_PROVIDER_SITE_OTHER): Payer: Medicare Other | Admitting: Women's Health

## 2017-12-25 ENCOUNTER — Encounter: Payer: Self-pay | Admitting: Women's Health

## 2017-12-25 VITALS — BP 124/80 | Ht 64.0 in | Wt 241.0 lb

## 2017-12-25 DIAGNOSIS — Z01419 Encounter for gynecological examination (general) (routine) without abnormal findings: Secondary | ICD-10-CM | POA: Diagnosis not present

## 2017-12-25 NOTE — Patient Instructions (Signed)
Health Maintenance for Postmenopausal Women Menopause is a normal process in which your reproductive ability comes to an end. This process happens gradually over a span of months to years, usually between the ages of 22 and 9. Menopause is complete when you have missed 12 consecutive menstrual periods. It is important to talk with your health care provider about some of the most common conditions that affect postmenopausal women, such as heart disease, cancer, and bone loss (osteoporosis). Adopting a healthy lifestyle and getting preventive care can help to promote your health and wellness. Those actions can also lower your chances of developing some of these common conditions. What should I know about menopause? During menopause, you may experience a number of symptoms, such as:  Moderate-to-severe hot flashes.  Night sweats.  Decrease in sex drive.  Mood swings.  Headaches.  Tiredness.  Irritability.  Memory problems.  Insomnia.  Choosing to treat or not to treat menopausal changes is an individual decision that you make with your health care provider. What should I know about hormone replacement therapy and supplements? Hormone therapy products are effective for treating symptoms that are associated with menopause, such as hot flashes and night sweats. Hormone replacement carries certain risks, especially as you become older. If you are thinking about using estrogen or estrogen with progestin treatments, discuss the benefits and risks with your health care provider. What should I know about heart disease and stroke? Heart disease, heart attack, and stroke become more likely as you age. This may be due, in part, to the hormonal changes that your body experiences during menopause. These can affect how your body processes dietary fats, triglycerides, and cholesterol. Heart attack and stroke are both medical emergencies. There are many things that you can do to help prevent heart disease  and stroke:  Have your blood pressure checked at least every 1-2 years. High blood pressure causes heart disease and increases the risk of stroke.  If you are 53-22 years old, ask your health care provider if you should take aspirin to prevent a heart attack or a stroke.  Do not use any tobacco products, including cigarettes, chewing tobacco, or electronic cigarettes. If you need help quitting, ask your health care provider.  It is important to eat a healthy diet and maintain a healthy weight. ? Be sure to include plenty of vegetables, fruits, low-fat dairy products, and lean protein. ? Avoid eating foods that are high in solid fats, added sugars, or salt (sodium).  Get regular exercise. This is one of the most important things that you can do for your health. ? Try to exercise for at least 150 minutes each week. The type of exercise that you do should increase your heart rate and make you sweat. This is known as moderate-intensity exercise. ? Try to do strengthening exercises at least twice each week. Do these in addition to the moderate-intensity exercise.  Know your numbers.Ask your health care provider to check your cholesterol and your blood glucose. Continue to have your blood tested as directed by your health care provider.  What should I know about cancer screening? There are several types of cancer. Take the following steps to reduce your risk and to catch any cancer development as early as possible. Breast Cancer  Practice breast self-awareness. ? This means understanding how your breasts normally appear and feel. ? It also means doing regular breast self-exams. Let your health care provider know about any changes, no matter how small.  If you are 40  or older, have a clinician do a breast exam (clinical breast exam or CBE) every year. Depending on your age, family history, and medical history, it may be recommended that you also have a yearly breast X-ray (mammogram).  If you  have a family history of breast cancer, talk with your health care provider about genetic screening.  If you are at high risk for breast cancer, talk with your health care provider about having an MRI and a mammogram every year.  Breast cancer (BRCA) gene test is recommended for women who have family members with BRCA-related cancers. Results of the assessment will determine the need for genetic counseling and BRCA1 and for BRCA2 testing. BRCA-related cancers include these types: ? Breast. This occurs in males or females. ? Ovarian. ? Tubal. This may also be called fallopian tube cancer. ? Cancer of the abdominal or pelvic lining (peritoneal cancer). ? Prostate. ? Pancreatic.  Cervical, Uterine, and Ovarian Cancer Your health care provider may recommend that you be screened regularly for cancer of the pelvic organs. These include your ovaries, uterus, and vagina. This screening involves a pelvic exam, which includes checking for microscopic changes to the surface of your cervix (Pap test).  For women ages 21-65, health care providers may recommend a pelvic exam and a Pap test every three years. For women ages 79-65, they may recommend the Pap test and pelvic exam, combined with testing for human papilloma virus (HPV), every five years. Some types of HPV increase your risk of cervical cancer. Testing for HPV may also be done on women of any age who have unclear Pap test results.  Other health care providers may not recommend any screening for nonpregnant women who are considered low risk for pelvic cancer and have no symptoms. Ask your health care provider if a screening pelvic exam is right for you.  If you have had past treatment for cervical cancer or a condition that could lead to cancer, you need Pap tests and screening for cancer for at least 20 years after your treatment. If Pap tests have been discontinued for you, your risk factors (such as having a new sexual partner) need to be  reassessed to determine if you should start having screenings again. Some women have medical problems that increase the chance of getting cervical cancer. In these cases, your health care provider may recommend that you have screening and Pap tests more often.  If you have a family history of uterine cancer or ovarian cancer, talk with your health care provider about genetic screening.  If you have vaginal bleeding after reaching menopause, tell your health care provider.  There are currently no reliable tests available to screen for ovarian cancer.  Lung Cancer Lung cancer screening is recommended for adults 69-62 years old who are at high risk for lung cancer because of a history of smoking. A yearly low-dose CT scan of the lungs is recommended if you:  Currently smoke.  Have a history of at least 30 pack-years of smoking and you currently smoke or have quit within the past 15 years. A pack-year is smoking an average of one pack of cigarettes per day for one year.  Yearly screening should:  Continue until it has been 15 years since you quit.  Stop if you develop a health problem that would prevent you from having lung cancer treatment.  Colorectal Cancer  This type of cancer can be detected and can often be prevented.  Routine colorectal cancer screening usually begins at  age 42 and continues through age 45.  If you have risk factors for colon cancer, your health care provider may recommend that you be screened at an earlier age.  If you have a family history of colorectal cancer, talk with your health care provider about genetic screening.  Your health care provider may also recommend using home test kits to check for hidden blood in your stool.  A small camera at the end of a tube can be used to examine your colon directly (sigmoidoscopy or colonoscopy). This is done to check for the earliest forms of colorectal cancer.  Direct examination of the colon should be repeated every  5-10 years until age 71. However, if early forms of precancerous polyps or small growths are found or if you have a family history or genetic risk for colorectal cancer, you may need to be screened more often.  Skin Cancer  Check your skin from head to toe regularly.  Monitor any moles. Be sure to tell your health care provider: ? About any new moles or changes in moles, especially if there is a change in a mole's shape or color. ? If you have a mole that is larger than the size of a pencil eraser.  If any of your family members has a history of skin cancer, especially at a Dreydon Cardenas age, talk with your health care provider about genetic screening.  Always use sunscreen. Apply sunscreen liberally and repeatedly throughout the day.  Whenever you are outside, protect yourself by wearing long sleeves, pants, a wide-brimmed hat, and sunglasses.  What should I know about osteoporosis? Osteoporosis is a condition in which bone destruction happens more quickly than new bone creation. After menopause, you may be at an increased risk for osteoporosis. To help prevent osteoporosis or the bone fractures that can happen because of osteoporosis, the following is recommended:  If you are 46-71 years old, get at least 1,000 mg of calcium and at least 600 mg of vitamin D per day.  If you are older than age 55 but younger than age 65, get at least 1,200 mg of calcium and at least 600 mg of vitamin D per day.  If you are older than age 54, get at least 1,200 mg of calcium and at least 800 mg of vitamin D per day.  Smoking and excessive alcohol intake increase the risk of osteoporosis. Eat foods that are rich in calcium and vitamin D, and do weight-bearing exercises several times each week as directed by your health care provider. What should I know about how menopause affects my mental health? Depression may occur at any age, but it is more common as you become older. Common symptoms of depression  include:  Low or sad mood.  Changes in sleep patterns.  Changes in appetite or eating patterns.  Feeling an overall lack of motivation or enjoyment of activities that you previously enjoyed.  Frequent crying spells.  Talk with your health care provider if you think that you are experiencing depression. What should I know about immunizations? It is important that you get and maintain your immunizations. These include:  Tetanus, diphtheria, and pertussis (Tdap) booster vaccine.  Influenza every year before the flu season begins.  Pneumonia vaccine.  Shingles vaccine.  Your health care provider may also recommend other immunizations. This information is not intended to replace advice given to you by your health care provider. Make sure you discuss any questions you have with your health care provider. Document Released: 11/23/2005  Document Revised: 04/20/2016 Document Reviewed: 07/05/2015 Elsevier Interactive Patient Education  2018 Elsevier Inc.  

## 2017-12-25 NOTE — Progress Notes (Signed)
[name] Feb 22, 1946 [id number]    History:    Presents for breast and pelvic exam. Postmenopausal on no HRT with no bleeding. Not sexually active/husbands health.  Normal Pap and mammogram history. Vaccines current. Negative colonoscopy 13-Jan-2016. Right knee replacement January 13, 2016 doing well. Cardiologist managing A. Fib on Eliquis.  Past medical history, past surgical history, family history and social history were all reviewed and documented in the EPIC chart. Retired Art therapist. 01-13-2015 son died of  MI at age 60. Granddaughter at age 17 died in a mass shooting. Has been prostate cancer and MI.  ROS:  A ROS was performed and pertinent positives and negatives are included.  Exam:  Vitals:   12/25/17 1134  BP: 124/80  Weight: 241 lb (109.3 kg)  Height: 5\' 4"  (1.626 m)   Body mass index is 41.37 kg/m.   General appearance:  Normal Thyroid:  Symmetrical, normal in size, without palpable masses or nodularity. Respiratory  Auscultation:  Clear without wheezing or rhonchi Cardiovascular  Auscultation:  Regular rate, without rubs, murmurs or gallops  Edema/varicosities:  Not grossly evident Abdominal  Soft,nontender, without masses, guarding or rebound.  Liver/spleen:  No organomegaly noted  Hernia:  None appreciated  Skin  Inspection:  Grossly normal   Breasts: Examined lying and sitting.     Right: Without masses, retractions, discharge or axillary adenopathy.     Left: Without masses, retractions, discharge or axillary adenopathy. Gentitourinary   Inguinal/mons:  Normal without inguinal adenopathy  External genitalia:  Normal  BUS/Urethra/Skene's glands:  Normal  Vagina:  Normal  Cervix:  Normal  Uterus:  normal in size, shape and contour.  Midline and mobile  Adnexa/parametria:     Rt: Without masses or tenderness.   Lt: Without masses or tenderness.  Anus and perineum: Normal  Digital rectal exam: Normal sphincter tone without palpated masses or tenderness  Assessment/Plan:   72 y.o.M WF G2 P2 for breast and pelvic exam with no complaints.  Post menopausal on no HRT with no bleeding A. Fib on Eliquis - cardiologist manages Hypertension, hypercholesterolemia, allergies-primary care manages labs and meds Obesity  Plan: SBE's, continue annual screening mammogram, calcium rich  Vitamin D 2000 daily encouraged. Reviewed importance of increasing exercise and decreasing carbs/calories for weight loss. Home safety, fall prevention and importance of balance type exercise reviewed. Continue water aerobics. Pap screening guidelines reviewed.     Huel C[facility], 1:20 PM 12/25/2017

## 2018-01-22 ENCOUNTER — Other Ambulatory Visit: Payer: Self-pay | Admitting: Cardiovascular Disease

## 2018-01-23 ENCOUNTER — Other Ambulatory Visit: Payer: Self-pay | Admitting: *Deleted

## 2018-01-23 MED ORDER — ROSUVASTATIN CALCIUM 10 MG PO TABS: ORAL_TABLET | ORAL | 2 refills | Status: DC

## 2018-01-27 ENCOUNTER — Other Ambulatory Visit: Payer: Self-pay | Admitting: Cardiovascular Disease

## 2018-01-27 NOTE — Telephone Encounter (Signed)
Refill Request.  

## 2018-04-09 ENCOUNTER — Other Ambulatory Visit: Payer: Self-pay | Admitting: Cardiovascular Disease

## 2018-04-09 MED ORDER — APIXABAN 5 MG PO TABS: 5.0000 mg | ORAL_TABLET | Freq: Two times a day (BID) | ORAL | 1 refills | Status: DC

## 2018-04-09 MED ORDER — METOPROLOL TARTRATE 25 MG PO TABS: 25.0000 mg | ORAL_TABLET | Freq: Two times a day (BID) | ORAL | 3 refills | Status: DC

## 2018-04-09 NOTE — Telephone Encounter (Signed)
°*  STAT* If patient is at the pharmacy, call can be transferred to refill team.   1. Which medications need to be refilled? (please list name of each medication and dose if known)    Eliquis 5 mg po BID   Metoprolol 25 mg po BID  2. Which pharmacy/location (including street and city if local pharmacy) is medication to be sent to?    [facility] ([place] [place] [address]  3. Do they need a 30 day or 90 day supply? [place]

## 2018-04-09 NOTE — Telephone Encounter (Signed)
Please review for refill on Eliquis 2.5 mg

## 2018-04-29 ENCOUNTER — Telehealth: Payer: Self-pay | Admitting: Cardiovascular Disease

## 2018-04-29 NOTE — Telephone Encounter (Signed)
Patient notified and verbalized understanding of recommendations. 

## 2018-04-29 NOTE — Telephone Encounter (Signed)
Ok for pt to use with her meds. If she is using the version containing oxymetazoline, would recommend she limit use to 3 days in a row to avoid rebound congestion.

## 2018-04-29 NOTE — Telephone Encounter (Signed)
Pt calling stating she is flying out today, and usually uses a nasal spay  It is used for her congestion  Would like to know due to the heart medications she is on Would this be okay to use She flies out today around 1pm   Needs to know something by than

## 2018-04-29 NOTE — Telephone Encounter (Signed)
Patient says she uses Neo-Synephrine nasal spray to help her with congestion when she is flying. She wants to make sure it does not interfere with her heart medications. Instructed her I will route to our pharmacist to make sure.

## 2018-06-11 ENCOUNTER — Telehealth: Payer: Self-pay | Admitting: Cardiovascular Disease

## 2018-06-11 NOTE — Telephone Encounter (Signed)
Spoke with patient and she wanted to know if the second shot would be a issue with her medications. Advised that this should not be an issue at all and that when she gets that done they should look over her medication list but that our providers do recommend this second shot for shingles. She was appreciative for the call with no further questions at this time.

## 2018-06-11 NOTE — Telephone Encounter (Signed)
Patient calling  States she will need a second shingle shot but would like to know it is ok with condition Please call to discuss

## 2018-07-16 ENCOUNTER — Other Ambulatory Visit: Payer: Self-pay | Admitting: Family Medicine

## 2018-08-05 ENCOUNTER — Ambulatory Visit (INDEPENDENT_AMBULATORY_CARE_PROVIDER_SITE_OTHER): Payer: Medicare Other | Admitting: Cardiovascular Disease

## 2018-08-05 ENCOUNTER — Encounter: Payer: Self-pay | Admitting: Cardiovascular Disease

## 2018-08-05 VITALS — BP 128/60 | HR 67 | Ht 64.0 in | Wt 248.5 lb

## 2018-08-05 DIAGNOSIS — E785 Hyperlipidemia, unspecified: Secondary | ICD-10-CM | POA: Diagnosis not present

## 2018-08-05 DIAGNOSIS — I480 Paroxysmal atrial fibrillation: Secondary | ICD-10-CM | POA: Diagnosis not present

## 2018-08-05 NOTE — Patient Instructions (Signed)

## 2018-08-05 NOTE — Progress Notes (Signed)
Cardiology Office Note   Date:  08/05/2018   ID:  [name], DOB 1946-04-19, MRN [id number]  PCP:  Marin Olp, MD  Cardiologist:   Kathlyn Sacramento, MD   Chief Complaint  Patient presents with  . other    6 month follow up. Meds reviewed by the pt. verbally. "doing well."       History of Present Illness: [name] is a 72 y.o. female who presents for a follow-up visit regarding paroxysmal atrial fibrillation.  She has known history of COPD from second hand smoking, and hyperlipidemia. She is a lifelong nonsmoker. She has no history of hypertension or diabetes.  She had a nuclear stress test in 2016 for atypical chest pain and back normal. The patient is on metoprolol and Eliquis. She has been doing well with no recent chest pain, shortness of breath or palpitations.  She gained 7 pounds since last visit.  She takes her medications regularly.   Past Medical History:  Diagnosis Date  . Allergy   . ASCUS on Pap smear 2007  . ASTHMA UNSPECIFIED WITH EXACERBATION 11/08/2008  . Cancer of skin of left ear   . Chest pain 03/2011   negative stress test - Dr. Ron Parker  . History of frequent urinary tract infections   . History of kidney stones   . Stone, [place] 02/09/2009  . Kidney stones   . OSTEOARTHRITIS 04/24/2007  . Osteopenia   . Osteoporosis   . Pneumonia   . PONV (postoperative nausea and vomiting)   . SHINGLES 11/08/2008   had vaccine at drugstore 12/14  . VARICOSE VEINS LOWER EXTREMITIES W/INFLAMMATION 02/05/2008    Past Surgical History:  Procedure Laterality Date  . Bone spur shoulder     x2 removed  . COLPOSCOPY     Neg HR HPV  . KNEE ARTHROSCOPY    . laser surgery per left ey     secondary to film covering   . lens implants- both eyes    . SPINE SURGERY  2008   lumbar  . TOTAL KNEE ARTHROPLASTY Right 08/07/2016   Procedure: RIGHT TOTAL KNEE ARTHROPLASTY;  Surgeon: Paralee Cancel, MD;  Location: [facility];  Service: Orthopedics;   Laterality: Right;     Current Outpatient Medications  Medication Sig Dispense Refill  . apixaban (ELIQUIS) 5 MG TABS tablet Take 1 tablet (5 mg total) by mouth 2 (two) times daily. 180 tablet 1  . Calcium Citrate 250 MG TABS Take 1,000 mg by mouth 2 (two) times daily.    . cetirizine (ZYRTEC ALLERGY) 10 MG tablet Take 10 mg by mouth at bedtime.      . Cholecalciferol (VITAMIN D3) 2000 UNITS TABS Take 2,000 Units by mouth 2 (two) times daily.      . fish oil-omega-3 fatty acids 1000 MG capsule Take 2 capsules (2 g total) by mouth 2 (two) times daily. total for 4,000 mg (Patient taking differently: Take 1 g by mouth daily. )    . FLOVENT HFA 110 MCG/ACT inhaler INHALE 2 PUFFS INTO LUNGS TWICE DAILY 12 g 3  . GLUCOSAMINE SULFATE PO Take 2 tablets by mouth 2 (two) times daily.     . metoprolol tartrate (LOPRESSOR) 25 MG tablet Take 1 tablet (25 mg total) by mouth 2 (two) times daily. 180 tablet 3  . Multiple Vitamin (MULTIVITAMIN) tablet Take 1 tablet by mouth daily.    . Olopatadine HCl (PAZEO) 0.7 % SOLN Apply 1 drop to eye as needed.    Marland Kitchen  prednisoLONE acetate (PRED FORTE) 1 % ophthalmic suspension Place 1 drop into both eyes 2 (two) times daily.    Marland Kitchen PROAIR HFA 108 (90 Base) MCG/ACT inhaler INHALE TWO PUFFS INTO LUNGS EVERY 6 HOURS AS NEEDED FOR WHEEZING FOR SHORTNESS OF BREATH 9 each 5  . rosuvastatin (CRESTOR) 10 MG tablet TAKE 1 TABLET BY MOUTH ONCE DAILY 90 tablet 2   No current facility-administered medications for this visit.     Allergies:   Atorvastatin; Cefuroxime axetil; and Other    Social History:  The patient  reports that she is a non-smoker but has been exposed to tobacco smoke. She has never used smokeless tobacco. She reports that she drinks about 1.0 standard drinks of alcohol per week. She reports that she does not use drugs.   Family History:  The patient's family history includes Breast cancer in her cousin; Cancer in her maternal grandmother; Diabetes in her paternal  aunt; Down syndrome in her sister; Heart disease in her father, maternal grandfather, and paternal grandfather; Hypertension in her father; Melanoma in her mother; Ovarian cancer in her mother; Prostate cancer in her maternal uncle, maternal uncle, and maternal uncle; Sudden Cardiac Death in her son.    ROS:  Please see the history of present illness.   Otherwise, review of systems are positive for none.   All other systems are reviewed and negative.    PHYSICAL EXAM: VS:  BP 128/60 (BP Location: Left Arm, Patient Position: Sitting, Cuff Size: Large)   Pulse 67   Ht 5\' 4"  (1.626 m)   Wt 248 lb 8 oz (112.7 kg)   BMI 42.65 kg/m  , BMI Body mass index is 42.65 kg/m. GEN: Well nourished, well developed, in no acute distress  HEENT: normal  Neck: no JVD, carotid bruits, or masses Cardiac: RRR; no murmurs, rubs, or gallops,no edema  Respiratory:  clear to auscultation bilaterally, normal work of breathing GI: soft, nontender, nondistended, + BS MS: no deformity or atrophy  Skin: warm and dry, no rash Neuro:  Strength and sensation are intact Psych: euthymic mood, full affect   EKG:  EKG is ordered today. The ekg ordered today demonstrates  normal sinus rhythm with low voltage. No significant ST or T wave changes.   Recent Labs: 10/31/2017: ALT 14; BUN 27; Creatinine, Ser 0.68; Hemoglobin 13.9; Platelets 159.0; Potassium 4.8; Sodium 140    Lipid Panel    Component Value Date/Time   CHOL 150 10/31/2017 0906   CHOL 221 (H) 06/17/2015 0856   TRIG 69.0 10/31/2017 0906   HDL 57.00 10/31/2017 0906   HDL 67 06/17/2015 0856   CHOLHDL 3 10/31/2017 0906   VLDL 13.8 10/31/2017 0906   LDLCALC 79 10/31/2017 0906   LDLCALC 142 (H) 06/17/2015 0856   LDLDIRECT 149.5 10/24/2012 0831      Wt Readings from Last 3 Encounters:  08/05/18 248 lb 8 oz (112.7 kg)  12/25/17 241 lb (109.3 kg)  12/11/17 241 lb 4 oz (109.4 kg)       No flowsheet data found.    ASSESSMENT AND PLAN:  1.    Paroxysmal atrial fibrillation: She is doing well on metoprolol with no recurrent symptoms.   She is tolerating anticoagulation with Eliquis with no bleeding complications.   Chads vas score: 2.   2. Hyperlipidemia: Continue rosuvastatin.  Most recent LDL was 79.  This was better in the past and I suspect her weight gain has something to do with this.  I discussed with her  the importance of weight loss.   Disposition:   FU with me in 6 months.  Signed,  Kathlyn Sacramento, MD  08/05/2018 4:21 PM    [place]

## 2018-08-13 ENCOUNTER — Ambulatory Visit (INDEPENDENT_AMBULATORY_CARE_PROVIDER_SITE_OTHER): Payer: Medicare Other | Admitting: Surgical

## 2018-08-13 ENCOUNTER — Encounter: Payer: Self-pay | Admitting: Family Medicine

## 2018-08-13 ENCOUNTER — Encounter

## 2018-08-13 DIAGNOSIS — Z23 Encounter for immunization: Secondary | ICD-10-CM | POA: Diagnosis not present

## 2018-08-29 ENCOUNTER — Telehealth: Payer: Self-pay | Admitting: Cardiovascular Disease

## 2018-08-29 NOTE — Telephone Encounter (Signed)
I left a message on the patient's voice mail (ok per DPR) that it is ok to take plain zyrtec, but to avoid zyrtec D due to the possibility of causing her heart to race.   I asked that she call back with any questions.

## 2018-08-29 NOTE — Telephone Encounter (Signed)
Pt is out of town and is getting ready to fly back Tuesday. Please advise if it is ok if she takes Zyrtec D before she flies.

## 2018-10-29 ENCOUNTER — Other Ambulatory Visit: Payer: Self-pay | Admitting: Cardiovascular Disease

## 2018-10-29 MED ORDER — APIXABAN 5 MG PO TABS: 5.0000 mg | ORAL_TABLET | Freq: Two times a day (BID) | ORAL | 1 refills | Status: DC

## 2018-10-29 NOTE — Telephone Encounter (Signed)
°*  STAT* If patient is at the pharmacy, call can be transferred to refill team.   1. Which medications need to be refilled? (please list name of each medication and dose if known) Eliquis 5 MG - 1 tablet 2 times daily   2. Which pharmacy/location (including street and city if local pharmacy) is medication to be sent to? [facility] on [address] in [place]   3. Do they need a 30 day or 90 day supply? 90 day

## 2018-10-29 NOTE — Telephone Encounter (Signed)
Last ov w/ MA 10/19, wt 112.7 kg, age 73, serum creatinine 0.68 (10/31/17), CrCl 135.01. Eliquis 5 mg BID #180 w/ 1 refill sent to [address]

## 2018-10-29 NOTE — Telephone Encounter (Signed)
Please review for refill.  

## 2018-11-27 ENCOUNTER — Telehealth: Payer: Self-pay | Admitting: Cardiovascular Disease

## 2018-11-27 NOTE — Telephone Encounter (Signed)
Patient calling  States that she would like to know if she will be able to take Cepacol extra strength cough drops Knows that there are certain medications that you are not allowed to take with cardiac medications Would like to clarify Please call to discuss

## 2018-11-28 NOTE — Telephone Encounter (Signed)
Left voicemail message to call back  

## 2018-11-28 NOTE — Telephone Encounter (Signed)
Spoke with patient and reviewed that those lozenges would be fine but that she can always check with her pharmacist as well. She was very appreciative for the call and had no further questions at this time.

## 2018-12-12 ENCOUNTER — Encounter: Payer: Medicare Other | Admitting: Family Medicine

## 2018-12-15 LAB — HM MAMMOGRAPHY

## 2018-12-16 ENCOUNTER — Encounter: Payer: Self-pay | Admitting: Family Medicine

## 2018-12-16 ENCOUNTER — Ambulatory Visit (INDEPENDENT_AMBULATORY_CARE_PROVIDER_SITE_OTHER): Payer: Medicare Other | Admitting: Family Medicine

## 2018-12-16 VITALS — BP 120/80 | HR 88 | Temp 97.8°F | Ht 64.0 in | Wt 253.6 lb

## 2018-12-16 DIAGNOSIS — Z0000 Encounter for general adult medical examination without abnormal findings: Secondary | ICD-10-CM

## 2018-12-16 DIAGNOSIS — E785 Hyperlipidemia, unspecified: Secondary | ICD-10-CM

## 2018-12-16 DIAGNOSIS — Z6841 Body Mass Index (BMI) 40.0 and over, adult: Secondary | ICD-10-CM

## 2018-12-16 DIAGNOSIS — J432 Centrilobular emphysema: Secondary | ICD-10-CM | POA: Diagnosis not present

## 2018-12-16 DIAGNOSIS — I480 Paroxysmal atrial fibrillation: Secondary | ICD-10-CM

## 2018-12-16 LAB — URINALYSIS, ROUTINE W REFLEX MICROSCOPIC
Bilirubin Urine: NEGATIVE
Hgb urine dipstick: NEGATIVE
Ketones, ur: NEGATIVE
Nitrite: NEGATIVE
Specific Gravity, Urine: 1.02 (ref 1.000–1.030)
TOTAL PROTEIN, URINE-UPE24: NEGATIVE
UROBILINOGEN UA: 0.2 (ref 0.0–1.0)
Urine Glucose: NEGATIVE
pH: 7 (ref 5.0–8.0)

## 2018-12-16 LAB — COMPREHENSIVE METABOLIC PANEL
ALT: 20 U/L (ref 0–35)
AST: 20 U/L (ref 0–37)
Albumin: 4.1 g/dL (ref 3.5–5.2)
Alkaline Phosphatase: 110 U/L (ref 39–117)
BUN: 24 mg/dL — ABNORMAL HIGH (ref 6–23)
CO2: 27 mEq/L (ref 19–32)
Calcium: 9.3 mg/dL (ref 8.4–10.5)
Chloride: 105 mEq/L (ref 96–112)
Creatinine, Ser: 0.65 mg/dL (ref 0.40–1.20)
GFR: 89.59 mL/min (ref >60.00)
Glucose, Bld: 95 mg/dL (ref 70–99)
Potassium: 4.1 mEq/L (ref 3.5–5.1)
Sodium: 139 mEq/L (ref 135–145)
Total Bilirubin: 0.6 mg/dL (ref 0.2–1.2)
Total Protein: 7.1 g/dL (ref 6.0–8.3)

## 2018-12-16 LAB — LIPID PANEL
CHOL/HDL RATIO: 3
CHOLESTEROL: 139 mg/dL (ref 0–200)
HDL: 53 mg/dL (ref >39.00)
LDL CALC: 70 mg/dL (ref 0–99)
NONHDL: 86.18
Triglycerides: 80 mg/dL (ref 0.0–149.0)
VLDL: 16 mg/dL (ref 0.0–40.0)

## 2018-12-16 LAB — CBC
HEMATOCRIT: 42.1 % (ref 36.0–46.0)
HEMOGLOBIN: 14 g/dL (ref 12.0–15.0)
MCHC: 33.2 g/dL (ref 30.0–36.0)
MCV: 95.5 fl (ref 78.0–100.0)
PLATELETS: 163 10*3/uL (ref 150.0–400.0)
RBC: 4.41 Mil/uL (ref 3.87–5.11)
RDW: 13.3 % (ref 11.5–15.5)
WBC: 5.5 10*3/uL (ref 4.0–10.5)

## 2018-12-16 NOTE — Addendum Note (Signed)
Addended by: Francis Dowse T on: 12/16/2018 02:39 PM   Modules accepted: Orders

## 2018-12-16 NOTE — Progress Notes (Signed)
Phone: [phone]   Subjective:  Patient presents today for their annual physical. Chief complaint-noted.   See problem oriented charting- ROS- full  review of systems was completed and negative except for: some seasonal allergies  The following were reviewed and entered/updated in epic: Past Medical History:  Diagnosis Date  . Allergy   . ASCUS on Pap smear 2007  . ASTHMA UNSPECIFIED WITH EXACERBATION 11/08/2008  . Cancer of skin of left ear   . Chest pain 03/2011   negative stress test - Dr. Ron Parker  . History of frequent urinary tract infections   . History of kidney stones   . [place], [place] 02/09/2009  . Kidney stones   . OSTEOARTHRITIS 04/24/2007  . Osteopenia   . Osteoporosis   . Pneumonia   . PONV (postoperative nausea and vomiting)   . SHINGLES 11/08/2008   had vaccine at drugstore 12/14  . VARICOSE VEINS LOWER EXTREMITIES W/INFLAMMATION 02/05/2008   Patient Active Problem List   Diagnosis Date Noted  . Paroxysmal atrial fibrillation ([place]) 12/11/2017    Priority: High  . Atypical chest pain 12/27/2014    Priority: Medium  . Hyperlipidemia 02/09/2009    Priority: Medium  . COPD (chronic obstructive pulmonary disease) ([place]) 02/05/2008    Priority: Medium  . S/P right TKA 08/07/2016    Priority: Low  . Dysphagia 11/14/2015    Priority: Low  . Obesity, unspecified 06/17/2014    Priority: Low  . Kidney stones 06/17/2014    Priority: Low  . Osteopenia     Priority: Low  . Allergic rhinitis 05/07/2008    Priority: Low  . VARICOSE VEINS LOWER EXTREMITIES W/INFLAMMATION 02/05/2008    Priority: Low  . Osteoarthritis 04/24/2007    Priority: Low   Past Surgical History:  Procedure Laterality Date  . Bone spur shoulder     x2 removed  . COLPOSCOPY     Neg HR HPV  . KNEE ARTHROSCOPY    . laser surgery per left ey     secondary to film covering   . lens implants- both eyes    . SPINE SURGERY  2008   lumbar  . TOTAL KNEE ARTHROPLASTY Right  08/07/2016   Procedure: RIGHT TOTAL KNEE ARTHROPLASTY;  Surgeon: Paralee Cancel, MD;  Location: [facility];  Service: Orthopedics;  Laterality: Right;    Family History  Problem Relation Age of Onset  . Ovarian cancer Mother   . Melanoma Mother   . Hypertension Father   . Heart disease Father        MI 47  . Heart disease Paternal Grandfather   . Cancer Maternal Grandmother        Oral cancer  . Down syndrome Sister   . Prostate cancer Maternal Uncle   . Heart disease Maternal Grandfather   . Prostate cancer Maternal Uncle   . Prostate cancer Maternal Uncle   . Breast cancer Cousin        two maternal cousins diagnosed <50  . Diabetes Paternal Aunt   . Sudden Cardiac Death Son        unclear cause.   . Colon cancer Neg Hx   . Esophageal cancer Neg Hx   . Rectal cancer Neg Hx   . Stomach cancer Neg Hx     Medications- reviewed and updated Current Outpatient Medications  Medication Sig Dispense Refill  . apixaban (ELIQUIS) 5 MG TABS tablet Take 1 tablet (5 mg total) by mouth 2 (two) times daily. 180 tablet 1  .  Calcium Citrate 250 MG TABS Take 1,000 mg by mouth 2 (two) times daily.    . cetirizine (ZYRTEC ALLERGY) 10 MG tablet Take 10 mg by mouth at bedtime.      . Cholecalciferol (VITAMIN D3) 2000 UNITS TABS Take 2,000 Units by mouth 2 (two) times daily.      . fish oil-omega-3 fatty acids 1000 MG capsule Take 2 capsules (2 g total) by mouth 2 (two) times daily. total for 4,000 mg (Patient taking differently: Take 1 g by mouth daily. )    . FLOVENT HFA 110 MCG/ACT inhaler INHALE 2 PUFFS INTO LUNGS TWICE DAILY 12 g 3  . GLUCOSAMINE SULFATE PO Take 2 tablets by mouth 2 (two) times daily.     . metoprolol tartrate (LOPRESSOR) 25 MG tablet Take 1 tablet (25 mg total) by mouth 2 (two) times daily. 180 tablet 3  . Multiple Vitamin (MULTIVITAMIN) tablet Take 1 tablet by mouth daily.    . Olopatadine HCl (PAZEO) 0.7 % SOLN Apply 1 drop to eye as needed.    . prednisoLONE acetate (PRED  FORTE) 1 % ophthalmic suspension Place 1 drop into both eyes 2 (two) times daily.    Marland Kitchen PROAIR HFA 108 (90 Base) MCG/ACT inhaler INHALE TWO PUFFS INTO LUNGS EVERY 6 HOURS AS NEEDED FOR WHEEZING FOR SHORTNESS OF BREATH 9 each 5  . rosuvastatin (CRESTOR) 10 MG tablet TAKE 1 TABLET BY MOUTH ONCE DAILY 90 tablet 2   No current facility-administered medications for this visit.     Allergies-reviewed and updated Allergies  Allergen Reactions  . Atorvastatin     Myalgia  . Cefuroxime Axetil Diarrhea and Nausea And Vomiting  . Other     Social History   Social History Narrative   Married 44 years in 2015. 2 kids. 2 grandkids.       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 In [place], got job in [employer]. Retired 3 years ago.       Hobbies: works with grandsons in [place], Haematologist (hates housework), goes on 1 month motorcyle biking trip each year   Objective  Objective:  BP 120/80 (BP Location: Left Arm, Patient Position: Sitting, Cuff Size: Large)   Pulse 88   Temp 97.8 F (36.6 C) (Oral)   Ht 5\' 4"  (1.626 m)   Wt 253 lb 9.6 oz (115 kg)   SpO2 94%   BMI 43.53 kg/m  Gen: NAD, resting comfortably HEENT: Mucous membranes are moist. Oropharynx normal Neck: no thyromegaly or cervical lymphadenopathy CV: RRR no murmurs rubs or gallops Lungs: CTAB no crackles, wheeze, rhonchi Abdomen: soft/nontender/nondistended/normal bowel sounds. No rebound or guarding.  Obese Ext: no edema Skin: warm, dry, healing wound over abdomen from recent dermatology visit Neuro: grossly normal, moves all extremities, PERRLA    Assessment and Plan    73 y.o. female presenting for annual physical.  Health Maintenance counseling: 1. Anticipatory guidance: Patient counseled regarding regular dental exams -q6 months, eye exams - yearly in general,  avoiding smoking and second hand smoke , limiting alcohol to 1 beverage per day- about once a month .   2. Risk factor reduction:  Advised patient of need for regular exercise  and diet rich and fruits and vegetables to reduce risk of heart attack and stroke. Exercise- not exercising right now- hoping with better weather can be more active- trying to get into water aerobics. Diet-cooking cajun again and loves bread and whole milk- these have been set backs. Up 12 lbs from last  march- she plans to cut down on portion size.   Wt Readings from Last 3 Encounters:  12/16/18 253 lb 9.6 oz (115 kg)  08/05/18 248 lb 8 oz (112.7 kg)  12/25/17 241 lb (109.3 kg)  3. Immunizations/screenings/ancillary studies-discussed Tdap at pharmacy as well as Shingrix  Immunization History  Administered Date(s) Administered  . Influenza Whole 08/15/2000, 08/06/2008  . Influenza, High Dose Seasonal PF 10/25/2014, 07/16/2016, 07/16/2017, 08/13/2018  . Influenza,inj,Quad PF,6+ Mos 08/01/2015  . PPD Test 07/05/2014  . Pneumococcal Conjugate-13 10/31/2015  . Pneumococcal Polysaccharide-23 08/15/2000, 06/08/2013  . Td 09/14/1998, 02/05/2008  . Zoster 11/06/2013  4. Cervical cancer screening- still sees gynecology- planned next week.  Passed age today screening. Mom died of ovarian cancer so that's one reason she still goes.  5. Breast cancer screening-  breast exam with gynecology and mammogram -last on file from February 2018 [facility] 6. Colon cancer screening - 12/01/2015 with 10-year follow-up/aged out- she states was told last colonoscopy already 7. Skin cancer screening-sees dermatology yearly- saw last week. advised regular sunscreen use. Denies worrisome, changing, or new skin lesions.  8. Birth control/STD check- monogamous and postmenopausal 9. Osteoporosis screening at 3- last done in 2012 with mild osteopenia noted- declines for now- will rediscuss next year -10.  Never smoker  Status of chronic or acute concerns   Atrial fibrillation- appears stable.  On Eliquis twice a day.  Also on metoprolol 25 mg twice a day.  Follows with Dr. Fletcher Anon. Continue current rx  Hyperlipidemia- on  Crestor 10 mg daily.  Update lipids  COPD- on flovent (actually just takes once a day and has done well)  with albuterol prn- very very sparing use so we opted to not increase flovent. From 2nd hand smoke exposure history  OA- s/p right knee replacement several years ago. Quit taking mobic.   Allergies bothering her this year- zyrtec at night. Saline nasal rinse  Future Appointments  Date Time Provider [place]  12/30/2018  9:00 AM Huel Cote, NP [facility]  02/12/2019  9:00 AM [facility] HEALTH COACH [facility]   1 year physical   Lab/Order associations:fasting  Preventative health care - Plan: CBC, Lipid panel, Comprehensive metabolic panel, Urinalysis  Hyperlipidemia, unspecified hyperlipidemia type - Plan: CBC, Lipid panel, Comprehensive metabolic panel, Urinalysis  Paroxysmal atrial fibrillation ([facility])   Return precautions advised.  Garret Reddish, MD

## 2018-12-16 NOTE — Patient Instructions (Addendum)
Health Maintenance Due  Topic Date Due  . TETANUS/TDAP Please stop by your local pharmacy for vaccination 02/04/2018  . MAMMOGRAM Please stop by the front desk to sign a release of information from [facility] 11/15/2018   Please stop by lab before you go If you do not have mychart- we will call you about results within 5 business days of [place] receiving them.  If you have mychart- we will send your results within 3 business days of [place] receiving them.  If abnormal or we want to clarify a result, we will call or mychart you to make sure you receive the message.  If you have questions or concerns or don't hear within 5-7 days, please send [place] a message or call us.    I love your goal of working to get weight back down. Let's set a goal of at least 10 lbs back off next year

## 2018-12-22 ENCOUNTER — Encounter: Payer: Self-pay | Admitting: Family Medicine

## 2018-12-30 ENCOUNTER — Encounter: Payer: Self-pay | Admitting: Women's Health

## 2018-12-30 ENCOUNTER — Ambulatory Visit (INDEPENDENT_AMBULATORY_CARE_PROVIDER_SITE_OTHER): Payer: Medicare Other | Admitting: Women's Health

## 2018-12-30 ENCOUNTER — Other Ambulatory Visit: Payer: Self-pay

## 2018-12-30 VITALS — BP 118/82 | Ht 64.0 in | Wt 251.0 lb

## 2018-12-30 DIAGNOSIS — Z01419 Encounter for gynecological examination (general) (routine) without abnormal findings: Secondary | ICD-10-CM

## 2018-12-30 DIAGNOSIS — Z13820 Encounter for screening for osteoporosis: Secondary | ICD-10-CM | POA: Diagnosis not present

## 2018-12-30 NOTE — Progress Notes (Signed)
[name] 07-09-1946 [id number]    History:    Presents for rest and pelvic exam with no complaints.  Postmenopausal on no HRT with no bleeding.  Normal Pap and mammogram history.  Not sexually active husband's health.  2016-01-16- colonoscopy.  Jan 16, 2016 knee replacement.  Primary care manages COPD, hypercholesteremia, hypertension, cardiologist manages A. fib on Eliquis.  Past medical history, past surgical history, family history and social history were all reviewed and documented in the EPIC chart.  Retired Art therapist.  01/16/2015 son died of an MI at age 24, 68 granddaughter died from a mass shooting.  Husband history of  MI and prostate cancer doing okay.  ROS:  A ROS was performed and pertinent positives and negatives are included.  Exam:  Vitals:   12/30/18 0914  BP: 118/82  Weight: 251 lb (113.9 kg)  Height: 5\' 4"  (1.626 m)   Body mass index is 43.08 kg/m.   General appearance:  Normal Thyroid:  Symmetrical, normal in size, without palpable masses or nodularity. Respiratory  Auscultation:  Clear without wheezing or rhonchi Cardiovascular  Auscultation:  Regular rate, without rubs, murmurs or gallops  Edema/varicosities:  Not grossly evident Abdominal  Soft,nontender, without masses, guarding or rebound.  Liver/spleen:  No organomegaly noted  Hernia:  None appreciated  Skin  Inspection:  Grossly normal   Breasts: Examined lying and sitting.     Right: Without masses, retractions, discharge or axillary adenopathy.     Left: Without masses, retractions, discharge or axillary adenopathy. Gentitourinary   Inguinal/mons:  Normal without inguinal adenopathy  External genitalia:  Normal  BUS/Urethra/Skene's glands:  Normal  Vagina: Atrophic   Cervix:  Normal  Uterus:  normal in size, shape and contour.  Midline and mobile  Adnexa/parametria:     Rt: Without masses or tenderness.   Lt: Without masses or tenderness.  Anus and perineum: Normal  Digital rectal exam: Normal  sphincter tone without palpated masses or tenderness  Assessment/Plan:  [name] deceased for breast and pelvic exam with no complaints.  Postmenopausal/no HRT/no bleeding COPD, hypercholesteremia, hyper tension-primary care manages labs and meds A. fib-cardiologist manages Morbid obesity  Plan: Aware of need to decrease calorie/carbs and increase exercise has had a 10 pound weight gain in the past year.  SBEs, continue annual screening mammogram, calcium rich foods, vitamin D 2000 daily encouraged.  Schedule DEXA, had normal DEXA at orthopedic medicine but did not return for follow-up will schedule here.  Encouraged weightbearing and balance type exercise, yoga encouraged.  Home safety, fall prevention discussed.  Pap screening guidelines reviewed none done.     [place], 10:09 AM 12/30/2018

## 2018-12-30 NOTE — Patient Instructions (Signed)
Health Maintenance After Age 72 After age 72, you are at a higher risk for certain long-term diseases and infections as well as injuries from falls. Falls are a major cause of broken bones and head injuries in people who are older than age 72. Getting regular preventive care can help to keep you healthy and well. Preventive care includes getting regular testing and making lifestyle changes as recommended by your health care provider. Talk with your health care provider about:  Which screenings and tests you should have. A screening is a test that checks for a disease when you have no symptoms.  A diet and exercise plan that is right for you. What should I know about screenings and tests to prevent falls? Screening and testing are the best ways to find a health problem early. Early diagnosis and treatment give you the best chance of managing medical conditions that are common after age 72. Certain conditions and lifestyle choices may make you more likely to have a fall. Your health care provider may recommend:  Regular vision checks. Poor vision and conditions such as cataracts can make you more likely to have a fall. If you wear glasses, make sure to get your prescription updated if your vision changes.  Medicine review. Work with your health care provider to regularly review all of the medicines you are taking, including over-the-counter medicines. Ask your health care provider about any side effects that may make you more likely to have a fall. Tell your health care provider if any medicines that you take make you feel dizzy or sleepy.  Osteoporosis screening. Osteoporosis is a condition that causes the bones to get weaker. This can make the bones weak and cause them to break more easily.  Blood pressure screening. Blood pressure changes and medicines to control blood pressure can make you feel dizzy.  Strength and balance checks. Your health care provider may recommend certain tests to check your  strength and balance while standing, walking, or changing positions.  Foot health exam. Foot pain and numbness, as well as not wearing proper footwear, can make you more likely to have a fall.  Depression screening. You may be more likely to have a fall if you have a fear of falling, feel emotionally low, or feel unable to do activities that you used to do.  Alcohol use screening. Using too much alcohol can affect your balance and may make you more likely to have a fall. What actions can I take to lower my risk of falls? General instructions  Talk with your health care provider about your risks for falling. Tell your health care provider if: ? You fall. Be sure to tell your health care provider about all falls, even ones that seem minor. ? You feel dizzy, sleepy, or off-balance.  Take over-the-counter and prescription medicines only as told by your health care provider. These include any supplements.  Eat a healthy diet and maintain a healthy weight. A healthy diet includes low-fat dairy products, low-fat (lean) meats, and fiber from whole grains, beans, and lots of fruits and vegetables. Home safety  Remove any tripping hazards, such as rugs, cords, and clutter.  Install safety equipment such as grab bars in bathrooms and safety rails on stairs.  Keep rooms and walkways well-lit. Activity   Follow a regular exercise program to stay fit. This will help you maintain your balance. Ask your health care provider what types of exercise are appropriate for you.  If you need a cane or   walker, use it as recommended by your health care provider.  Wear supportive shoes that have nonskid soles. Lifestyle  Do not drink alcohol if your health care provider tells you not to drink.  If you drink alcohol, limit how much you have: ? 0-1 drink a day for women. ? 0-2 drinks a day for men.  Be aware of how much alcohol is in your drink. In the [place] one drink equals one typical bottle of beer (12  oz), one-half glass of wine (5 oz), or one shot of hard liquor (1 oz).  Do not use any products that contain nicotine or tobacco, such as cigarettes and e-cigarettes. If you need help quitting, ask your health care provider. Summary  Having a healthy lifestyle and getting preventive care can help to protect your health and wellness after age 53.  Screening and testing are the best way to find a health problem early and help you avoid having a fall. Early diagnosis and treatment give you the best chance for managing medical conditions that are more common for people who are older than age 95.  Falls are a major cause of broken bones and head injuries in people who are older than age 20. Take precautions to prevent a fall at home.  Work with your health care provider to learn what changes you can make to improve your health and wellness and to prevent falls. This information is not intended to replace advice given to you by your health care provider. Make sure you discuss any questions you have with your health care provider. Document Released: 08/14/2017 Document Revised: 08/14/2017 Document Reviewed: 08/14/2017 Elsevier Interactive Patient Education  2019 [place]. Gout  Gout is painful swelling of your joints. Gout is a type of arthritis. It is caused by having too much uric acid in your body. Uric acid is a chemical that is made when your body breaks down substances called purines. If your body has too much uric acid, sharp crystals can form and build up in your joints. This causes pain and swelling. Gout attacks can happen quickly and be very painful (acute gout). Over time, the attacks can affect more joints and happen more often (chronic gout). What are the causes?  Too much uric acid in your blood. This can happen because: ? Your kidneys do not remove enough uric acid from your blood. ? Your body makes too much uric acid. ? You eat too many foods that are high in purines. These  foods include organ meats, some seafood, and beer.  Trauma or stress. What increases the risk?  Having a family history of gout.  Being female and middle-aged.  Being female and having gone through menopause.  Being very overweight (obese).  Drinking alcohol, especially beer.  Not having enough water in the body (being dehydrated).  Losing weight too quickly.  Having an organ transplant.  Having lead poisoning.  Taking certain medicines.  Having kidney disease.  Having a skin condition called psoriasis. What are the signs or symptoms? An attack of acute gout usually happens in just one joint. The most common place is the big toe. Attacks often start at night. Other joints that may be affected include joints of the feet, ankle, knee, fingers, wrist, or elbow. Symptoms of an attack may include:  Very bad pain.  Warmth.  Swelling.  Stiffness.  Shiny, red, or purple skin.  Tenderness. The affected joint may be very painful to touch.  Chills and fever. Chronic gout may  cause symptoms more often. More joints may be involved. You may also have white or yellow lumps (tophi) on your hands or feet or in other areas near your joints. How is this treated?  Treatment for this condition has two phases: treating an acute attack and preventing future attacks.  Acute gout treatment may include: ? NSAIDs. ? Steroids. These are taken by mouth or injected into a joint. ? Colchicine. This medicine relieves pain and swelling. It can be given by mouth or through an IV tube.  Preventive treatment may include: ? Taking small doses of NSAIDs or colchicine daily. ? Using a medicine that reduces uric acid levels in your blood. ? Making changes to your diet. You may need to see a food expert (dietitian) about what to eat and drink to prevent gout. Follow these instructions at home: During a gout attack   If told, put ice on the painful area: ? Put ice in a plastic bag. ? Place a  towel between your skin and the bag. ? Leave the ice on for 20 minutes, 2-3 times a day.  Raise (elevate) the painful joint above the level of your heart as often as you can.  Rest the joint as much as possible. If the joint is in your leg, you may be given crutches.  Follow instructions from your doctor about what you cannot eat or drink. Avoiding future gout attacks  Eat a low-purine diet. Avoid foods and drinks such as: ? Liver. ? Kidney. ? Anchovies. ? Asparagus. ? Herring. ? Mushrooms. ? Mussels. ? Beer.  Stay at a healthy weight. If you want to lose weight, talk with your doctor. Do not lose weight too fast.  Start or continue an exercise plan as told by your doctor. Eating and drinking  Drink enough fluids to keep your pee (urine) pale yellow.  If you drink alcohol: ? Limit how much you use to:  0-1 drink a day for women.  0-2 drinks a day for men. ? Be aware of how much alcohol is in your drink. In the [place]S., one drink equals one 12 oz bottle of beer (355 mL), one 5 oz glass of wine (148 mL), or one 1 oz glass of hard liquor (44 mL). General instructions  Take over-the-counter and prescription medicines only as told by your doctor.  Do not drive or use heavy machinery while taking prescription pain medicine.  Return to your normal activities as told by your doctor. Ask your doctor what activities are safe for you.  Keep all follow-up visits as told by your doctor. This is important. Contact a doctor if:  You have another gout attack.  You still have symptoms of a gout attack after 10 days of treatment.  You have problems (side effects) because of your medicines.  You have chills or a fever.  You have burning pain when you pee (urinate).  You have pain in your lower back or belly. Get help right away if:  You have very bad pain.  Your pain cannot be controlled.  You cannot pee. Summary  Gout is painful swelling of the joints.  The most common  site of pain is the big toe, but it can affect other joints.  Medicines and avoiding some foods can help to prevent and treat gout attacks. This information is not intended to replace advice given to you by your health care provider. Make sure you discuss any questions you have with your health care provider. Document Released: 07/10/2008  Document Revised: 04/23/2018 Document Reviewed: 04/23/2018 Elsevier Interactive Patient Education  [employer].

## 2019-02-02 ENCOUNTER — Other Ambulatory Visit: Payer: Self-pay | Admitting: Cardiovascular Disease

## 2019-02-02 NOTE — Telephone Encounter (Signed)
Patient due for 6 month F/U. Please call to schedule. Thank you.

## 2019-02-02 NOTE — Telephone Encounter (Signed)
*  STAT* If patient is at the pharmacy, call can be transferred to refill team.   1. Which medications need to be refilled? (please list e of each medication and dose if known) Crestor  2. Which pharmacy/location (including street and city if local pharmacy) is medication to be sent to? [facility] in [place]  3. Do they need a 30 day or 90 day supply? 90 day

## 2019-02-03 NOTE — Telephone Encounter (Signed)
LMOV to schedule f/u with Dr Fletcher Anon

## 2019-02-12 ENCOUNTER — Ambulatory Visit: Payer: Medicare Other

## 2019-03-21 ENCOUNTER — Other Ambulatory Visit: Payer: Self-pay | Admitting: Family Medicine

## 2019-04-20 ENCOUNTER — Other Ambulatory Visit: Payer: Self-pay | Admitting: Cardiovascular Disease

## 2019-04-20 MED ORDER — METOPROLOL TARTRATE 25 MG PO TABS: 25.0000 mg | ORAL_TABLET | Freq: Two times a day (BID) | ORAL | 0 refills | Status: DC

## 2019-04-20 MED ORDER — APIXABAN 5 MG PO TABS: 5.0000 mg | ORAL_TABLET | Freq: Two times a day (BID) | ORAL | 0 refills | Status: DC

## 2019-04-20 MED ORDER — ROSUVASTATIN CALCIUM 10 MG PO TABS: 10.0000 mg | ORAL_TABLET | Freq: Every day | ORAL | 0 refills | Status: DC

## 2019-04-20 NOTE — Telephone Encounter (Signed)
Pt's wt 113.9 kg, age 73, SCr 0.65, CrCl 140.67.

## 2019-04-20 NOTE — Telephone Encounter (Signed)
Please review for refill. Thanks!  

## 2019-04-20 NOTE — Telephone Encounter (Signed)
°*  STAT* If patient is at the pharmacy, call can be transferred to refill team.   1. Which medications need to be refilled? (please list name of each medication and dose if known) Eliquis 5mg  bid, Lopressor 25 mg bid, Crestor 10mg    2. Which pharmacy/location (including street and city if local pharmacy) is medication to be sent to? [facility] in [place]  3. Do they need a 30 day or 90 day supply? [place]

## 2019-04-23 ENCOUNTER — Telehealth: Payer: Self-pay | Admitting: Cardiovascular Disease

## 2019-04-23 NOTE — Telephone Encounter (Signed)

## 2019-04-24 ENCOUNTER — Encounter: Payer: Self-pay | Admitting: Cardiovascular Disease

## 2019-04-24 ENCOUNTER — Ambulatory Visit (INDEPENDENT_AMBULATORY_CARE_PROVIDER_SITE_OTHER): Payer: Medicare Other | Admitting: Cardiovascular Disease

## 2019-04-24 ENCOUNTER — Other Ambulatory Visit: Payer: Self-pay

## 2019-04-24 VITALS — BP 124/80 | HR 68 | Temp 97.8°F | Ht 64.5 in | Wt 250.8 lb

## 2019-04-24 DIAGNOSIS — E785 Hyperlipidemia, unspecified: Secondary | ICD-10-CM

## 2019-04-24 DIAGNOSIS — I480 Paroxysmal atrial fibrillation: Secondary | ICD-10-CM

## 2019-04-24 DIAGNOSIS — Z6841 Body Mass Index (BMI) 40.0 and over, adult: Secondary | ICD-10-CM

## 2019-04-24 NOTE — Progress Notes (Signed)
Cardiology Office Note   Date:  04/24/2019   ID:  [name], [name] December 12, 1945, MRN [id number]  PCP:  Marin Olp, MD  Cardiologist:   Kathlyn Sacramento, MD   Chief Complaint  Patient presents with  . other    6 month follow up. Meds reviewed by the pt. verbally. "doing well."       History of Present Illness: [name] is a 73 y.o. female who presents for a follow-up visit regarding paroxysmal atrial fibrillation.  She has known history of COPD from second hand smoking, and hyperlipidemia. She is a lifelong nonsmoker. She has no history of hypertension or diabetes.  She had a nuclear stress test in 2016 for atypical chest pain and back normal. The patient is on metoprolol and Eliquis.  She is doing well from a cardiac standpoint with no chest pain, shortness of breath or palpitations.  She takes her medications regularly.  No reported side effects with anticoagulation. Her biggest issue continues to be gradual weight gain .  She has not been able to go to the aquatic center for her water aerobics as she used to.  She is also staying home more.   Past Medical History:  Diagnosis Date  . Allergy   . ASCUS on Pap smear 2007  . ASTHMA UNSPECIFIED WITH EXACERBATION 11/08/2008  . Cancer of skin of left ear   . Chest pain 03/2011   negative stress test - Dr. Ron Parker  . History of frequent urinary tract infections   . History of kidney stones   . [place], [place] 02/09/2009  . Kidney stones   . OSTEOARTHRITIS 04/24/2007  . Osteopenia   . Osteoporosis   . Pneumonia   . PONV (postoperative nausea and vomiting)   . SHINGLES 11/08/2008   had vaccine at drugstore 12/14  . VARICOSE VEINS LOWER EXTREMITIES W/INFLAMMATION 02/05/2008    Past Surgical History:  Procedure Laterality Date  . Bone spur shoulder     x2 removed  . COLPOSCOPY     Neg HR HPV  . KNEE ARTHROSCOPY    . laser surgery per left ey     secondary to film covering   . lens implants- both eyes     . SPINE SURGERY  2008   lumbar  . TOTAL KNEE ARTHROPLASTY Right 08/07/2016   Procedure: RIGHT TOTAL KNEE ARTHROPLASTY;  Surgeon: Paralee Cancel, MD;  Location: [facility];  Service: Orthopedics;  Laterality: Right;     Current Outpatient Medications  Medication Sig Dispense Refill  . apixaban (ELIQUIS) 5 MG TABS tablet Take 1 tablet (5 mg total) by mouth 2 (two) times daily. 180 tablet 0  . Calcium Citrate 250 MG TABS Take 1,000 mg by mouth 2 (two) times daily.    . cetirizine (ZYRTEC ALLERGY) 10 MG tablet Take 10 mg by mouth at bedtime.      . Cholecalciferol (VITAMIN D3) 2000 UNITS TABS Take 2,000 Units by mouth 2 (two) times daily.      . fish oil-omega-3 fatty acids 1000 MG capsule Take 2 capsules (2 g total) by mouth 2 (two) times daily. total for 4,000 mg (Patient taking differently: Take 1 g by mouth daily. )    . FLOVENT HFA 110 MCG/ACT inhaler Inhale 2 puffs by mouth twice daily 12 g 0  . GLUCOSAMINE SULFATE PO Take 2 tablets by mouth 2 (two) times daily.     . metoprolol tartrate (LOPRESSOR) 25 MG tablet Take 1 tablet (25 mg  total) by mouth 2 (two) times daily. 180 tablet 0  . Multiple Vitamin (MULTIVITAMIN) tablet Take 1 tablet by mouth daily.    . Olopatadine HCl (PAZEO) 0.7 % SOLN Apply 1 drop to eye as needed.    . prednisoLONE acetate (PRED FORTE) 1 % ophthalmic suspension Place 1 drop into both eyes 2 (two) times daily.    Marland Kitchen PROAIR HFA 108 (90 Base) MCG/ACT inhaler INHALE TWO PUFFS INTO LUNGS EVERY 6 HOURS AS NEEDED FOR WHEEZING FOR SHORTNESS OF BREATH 9 each 5  . rosuvastatin (CRESTOR) 10 MG tablet Take 1 tablet (10 mg total) by mouth daily. 90 tablet 0   No current facility-administered medications for this visit.     Allergies:   Atorvastatin, Cefuroxime axetil, and Other    Social History:  The patient  reports that she is a non-smoker but has been exposed to tobacco smoke. She has never used smokeless tobacco. She reports current alcohol use of about 1.0 standard drinks  of alcohol per week. She reports that she does not use drugs.   Family History:  The patient's family history includes Breast cancer in her cousin; Cancer in her maternal grandmother; Diabetes in her paternal aunt; Down syndrome in her sister; Heart disease in her father, maternal grandfather, and paternal grandfather; Hypertension in her father; Melanoma in her mother; Ovarian cancer in her mother; Prostate cancer in her maternal uncle, maternal uncle, and maternal uncle; Sudden Cardiac Death in her son.    ROS:  Please see the history of present illness.   Otherwise, review of systems are positive for none.   All other systems are reviewed and negative.    PHYSICAL EXAM: VS:  BP 124/80 (BP Location: Left Arm, Patient Position: Sitting, Cuff Size: Normal)   Pulse 68   Temp 97.8 F (36.6 C)   Ht 5' 4.5" (1.638 m)   Wt 250 lb 12 oz (113.7 kg)   BMI 42.38 kg/m  , BMI Body mass index is 42.38 kg/m. GEN: Well nourished, well developed, in no acute distress  HEENT: normal  Neck: no JVD, carotid bruits, or masses Cardiac: RRR; no murmurs, rubs, or gallops,no edema  Respiratory:  clear to auscultation bilaterally, normal work of breathing GI: soft, nontender, nondistended, + BS MS: no deformity or atrophy  Skin: warm and dry, no rash Neuro:  Strength and sensation are intact Psych: euthymic mood, full affect   EKG:  EKG is ordered today. The ekg ordered today demonstrates normal sinus rhythm with low voltage.   Recent Labs: 12/16/2018: ALT 20; BUN 24; Creatinine, Ser 0.65; Hemoglobin 14.0; Platelets 163.0; Potassium 4.1; Sodium 139    Lipid Panel    Component Value Date/Time   CHOL 139 12/16/2018 0938   CHOL 221 (H) 06/17/2015 0856   TRIG 80.0 12/16/2018 0938   HDL 53.00 12/16/2018 0938   HDL 67 06/17/2015 0856   CHOLHDL 3 12/16/2018 0938   VLDL 16.0 12/16/2018 0938   LDLCALC 70 12/16/2018 0938   LDLCALC 142 (H) 06/17/2015 0856   LDLDIRECT 149.5 10/24/2012 0831      Wt  Readings from Last 3 Encounters:  04/24/19 250 lb 12 oz (113.7 kg)  12/30/18 251 lb (113.9 kg)  12/16/18 253 lb 9.6 oz (115 kg)       No flowsheet data found.    ASSESSMENT AND PLAN:  1.   Paroxysmal atrial fibrillation: She is doing well on metoprolol with no recurrent symptoms.   She is tolerating anticoagulation with Eliquis with  no bleeding complications.  Chads vas score: 2.  I reviewed her labs done in March which were unremarkable.  2. Hyperlipidemia: Continue rosuvastatin.  Most recent LDL was 70.    3.  Obesity: She continues to gradually weight gain.  I discussed with her the importance of healthy diet, decreasing portions and increasing her physical activities.   Disposition:   FU with me in 12 months.  Signed,  Kathlyn Sacramento, MD  04/24/2019 2:00 PM    [place]

## 2019-04-24 NOTE — Patient Instructions (Signed)
Medication Instructions:  Your physician recommends that you continue on your current medications as directed. Please refer to the Current Medication list given to you today.  If you need a refill on your cardiac medications before your next appointment, please call your pharmacy.   Lab work: None ordered If you have labs (blood work) drawn today and your tests are completely normal, you will receive your results only by: Marland Kitchen MyChart Message (if you have MyChart) OR . A paper copy in the mail If you have any lab test that is abnormal or we need to change your treatment, we will call you to review the results.  Testing/Procedures: None ordered  Follow-Up: At [facility], you and your health needs are our priority.  As part of our continuing mission to provide you with exceptional heart care, we have created designated Provider Care Teams.  These Care Teams include your primary Cardiologist (physician) and Advanced Practice Providers (APPs -  Physician Assistants and Nurse Practitioners) who all work together to provide you with the care you need, when you need it. You will need a follow up appointment in 12 months.  Please call our office 2 months in advance to schedule this appointment.  You may see  Dr.Arida  or one of the following Advanced Practice Providers on your designated Care Team:   Murray Hodgkins, NP Christell Faith, PA-C . Marrianne Mood, PA-C

## 2019-05-09 ENCOUNTER — Other Ambulatory Visit: Payer: Self-pay | Admitting: Family Medicine

## 2019-05-21 ENCOUNTER — Telehealth: Payer: Self-pay | Admitting: Family Medicine

## 2019-05-21 NOTE — Telephone Encounter (Signed)
Called patient to rescheduled AWV, but no answer. Will try to call patient at a later time. SF

## 2019-06-15 ENCOUNTER — Other Ambulatory Visit: Payer: Self-pay | Admitting: Family Medicine

## 2019-07-27 ENCOUNTER — Other Ambulatory Visit: Payer: Self-pay | Admitting: Cardiovascular Disease

## 2019-07-27 MED ORDER — METOPROLOL TARTRATE 25 MG PO TABS: 25.0000 mg | ORAL_TABLET | Freq: Two times a day (BID) | ORAL | 2 refills | Status: DC

## 2019-07-27 MED ORDER — APIXABAN 5 MG PO TABS: 5.0000 mg | ORAL_TABLET | Freq: Two times a day (BID) | ORAL | 1 refills | Status: DC

## 2019-07-27 NOTE — Addendum Note (Signed)
Addended by: Dede Query R on: 07/27/2019 11:04 AM   Modules accepted: Orders

## 2019-07-27 NOTE — Telephone Encounter (Signed)
°*  STAT* If patient is at the pharmacy, call can be transferred to refill team.   1. Which medications need to be refilled? (please list name of each medication and dose if known)    Metoprolol 25 mg po BID   Eliquis 5 mg po BID  2. Which pharmacy/location (including street and city if local pharmacy) is medication to be sent to?   [facility] on [address] in [place]   3. Do they need a 30 day or 90 day supply?  [place]

## 2019-07-27 NOTE — Telephone Encounter (Signed)
Eliquis refill request.

## 2019-07-27 NOTE — Telephone Encounter (Signed)
Pt's age 73, wt 113.7 kg, SCr 0.65, CrCl 140.42, last ov w/ MA 04/24/19. Refill sent in as requested.

## 2019-07-27 NOTE — Telephone Encounter (Signed)
Requested Prescriptions   Signed Prescriptions Disp Refills  . metoprolol tartrate (LOPRESSOR) 25 MG tablet 180 tablet 2    Sig: Take 1 tablet (25 mg total) by mouth 2 (two) times daily.    Authorizing Provider: Kathlyn Sacramento A    Ordering User: Raelene Bott, Amayia Ciano L

## 2019-07-29 ENCOUNTER — Telehealth: Payer: Self-pay | Admitting: Family Medicine

## 2019-07-29 NOTE — Telephone Encounter (Signed)
I left a message asking the patient to call and schedule Medicare AWV with Courtney ([facility] Health Coach).  If patient calls back, please schedule Medicare Wellness Visit at next available opening.  VDM (Dee-Dee) °

## 2019-08-05 ENCOUNTER — Other Ambulatory Visit: Payer: Self-pay | Admitting: Cardiovascular Disease

## 2019-10-18 ENCOUNTER — Other Ambulatory Visit: Payer: Self-pay | Admitting: Family Medicine

## 2019-11-26 ENCOUNTER — Other Ambulatory Visit: Payer: Self-pay | Admitting: Family Medicine

## 2019-12-16 DIAGNOSIS — L814 Other melanin hyperpigmentation: Secondary | ICD-10-CM | POA: Diagnosis not present

## 2019-12-16 DIAGNOSIS — L821 Other seborrheic keratosis: Secondary | ICD-10-CM | POA: Diagnosis not present

## 2019-12-16 DIAGNOSIS — L578 Other skin changes due to chronic exposure to nonionizing radiation: Secondary | ICD-10-CM | POA: Diagnosis not present

## 2019-12-16 DIAGNOSIS — Z85828 Personal history of other malignant neoplasm of skin: Secondary | ICD-10-CM | POA: Diagnosis not present

## 2019-12-16 DIAGNOSIS — Z86018 Personal history of other benign neoplasm: Secondary | ICD-10-CM | POA: Diagnosis not present

## 2019-12-16 DIAGNOSIS — D225 Melanocytic nevi of trunk: Secondary | ICD-10-CM | POA: Diagnosis not present

## 2019-12-21 ENCOUNTER — Encounter: Payer: Medicare Other | Admitting: Family Medicine

## 2019-12-21 DIAGNOSIS — Z1231 Encounter for screening mammogram for malignant neoplasm of breast: Secondary | ICD-10-CM | POA: Diagnosis not present

## 2019-12-21 LAB — HM MAMMOGRAPHY

## 2019-12-22 ENCOUNTER — Ambulatory Visit (INDEPENDENT_AMBULATORY_CARE_PROVIDER_SITE_OTHER): Payer: Medicare PPO | Admitting: Family Medicine

## 2019-12-22 ENCOUNTER — Other Ambulatory Visit: Payer: Self-pay

## 2019-12-22 ENCOUNTER — Encounter: Payer: Self-pay | Admitting: Family Medicine

## 2019-12-22 ENCOUNTER — Ambulatory Visit (INDEPENDENT_AMBULATORY_CARE_PROVIDER_SITE_OTHER): Payer: Medicare PPO

## 2019-12-22 VITALS — BP 122/70 | HR 72 | Temp 97.4°F | Ht 64.5 in | Wt 257.0 lb

## 2019-12-22 VITALS — BP 122/70 | HR 72 | Temp 97.4°F | Ht 65.0 in | Wt 257.1 lb

## 2019-12-22 DIAGNOSIS — J432 Centrilobular emphysema: Secondary | ICD-10-CM | POA: Diagnosis not present

## 2019-12-22 DIAGNOSIS — Z131 Encounter for screening for diabetes mellitus: Secondary | ICD-10-CM

## 2019-12-22 DIAGNOSIS — E785 Hyperlipidemia, unspecified: Secondary | ICD-10-CM | POA: Diagnosis not present

## 2019-12-22 DIAGNOSIS — Z0000 Encounter for general adult medical examination without abnormal findings: Secondary | ICD-10-CM

## 2019-12-22 DIAGNOSIS — Z6841 Body Mass Index (BMI) 40.0 and over, adult: Secondary | ICD-10-CM | POA: Diagnosis not present

## 2019-12-22 DIAGNOSIS — M8588 Other specified disorders of bone density and structure, other site: Secondary | ICD-10-CM | POA: Diagnosis not present

## 2019-12-22 DIAGNOSIS — I480 Paroxysmal atrial fibrillation: Secondary | ICD-10-CM | POA: Diagnosis not present

## 2019-12-22 DIAGNOSIS — H3530 Unspecified macular degeneration: Secondary | ICD-10-CM | POA: Diagnosis not present

## 2019-12-22 LAB — CBC WITH DIFFERENTIAL/PLATELET
Basophils Absolute: 0.1 10*3/uL (ref 0.0–0.1)
Basophils Relative: 1.2 % (ref 0.0–3.0)
Eosinophils Absolute: 0.1 10*3/uL (ref 0.0–0.7)
Eosinophils Relative: 1.9 % (ref 0.0–5.0)
HCT: 41 % (ref 36.0–46.0)
Hemoglobin: 13.8 g/dL (ref 12.0–15.0)
Lymphocytes Relative: 19.9 % (ref 12.0–46.0)
Lymphs Abs: 1 10*3/uL (ref 0.7–4.0)
MCHC: 33.5 g/dL (ref 30.0–36.0)
MCV: 96.2 fl (ref 78.0–100.0)
Monocytes Absolute: 0.5 10*3/uL (ref 0.1–1.0)
Monocytes Relative: 9.7 % (ref 3.0–12.0)
Neutro Abs: 3.3 10*3/uL (ref 1.4–7.7)
Neutrophils Relative %: 67.3 % (ref 43.0–77.0)
Platelets: 172 10*3/uL (ref 150.0–400.0)
RBC: 4.26 Mil/uL (ref 3.87–5.11)
RDW: 13.1 % (ref 11.5–15.5)
WBC: 4.9 10*3/uL (ref 4.0–10.5)

## 2019-12-22 LAB — LIPID PANEL
Cholesterol: 143 mg/dL (ref 0–200)
HDL: 52.8 mg/dL (ref >39.00)
LDL Cholesterol: 67 mg/dL (ref 0–99)
NonHDL: 89.72
Total CHOL/HDL Ratio: 3
Triglycerides: 112 mg/dL (ref 0.0–149.0)
VLDL: 22.4 mg/dL (ref 0.0–40.0)

## 2019-12-22 LAB — COMPREHENSIVE METABOLIC PANEL
ALT: 20 U/L (ref 0–35)
AST: 19 U/L (ref 0–37)
Albumin: 4.1 g/dL (ref 3.5–5.2)
Alkaline Phosphatase: 106 U/L (ref 39–117)
BUN: 21 mg/dL (ref 6–23)
CO2: 31 mEq/L (ref 19–32)
Calcium: 9.3 mg/dL (ref 8.4–10.5)
Chloride: 104 mEq/L (ref 96–112)
Creatinine, Ser: 0.72 mg/dL (ref 0.40–1.20)
GFR: 79.39 mL/min (ref >60.00)
Glucose, Bld: 107 mg/dL — ABNORMAL HIGH (ref 70–99)
Potassium: 4.6 mEq/L (ref 3.5–5.1)
Sodium: 140 mEq/L (ref 135–145)
Total Bilirubin: 0.7 mg/dL (ref 0.2–1.2)
Total Protein: 6.9 g/dL (ref 6.0–8.3)

## 2019-12-22 LAB — HEMOGLOBIN A1C: Hgb A1c MFr Bld: 5.8 % (ref 4.6–6.5)

## 2019-12-22 NOTE — Patient Instructions (Signed)
[name] , Thank you for taking time to come for your Medicare Wellness Visit. I appreciate your ongoing commitment to your health goals. Please review the following plan we discussed and let me know if I can assist you in the future.   Screening recommendations/referrals: Colorectal Screening: up to date; last colonoscopy 12/01/15 Mammogram: up to date; last 12/15/18 Bone Density: recommended; noted that order has been placed by gynecologist   Vision and Dental Exams: Recommended annual ophthalmology exams for early detection of glaucoma and other disorders of the eye Recommended annual dental exams for proper oral hygiene  Vaccinations: Influenza vaccine: completed 07/30/19 Pneumococcal vaccine: up to date; last 10/31/15 Tdap vaccine: up to date; last 06/16/19 Shingles vaccine:  You may receive this vaccine at your local pharmacy. (see handout)   Advanced directives: Please bring a copy of your POA (Power of Attorney) and/or Living Will to your next appointment.  Goals: Recommend to drink at least 6-8 8oz glasses of water per day and consume a balanced diet rich in fresh fruits and vegetables.   Next appointment: Please schedule your Annual Wellness Visit with your Nurse Health Advisor in one year.  Preventive Care 73 Years and Older, Female Preventive care refers to lifestyle choices and visits with your health care provider that can promote health and wellness. What does preventive care include?  A yearly physical exam. This is also called an annual well check.  Dental exams once or twice a year.  Routine eye exams. Ask your health care provider how often you should have your eyes checked.  Personal lifestyle choices, including:  Daily care of your teeth and gums.  Regular physical activity.  Eating a healthy diet.  Avoiding tobacco and drug use.  Limiting alcohol use.  Practicing safe sex.  Taking low-dose aspirin every day if recommended by your health care  provider.  Taking vitamin and mineral supplements as recommended by your health care provider. What happens during an annual well check? The services and screenings done by your health care provider during your annual well check will depend on your age, overall health, lifestyle risk factors, and family history of disease. Counseling  Your health care provider may ask you questions about your:  Alcohol use.  Tobacco use.  Drug use.  Emotional well-being.  Home and relationship well-being.  Sexual activity.  Eating habits.  History of falls.  Memory and ability to understand (cognition).  Work and work Statistician.  Reproductive health. Screening  You may have the following tests or measurements:  Height, weight, and BMI.  Blood pressure.  Lipid and cholesterol levels. These may be checked every 5 years, or more frequently if you are over 62 years old.  Skin check.  Lung cancer screening. You may have this screening every year starting at age 50 if you have a 30-pack-year history of smoking and currently smoke or have quit within the past 15 years.  Fecal occult blood test (FOBT) of the stool. You may have this test every year starting at age 18.  Flexible sigmoidoscopy or colonoscopy. You may have a sigmoidoscopy every 5 years or a colonoscopy every 10 years starting at age 77.  Hepatitis C blood test.  Hepatitis B blood test.  Sexually transmitted disease (STD) testing.  Diabetes screening. This is done by checking your blood sugar (glucose) after you have not eaten for a while (fasting). You may have this done every 1-3 years.  Bone density scan. This is done to screen for osteoporosis. You  may have this done starting at age 18.  Mammogram. This may be done every 1-2 years. Talk to your health care provider about how often you should have regular mammograms. Talk with your health care provider about your test results, treatment options, and if necessary, the  need for more tests. Vaccines  Your health care provider may recommend certain vaccines, such as:  Influenza vaccine. This is recommended every year.  Tetanus, diphtheria, and acellular pertussis (Tdap, Td) vaccine. You may need a Td booster every 10 years.  Zoster vaccine. You may need this after age 34.  Pneumococcal 13-valent conjugate (PCV13) vaccine. One dose is recommended after age 42.  Pneumococcal polysaccharide (PPSV23) vaccine. One dose is recommended after age 14. Talk to your health care provider about which screenings and vaccines you need and how often you need them. This information is not intended to replace advice given to you by your health care provider. Make sure you discuss any questions you have with your health care provider. Document Released: 10/28/2015 Document Revised: 06/20/2016 Document Reviewed: 08/02/2015 Elsevier Interactive Patient Education  2017 [employer] Prevention in the Home Falls can cause injuries. They can happen to people of all ages. There are many things you can do to make your home safe and to help prevent falls. What can I do on the outside of my home?  Regularly fix the edges of walkways and driveways and fix any cracks.  Remove anything that might make you trip as you walk through a door, such as a raised step or threshold.  Trim any bushes or trees on the path to your home.  Use bright outdoor lighting.  Clear any walking paths of anything that might make someone trip, such as rocks or tools.  Regularly check to see if handrails are loose or broken. Make sure that both sides of any steps have handrails.  Any raised decks and porches should have guardrails on the edges.  Have any leaves, snow, or ice cleared regularly.  Use sand or salt on walking paths during winter.  Clean up any spills in your garage right away. This includes oil or grease spills. What can I do in the bathroom?  Use night lights.  Install grab  bars by the toilet and in the tub and shower. Do not use towel bars as grab bars.  Use non-skid mats or decals in the tub or shower.  If you need to sit down in the shower, use a plastic, non-slip stool.  Keep the floor dry. Clean up any water that spills on the floor as soon as it happens.  Remove soap buildup in the tub or shower regularly.  Attach bath mats securely with double-sided non-slip rug tape.  Do not have throw rugs and other things on the floor that can make you trip. What can I do in the bedroom?  Use night lights.  Make sure that you have a light by your bed that is easy to reach.  Do not use any sheets or blankets that are too big for your bed. They should not hang down onto the floor.  Have a firm chair that has side arms. You can use this for support while you get dressed.  Do not have throw rugs and other things on the floor that can make you trip. What can I do in the kitchen?  Clean up any spills right away.  Avoid walking on wet floors.  Keep items that you use a lot  in easy-to-reach places.  If you need to reach something above you, use a strong step stool that has a grab bar.  Keep electrical cords out of the way.  Do not use floor polish or wax that makes floors slippery. If you must use wax, use non-skid floor wax.  Do not have throw rugs and other things on the floor that can make you trip. What can I do with my stairs?  Do not leave any items on the stairs.  Make sure that there are handrails on both sides of the stairs and use them. Fix handrails that are broken or loose. Make sure that handrails are as long as the stairways.  Check any carpeting to make sure that it is firmly attached to the stairs. Fix any carpet that is loose or worn.  Avoid having throw rugs at the top or bottom of the stairs. If you do have throw rugs, attach them to the floor with carpet tape.  Make sure that you have a light switch at the top of the stairs and the  bottom of the stairs. If you do not have them, ask someone to add them for you. What else can I do to help prevent falls?  Wear shoes that:  Do not have high heels.  Have rubber bottoms.  Are comfortable and fit you well.  Are closed at the toe. Do not wear sandals.  If you use a stepladder:  Make sure that it is fully opened. Do not climb a closed stepladder.  Make sure that both sides of the stepladder are locked into place.  Ask someone to hold it for you, if possible.  Clearly mark and make sure that you can see:  Any grab bars or handrails.  First and last steps.  Where the edge of each step is.  Use tools that help you move around (mobility aids) if they are needed. These include:  Canes.  Walkers.  Scooters.  Crutches.  Turn on the lights when you go into a dark area. Replace any light bulbs as soon as they burn out.  Set up your furniture so you have a clear path. Avoid moving your furniture around.  If any of your floors are uneven, fix them.  If there are any pets around you, be aware of where they are.  Review your medicines with your doctor. Some medicines can make you feel dizzy. This can increase your chance of falling. Ask your doctor what other things that you can do to help prevent falls. This information is not intended to replace advice given to you by your health care provider. Make sure you discuss any questions you have with your health care provider. Document Released: 07/28/2009 Document Revised: 03/08/2016 Document Reviewed: 11/05/2014 Elsevier Interactive Patient Education  2017 [employer].

## 2019-12-22 NOTE — Progress Notes (Signed)
Subjective:   [name] is a 74 y.o. female who presents for Medicare Annual (Subsequent) preventive examination.  Review of Systems:   Cardiac Risk Factors include: advanced age (>83men, >66 women);obesity (BMI >30kg/m2);dyslipidemia     Objective:     Vitals: BP 122/70   Pulse 72   Temp (!) 97.4 F (36.3 C) (Temporal)   Ht 5\' 5"  (1.651 m)   Wt 257 lb 0.9 oz (116.6 kg)   SpO2 98%   BMI 42.78 kg/m   Body mass index is 42.78 kg/m.  Advanced Directives 12/22/2019 10/31/2017 08/07/2016 08/07/2016 07/31/2016 12/01/2015  Does Patient Have a Medical Advance Directive? No No No No No No  Would patient like information on creating a medical advance directive? No - Patient declined - No - patient declined information No - patient declined information No - patient declined information -    Tobacco Social History   Tobacco Use  Smoking Status Passive Smoke Exposure - Never Smoker  Smokeless Tobacco Never Used  Tobacco Comment   last exposure 1999     Counseling given: Not Answered Comment: last exposure 1999   Clinical Intake:  Pre-visit preparation completed: Yes  Pain : No/denies pain  Diabetes: No  How often do you need to have someone help you when you read instructions, pamphlets, or other written materials from your doctor or pharmacy?: 1 - Never  Interpreter Needed?: No  Information entered by :: Denman George LPN  Past Medical History:  Diagnosis Date  . Allergy   . ASCUS on Pap smear 2007  . ASTHMA UNSPECIFIED WITH EXACERBATION 11/08/2008  . Cancer of skin of left ear   . Chest pain 03/2011   negative stress test - Dr. Ron Parker  . History of frequent urinary tract infections   . History of kidney stones   . [place] 02/09/2009  . Kidney stones   . OSTEOARTHRITIS 04/24/2007  . Osteopenia   . Osteoporosis   . Pneumonia   . PONV (postoperative nausea and vomiting)   . SHINGLES 11/08/2008   had vaccine at drugstore 12/14  . VARICOSE  VEINS LOWER EXTREMITIES W/INFLAMMATION 02/05/2008   Past Surgical History:  Procedure Laterality Date  . Bone spur shoulder     x2 removed  . COLPOSCOPY     Neg HR HPV  . KNEE ARTHROSCOPY    . laser surgery per left ey     secondary to film covering   . lens implants- both eyes    . SPINE SURGERY  2008   lumbar  . TOTAL KNEE ARTHROPLASTY Right 08/07/2016   Procedure: RIGHT TOTAL KNEE ARTHROPLASTY;  Surgeon: Paralee Cancel, MD;  Location: [facility];  Service: Orthopedics;  Laterality: Right;   Family History  Problem Relation Age of Onset  . Ovarian cancer Mother   . Melanoma Mother   . Dementia Mother   . Hypertension Father   . Heart disease Father        MI 70  . Heart disease Paternal Grandfather   . Cancer Maternal Grandmother        Oral cancer  . Down syndrome Sister   . Prostate cancer Maternal Uncle   . Heart disease Maternal Grandfather   . Prostate cancer Maternal Uncle   . Prostate cancer Maternal Uncle   . Breast cancer Cousin        two maternal cousins diagnosed <50  . Diabetes Paternal Aunt   . Sudden Cardiac Death Son  unclear cause.   . Colon cancer Neg Hx   . Esophageal cancer Neg Hx   . Rectal cancer Neg Hx   . Stomach cancer Neg Hx    Social History   Socioeconomic History  . Marital status: Married    Spouse name: Not on file  . Number of children: 2  . Years of education: Not on file  . Highest education level: Not on file  Occupational History  . Occupation: Retired    Comment: Pharmacist, hospital   Tobacco Use  . Smoking status: Passive Smoke Exposure - Never Smoker  . Smokeless tobacco: Never Used  . Tobacco comment: last exposure 1999  Substance and Sexual Activity  . Alcohol use: Yes    Alcohol/week: 1.0 standard drinks    Types: 1 Standard drinks or equivalent per week    Comment: monthly  . Drug use: No  . Sexual activity: Not Currently    Birth control/protection: Post-menopausal  Other Topics Concern  . Not on file  Social History  Narrative   Married 44 years in 2015. 2 kids. 2 grandkids-14 and 12 in 2020.       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 Difficulty of Paying Living Expenses: Not on file  Food Insecurity:   . Worried About Charity fundraiser in the Last Year: Not on file  . Ran Out of Food in the Last Year: Not on file  Transportation Needs:   . Lack of Transportation (Medical): Not on file  . Lack of Transportation (Non-Medical): Not on file  Physical Activity:   . Days of Exercise per Week: Not on file  . Minutes of Exercise per Session: Not on file  Stress:   . Feeling of Stress : Not on file  Social Connections:   . Frequency of Communication with Friends and Family: Not on file  . Frequency of Social Gatherings with Friends and Family: Not on file  . Attends Religious Services: Not on file  . Active Member of Clubs or Organizations: Not on file  . Attends Archivist Meetings: Not on file  . Marital Status: Not on file    Outpatient Encounter Medications as of 12/22/2019  Medication Sig  . apixaban (ELIQUIS) 5 MG TABS tablet Take 1 tablet (5 mg total) by mouth 2 (two) times daily.  . Calcium Citrate 250 MG TABS Take 1,000 mg by mouth 2 (two) times daily.  . cetirizine (ZYRTEC ALLERGY) 10 MG tablet Take 10 mg by mouth at bedtime.    . Cholecalciferol (VITAMIN D3) 2000 UNITS TABS Take 2,000 Units by mouth 2 (two) times daily.    . fish oil-omega-3 fatty acids 1000 MG capsule Take 2 capsules (2 g total) by mouth 2 (two) times daily. total for 4,000 mg (Patient taking differently: Take 1 g by mouth daily. )  . FLOVENT HFA 110 MCG/ACT inhaler Inhale 2 puffs by mouth twice daily  . GLUCOSAMINE SULFATE PO Take 2 tablets by mouth 2 (two) times daily.   . metoprolol  tartrate (LOPRESSOR) 25 MG tablet Take 1 tablet (25 mg total) by mouth 2 (two) times daily.  . Multiple Vitamin (MULTIVITAMIN) tablet Take 1 tablet by mouth daily.  Marland Kitchen PROAIR HFA  108 (90 Base) MCG/ACT inhaler INHALE TWO PUFFS INTO LUNGS EVERY 6 HOURS AS NEEDED FOR WHEEZING FOR SHORTNESS OF BREATH  . rosuvastatin (CRESTOR) 10 MG tablet Take 1 tablet (10 mg total) by mouth daily.   No facility-administered encounter medications on file as of 12/22/2019.    Activities of Daily Living In your present state of health, do you have any difficulty performing the following activities: 12/22/2019  Hearing? N  Vision? N  Difficulty concentrating or making decisions? N  Walking or climbing stairs? N  Dressing or bathing? N  Doing errands, shopping? N  Preparing Food and eating ? N  Using the Toilet? N  In the past six months, have you accidently leaked urine? N  Do you have problems with loss of bowel control? N  Managing your Medications? N  Managing your Finances? N  Housekeeping or managing your Housekeeping? N  Some recent data might be hidden    Patient Care Team: Marin Olp, MD as PCP - General (Family Medicine) Huel Cote, NP as Nurse Practitioner (Obstetrics and Gynecology) Wellington Hampshire, MD as Consulting Physician (Cardiology)    Assessment:   This is a routine wellness examination for [place].  Exercise Activities and Dietary recommendations Current Exercise Habits: Home exercise routine, Type of exercise: Other - see comments(yardwork/housework), Time (Minutes): 30, Frequency (Times/Week): 3, Weekly Exercise (Minutes/Week): 90, Intensity: Mild  Goals    . Weight (lb) < 200 lb (90.7 kg)     Stop eating bread and white sugar  Paleo x 3 to 4 days days to wean from sugar  Keep drinking your water !  May try to watch calories x 7 days and weight every day You can use myfitnesspal or calorie king.com          Fall Risk Fall Risk  12/22/2019 12/16/2018 12/11/2017  10/31/2017 11/06/2016  Falls in the past year? 0 0 No No No  Number falls in past yr: 0 0 - - -  Injury with Fall? 0 0 - - -  Follow up Falls evaluation completed;Education provided;Falls prevention discussed - - - -   Is the patient's home free of loose throw rugs in walkways, pet beds, electrical cords, etc?   yes      Grab bars in the bathroom? yes      Handrails on the stairs?   yes      Adequate lighting?   yes  Timed Get Up and Go performed: completed and within normal timeframe; no gait abnormalities noted   Depression Screen PHQ 2/9 Scores 12/22/2019 12/22/2019 12/16/2018 12/11/2017  PHQ - 2 Score 0 0 0 0     Cognitive Function MMSE - Mini Mental State Exam 10/31/2017  Not completed: (No Data)     6CIT Screen 12/22/2019  What Year? 0 points  What month? 0 points  What time? 0 points  Count back from 20 0 points  Months in reverse 0 points  Repeat phrase 0 points  Total Score 0    Immunization History  Administered Date(s) Administered  . Influenza Whole 08/15/2000, 08/06/2008  . Influenza, High Dose Seasonal PF 10/25/2014, 07/16/2016, 07/16/2017, 08/13/2018  . Influenza,inj,Quad PF,6+ Mos 08/01/2015, 07/30/2019  . PFIZER SARS-COV-2 Vaccination 11/19/2019, 12/10/2019  . PPD Test 07/05/2014  . Pneumococcal Conjugate-13 10/31/2015  . Pneumococcal Polysaccharide-23 08/15/2000, 06/08/2013  . Td 09/14/1998, 02/05/2008  . Tdap 06/16/2019  . Zoster 11/06/2013    Qualifies for Shingles Vaccine?Discussed and patient will check with pharmacy for coverage.  Patient education handout provided   Screening Tests Health Maintenance  Topic Date Due  . MAMMOGRAM  12/14/2020  . COLONOSCOPY  11/30/2025  . TETANUS/TDAP  06/15/2029  . INFLUENZA VACCINE  Completed  . DEXA SCAN  Completed  . Hepatitis C Screening  Completed  . PNA vac Low Risk Adult  Completed    Cancer Screenings: Lung: Low Dose CT Chest recommended if Age 20-80 years, 30 pack-year currently smoking OR have quit  w/in 15years. Patient does not qualify. Breast:  Up to date on Mammogram? Yes   Up to date of Bone Density/Dexa? No; ordered today  Colorectal: colonoscopy 12/01/15     Plan:  I have personally reviewed and addressed the Medicare Annual Wellness questionnaire and have noted the following in the patient's chart:  A. Medical and social history B. Use of alcohol, tobacco or illicit drugs  C. Current medications and supplements D. Functional ability and status E.  Nutritional status F.  Physical activity G. Advance directives H. List of other physicians I.  Hospitalizations, surgeries, and ER visits in previous 12 months J.  [place] such as hearing and vision if needed, cognitive and depression L. Referrals, records requested, and appointments- none   In addition, I have reviewed and discussed with patient certain preventive protocols, quality metrics, and best practice recommendations. A written personalized care plan for preventive services as well as general preventive health recommendations were provided to patient.   Signed,  Denman George, LPN  Nurse Health Advisor   Nurse Notes: no additional

## 2019-12-22 NOTE — Progress Notes (Signed)
Phone: [phone]   Subjective:  Patient presents today for their annual physical. Chief complaint-noted.   See problem oriented charting- ROS- full  review of systems was completed and negative except for: activity weight change, weight increase due to sedentary activity, some allergies with some ear congestion, post nasal drip, runny nose, eye problems- sees eye doctor. Tinnitus.   The following were reviewed and entered/updated in epic: Past Medical History:  Diagnosis Date  . Allergy   . ASCUS on Pap smear 2007  . ASTHMA UNSPECIFIED WITH EXACERBATION 11/08/2008  . Cancer of skin of left ear   . Chest pain 03/2011   negative stress test - Dr. Ron Parker  . History of frequent urinary tract infections   . History of kidney stones   . [place], [place] 02/09/2009  . Kidney stones   . OSTEOARTHRITIS 04/24/2007  . Osteopenia   . Osteoporosis   . Pneumonia   . PONV (postoperative nausea and vomiting)   . SHINGLES 11/08/2008   had vaccine at drugstore 12/14  . VARICOSE VEINS LOWER EXTREMITIES W/INFLAMMATION 02/05/2008   Patient Active Problem List   Diagnosis Date Noted  . Paroxysmal atrial fibrillation ([place]) 12/11/2017    Priority: High  . Atypical chest pain 12/27/2014    Priority: Medium  . Hyperlipidemia 02/09/2009    Priority: Medium  . COPD (chronic obstructive pulmonary disease) ([place]) 02/05/2008    Priority: Medium  . S/P right TKA 08/07/2016    Priority: Low  . Dysphagia 11/14/2015    Priority: Low  . Obesity, unspecified 06/17/2014    Priority: Low  . Kidney stones 06/17/2014    Priority: Low  . Osteopenia     Priority: Low  . Allergic rhinitis 05/07/2008    Priority: Low  . VARICOSE VEINS LOWER EXTREMITIES W/INFLAMMATION 02/05/2008    Priority: Low  . Osteoarthritis 04/24/2007    Priority: Low   Past Surgical History:  Procedure Laterality Date  . Bone spur shoulder     x2 removed  . COLPOSCOPY     Neg HR HPV  . KNEE ARTHROSCOPY    . laser  surgery per left ey     secondary to film covering   . lens implants- both eyes    . SPINE SURGERY  2008   lumbar  . TOTAL KNEE ARTHROPLASTY Right 08/07/2016   Procedure: RIGHT TOTAL KNEE ARTHROPLASTY;  Surgeon: Paralee Cancel, MD;  Location: [facility];  Service: Orthopedics;  Laterality: Right;    Family History  Problem Relation Age of Onset  . Ovarian cancer Mother   . Melanoma Mother   . Hypertension Father   . Heart disease Father        MI 41  . Heart disease Paternal Grandfather   . Cancer Maternal Grandmother        Oral cancer  . Down syndrome Sister   . Prostate cancer Maternal Uncle   . Heart disease Maternal Grandfather   . Prostate cancer Maternal Uncle   . Prostate cancer Maternal Uncle   . Breast cancer Cousin        two maternal cousins diagnosed <50  . Diabetes Paternal Aunt   . Sudden Cardiac Death Son        unclear cause.   . Colon cancer Neg Hx   . Esophageal cancer Neg Hx   . Rectal cancer Neg Hx   . Stomach cancer Neg Hx     Medications- reviewed and updated Current Outpatient Medications  Medication Sig Dispense Refill  .  apixaban (ELIQUIS) 5 MG TABS tablet Take 1 tablet (5 mg total) by mouth 2 (two) times daily. 180 tablet 1  . Calcium Citrate 250 MG TABS Take 1,000 mg by mouth 2 (two) times daily.    . cetirizine (ZYRTEC ALLERGY) 10 MG tablet Take 10 mg by mouth at bedtime.      . Cholecalciferol (VITAMIN D3) 2000 UNITS TABS Take 2,000 Units by mouth 2 (two) times daily.      . fish oil-omega-3 fatty acids 1000 MG capsule Take 2 capsules (2 g total) by mouth 2 (two) times daily. total for 4,000 mg (Patient taking differently: Take 1 g by mouth daily. )    . FLOVENT HFA 110 MCG/ACT inhaler Inhale 2 puffs by mouth twice daily 12 g 0  . GLUCOSAMINE SULFATE PO Take 2 tablets by mouth 2 (two) times daily.     . metoprolol tartrate (LOPRESSOR) 25 MG tablet Take 1 tablet (25 mg total) by mouth 2 (two) times daily. 180 tablet 2  . Multiple Vitamin  (MULTIVITAMIN) tablet Take 1 tablet by mouth daily.    Marland Kitchen PROAIR HFA 108 (90 Base) MCG/ACT inhaler INHALE TWO PUFFS INTO LUNGS EVERY 6 HOURS AS NEEDED FOR WHEEZING FOR SHORTNESS OF BREATH 9 each 5  . rosuvastatin (CRESTOR) 10 MG tablet Take 1 tablet (10 mg total) by mouth daily. 90 tablet 2   No current facility-administered medications for this visit.    Allergies-reviewed and updated Allergies  Allergen Reactions  . Atorvastatin     Myalgia  . Cefuroxime Axetil Diarrhea and Nausea And Vomiting  . Other     Social History   Social History Narrative   Married 44 years in 2015. 2 kids. 2 grandkids-14 and 12 in 2020.       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 Oropharynx normal other than mild amount of drainage. Nares mildly erythematous Neck: no obvious thyromegaly CV: RRR no murmurs rubs or gallops Lungs: CTAB no crackles, wheeze, rhonchi Abdomen: soft/nontender/nondistended/normal bowel sounds. No rebound or guarding.  Ext: no edema Skin: warm, dry Neuro: grossly normal, moves all extremities, PERRLA   Assessment and Plan   74 y.o. female presenting for annual physical.  Health Maintenance counseling: 1. Anticipatory guidance: Patient counseled regarding regular dental exams yes q6 months- sees next week, eye exams yes- did find out macular degeneration- is going to see Dr. Zigmund Daniel as well,  avoiding smoking and second hand smoke yes, limiting alcohol to 1 beverage per 6 weeks .   2. Risk factor reduction:  Advised patient of need for regular exercise and diet rich and fruits and vegetables to  reduce risk of heart attack and stroke. Exercise- swim in past with deep water aerobics- wants to see if she can get back into the [facility]. Diet-cooks most of the time might eat out once a week- she thinks she can reduce sugar and salt intake to help with weight gain.  Morbid obesity with BMI over 40 Wt Readings from Last  3 Encounters:  12/22/19 257 lb (116.6 kg)  04/24/19 250 lb 12 oz (113.7 kg)  12/30/18 251 lb (113.9 kg)  3. Immunizations/screenings/ancillary studies-fully up-to-date on vaccinations other than Shingrix-patient wants to defer for now.   Immunization History  Administered Date(s) Administered  . Influenza Whole 08/15/2000, 08/06/2008  . Influenza, High Dose Seasonal PF 10/25/2014, 07/16/2016, 07/16/2017, 08/13/2018  . Influenza,inj,Quad PF,6+ Mos 08/01/2015, 07/30/2019  . PFIZER SARS-COV-2 Vaccination 11/19/2019, 12/10/2019  . PPD Test 07/05/2014  . Pneumococcal Conjugate-13 10/31/2015  . Pneumococcal Polysaccharide-23 08/15/2000, 06/08/2013  . Td 09/14/1998, 02/05/2008  . Tdap 06/16/2019  . Zoster 11/06/2013   4. Cervical cancer screening-  still sees gynecology per her preference- mom died of ovarian cancer 5. Breast cancer screening-  breast exam yes with gynecologyand mammogram yes  ( 12/21/19) 6. Colon cancer screening - 12/01/15 previously- she was told this would be her last colonoscopy 7. Skin cancer screening- saw dermatology recently. advised regular sunscreen use. Denies worrisome, changing, or new skin lesions.  8. Birth control/STD check- Postmenopausal/monogomous 9. Osteoporosis screening at 15- mild osteopenia in 2012- she agrees to repeat. I advised weight bearing exercise- she is considering ymca. She takes calcium and vitamin D  -Never smoker  Status of chronic or acute concerns   Paroxysmal atrial fibrillation (HCC)-compliant with Eliquis for anticoagulation.  Compliant with metoprolol 25 mg twice a day for rate control.  Appears stable-continue current  medication. She has been asymptomatic for most part unless had prolonged episodes- when had monitor on in past did not detect most of the episodes she had- she states about 2 years  Hyperlipidemia, unspecified hyperlipidemia type-patient compliant with rosuvastatin 10 mg daily.  Update lipid panel today.  Hopefully controlled-continue current medication.  She also takes fish oil  Centrilobular emphysema (HCC)-patient uses albuterol- not using much at all.  Uses Flovent on a more regular basis 2 puffs twice a day and this helps control her symptoms   Also takes zyrtec for allergies at nighttime helpful but having more issues over the last year. From avs "For allergies- lets try stopping zyrtec and trying xyzal or claritin or allegra- these are all reasonable substitutes- if not improving, I could send in flonase nasal spray"  nonpulsatile tinnitus- reports somewhat constant. Denies hearing loss. Offered referral to ent- she declines for now- she may call to schedule on her own   She would like to screen for diabetes- will check a1c. Doe shave obesity as well so this is reasonable  Recommended follow up: 1 year physical Future Appointments  Date Time Provider [place]  12/22/2019 10:00 AM Denman George B, LPN [facility]   Lab/Order associations: fasting   ICD-10-CM   1. Preventative health care  Z00.00   2. Paroxysmal atrial fibrillation (HCC)  I48.0   3. Hyperlipidemia, unspecified hyperlipidemia type  E78.5   4. Centrilobular emphysema ([place])  J43.2     No orders of the defined types were placed in this encounter.   Return precautions advised.  Garret Reddish, MD

## 2019-12-22 NOTE — Patient Instructions (Addendum)
Health Maintenance Due  Topic Date Due  . INFLUENZA VACCINE - [id number] 05/16/2019   Schedule your bone density test at check out desk. You may also call directly to X-ray at [phone] to schedule an appointment that is convenient for you.  - located [address] across the street from [place] - in the basement - you do need an appointment for the bone density tests.   Please check with your pharmacy to see if they have the shingrix vaccine. If they do- please get this immunization and update [place] by phone call or mychart with dates you receive the vaccine  For allergies- lets try stopping zyrtec and trying xyzal or claritin or allegra- these are all reasonable substitutes- if not improving, I could send in flonase nasal spray  See courtney after our visit  Please stop by lab before you go If you do not have mychart- we will call you about results within 5 business days of [place] receiving them.  If you have mychart- we will send your results within 3 business days of [place] receiving them.  If abnormal or we want to clarify a result, we will call or mychart you to make sure you receive the message.  If you have questions or concerns or don't hear within 5 business days, please send [place] a message or call us.

## 2019-12-29 DIAGNOSIS — H9313 Tinnitus, bilateral: Secondary | ICD-10-CM | POA: Diagnosis not present

## 2019-12-29 DIAGNOSIS — R42 Dizziness and giddiness: Secondary | ICD-10-CM | POA: Diagnosis not present

## 2019-12-29 DIAGNOSIS — H903 Sensorineural hearing loss, bilateral: Secondary | ICD-10-CM | POA: Diagnosis not present

## 2020-01-04 ENCOUNTER — Other Ambulatory Visit: Payer: Self-pay

## 2020-01-04 ENCOUNTER — Encounter (INDEPENDENT_AMBULATORY_CARE_PROVIDER_SITE_OTHER): Payer: Medicare PPO | Admitting: Ophthalmology

## 2020-01-04 DIAGNOSIS — H353121 Nonexudative age-related macular degeneration, left eye, early dry stage: Secondary | ICD-10-CM | POA: Diagnosis not present

## 2020-01-04 DIAGNOSIS — H353112 Nonexudative age-related macular degeneration, right eye, intermediate dry stage: Secondary | ICD-10-CM | POA: Diagnosis not present

## 2020-01-04 DIAGNOSIS — H43813 Vitreous degeneration, bilateral: Secondary | ICD-10-CM

## 2020-01-05 DIAGNOSIS — R42 Dizziness and giddiness: Secondary | ICD-10-CM | POA: Diagnosis not present

## 2020-01-05 DIAGNOSIS — H9313 Tinnitus, bilateral: Secondary | ICD-10-CM | POA: Diagnosis not present

## 2020-01-06 ENCOUNTER — Other Ambulatory Visit: Payer: Self-pay | Admitting: Family Medicine

## 2020-01-27 ENCOUNTER — Other Ambulatory Visit: Payer: Self-pay | Admitting: Cardiovascular Disease

## 2020-01-28 NOTE — Telephone Encounter (Signed)
Refill Request.  

## 2020-04-04 ENCOUNTER — Other Ambulatory Visit: Payer: Self-pay | Admitting: Cardiovascular Disease

## 2020-04-11 ENCOUNTER — Telehealth: Payer: Self-pay | Admitting: Cardiovascular Disease

## 2020-04-11 NOTE — Telephone Encounter (Signed)
From looking at PCP note looks like the suggestion was Flonase for allergic rhinitis. Flonase (or the over the counter version - fluticasone propionate) would be fine. It does take a few weeks to work so she will likely not notice an immediate change.   Loel Dubonnet, NP

## 2020-04-11 NOTE — Telephone Encounter (Signed)
Patient calling  Patient will be flying tomorrow, PCP told her to use a nasal spray Would like to know if we recommend a certain kind since she is on heart medication Please call to discuss

## 2020-04-12 NOTE — Telephone Encounter (Signed)
I attempted to call the patient. No answer- I left a detailed message on her voice mail (ok per DPR) of Laurann Montana, NP's recommendations.  I also advised that she may use a regular nasal saline spray for the purposes of flying.   I asked that she call back with any further questions/ concerns.

## 2020-05-04 ENCOUNTER — Telehealth: Payer: Self-pay | Admitting: Cardiovascular Disease

## 2020-05-04 MED ORDER — APIXABAN 5 MG PO TABS: 5.0000 mg | ORAL_TABLET | Freq: Two times a day (BID) | ORAL | 0 refills | Status: DC

## 2020-05-04 NOTE — Telephone Encounter (Signed)
*  STAT* If patient is at the pharmacy, call can be transferred to refill team.   1. Which medications need to be refilled? (please list name of each medication and dose if known) Eliquis 5mg  bid  2. Which pharmacy/location (including street and city if local pharmacy) is medication to be sent to? [facility] on [address] in [address]  3. Do they need a 30 day or 90 day supply? 90  Patient ran out this morning

## 2020-05-04 NOTE — Telephone Encounter (Signed)
Pt's last ov w/ Dr. Fletcher Anon was 04/24/19 and she does not have an appt scheduled.   Will send in 30 day supply and send to scheduling to get her an appt.

## 2020-05-04 NOTE — Telephone Encounter (Signed)
Scheduled in September

## 2020-05-04 NOTE — Telephone Encounter (Signed)
Please review for refill, Thanks !  

## 2020-05-15 ENCOUNTER — Other Ambulatory Visit: Payer: Self-pay | Admitting: Cardiovascular Disease

## 2020-05-27 ENCOUNTER — Other Ambulatory Visit: Payer: Self-pay

## 2020-05-27 MED ORDER — APIXABAN 5 MG PO TABS: 5.0000 mg | ORAL_TABLET | Freq: Two times a day (BID) | ORAL | 0 refills | Status: DC

## 2020-05-27 NOTE — Telephone Encounter (Signed)
*  STAT* If patient is at the pharmacy, call can be transferred to refill team.   1. Which medications need to be refilled? (please list name of each medication and dose if known) Eliquis  2. Which pharmacy/location (including street and city if local pharmacy) is medication to be sent to? [place]  3. Do they need a 30 day or 90 day supply? [place]

## 2020-05-27 NOTE — Telephone Encounter (Signed)
Refill request

## 2020-05-27 NOTE — Telephone Encounter (Signed)
Prescription refill request for Eliquis received.  Last office visit: Fletcher Anon, 04/24/2019 Scr: 0.72, 12/22/2019 Age: 74 y.o. Weight: 116.6 kg   Pt is scheduled to see Dr. Fletcher Anon 9/16  Prescription refill sent.

## 2020-05-28 ENCOUNTER — Other Ambulatory Visit: Payer: Self-pay | Admitting: Family Medicine

## 2020-06-30 ENCOUNTER — Ambulatory Visit: Payer: Medicare PPO | Admitting: Cardiovascular Disease

## 2020-07-01 DIAGNOSIS — Z20828 Contact with and (suspected) exposure to other viral communicable diseases: Secondary | ICD-10-CM | POA: Diagnosis not present

## 2020-07-04 ENCOUNTER — Other Ambulatory Visit: Payer: Self-pay | Admitting: Family

## 2020-07-04 MED ORDER — APIXABAN 5 MG PO TABS: 5.0000 mg | ORAL_TABLET | Freq: Two times a day (BID) | ORAL | 0 refills | Status: DC

## 2020-07-04 NOTE — Telephone Encounter (Signed)
Re-routed to appropriate pool via discussion with Ms. Donnal Debar

## 2020-07-04 NOTE — Telephone Encounter (Signed)
*  STAT* If patient is at the pharmacy, call can be transferred to refill team.   1. Which medications need to be refilled? (please list name of each medication and dose if known) Eliquis  2. Which pharmacy/location (including street and city if local pharmacy) is medication to be sent to? [facility] [address] in [place]  3. Do they need a 30 day or 90 day supply? [place]

## 2020-07-04 NOTE — Telephone Encounter (Signed)
Pt still overdue to see Dr. Fletcher Anon, but has appt 07/22/20. 30 day supply sent in.

## 2020-07-22 ENCOUNTER — Other Ambulatory Visit: Payer: Self-pay

## 2020-07-22 ENCOUNTER — Encounter: Payer: Self-pay | Admitting: Family

## 2020-07-22 ENCOUNTER — Ambulatory Visit: Payer: Medicare PPO | Admitting: Family

## 2020-07-22 VITALS — BP 126/80 | HR 64 | Ht 64.0 in | Wt 255.0 lb

## 2020-07-22 DIAGNOSIS — R7303 Prediabetes: Secondary | ICD-10-CM | POA: Diagnosis not present

## 2020-07-22 DIAGNOSIS — E782 Mixed hyperlipidemia: Secondary | ICD-10-CM | POA: Diagnosis not present

## 2020-07-22 DIAGNOSIS — Z7901 Long term (current) use of anticoagulants: Secondary | ICD-10-CM | POA: Diagnosis not present

## 2020-07-22 DIAGNOSIS — I480 Paroxysmal atrial fibrillation: Secondary | ICD-10-CM

## 2020-07-22 MED ORDER — APIXABAN 5 MG PO TABS: 5.0000 mg | ORAL_TABLET | Freq: Two times a day (BID) | ORAL | 11 refills | Status: DC

## 2020-07-22 MED ORDER — METOPROLOL TARTRATE 25 MG PO TABS: 25.0000 mg | ORAL_TABLET | Freq: Two times a day (BID) | ORAL | 3 refills | Status: DC

## 2020-07-22 MED ORDER — ROSUVASTATIN CALCIUM 10 MG PO TABS: 10.0000 mg | ORAL_TABLET | Freq: Every day | ORAL | 3 refills | Status: DC

## 2020-07-22 NOTE — Patient Instructions (Addendum)
Medication Instructions:  No medication changes today. We have sent to refills to your pharmacy.   *If you need a refill on your cardiac medications before your next appointment, please call your pharmacy*  Lab Work: No lab work today.   Testing/Procedures: Your EKG today showed normal sinus rhythm. This is a great result!  Follow-Up: At [facility], you and your health needs are our priority.  As part of our continuing mission to provide you with exceptional heart care, we have created designated Provider Care Teams.  These Care Teams include your primary Cardiologist (physician) and Advanced Practice Providers (APPs -  Physician Assistants and Nurse Practitioners) who all work together to provide you with the care you need, when you need it.  We recommend signing up for the patient portal called "MyChart".  Sign up information is provided on this After Visit Summary.  MyChart is used to connect with patients for Virtual Visits (Telemedicine).  Patients are able to view lab/test results, encounter notes, upcoming appointments, etc.  Non-urgent messages can be sent to your provider as well.   To learn more about what you can do with MyChart, go to [email or web address].    Your next appointment:   1 year(s)  The format for your next appointment:   In Person  Provider:   You may see Kathlyn Sacramento, MD or one of the following Advanced Practice Providers on your designated Care Team:   Murray Hodgkins, NP  Christell Faith, PA-C  Marrianne Mood, PA-C  Cadence Kathlen Mody, [place]  Laurann [place], NP  Other Instructions  Recommend following up with your primary care provider regarding you arm numbness. Try doing some gentle stretching exercises.

## 2020-07-22 NOTE — Progress Notes (Signed)
Office Visit    Patient Name: [name] Date of Encounter: 07/22/2020  Primary Care Provider:  Marin Olp, MD Primary Cardiologist:  Kathlyn Sacramento, MD Electrophysiologist:  None   Chief Complaint    [name] is a 74 y.o. female with a hx of PAF, COPD, HLD presents today for follow up of PAF.   Past Medical History    Past Medical History:  Diagnosis Date  . Allergy   . ASCUS on Pap smear 2007  . ASTHMA UNSPECIFIED WITH EXACERBATION 11/08/2008  . Cancer of skin of left ear   . Chest pain 03/2011   negative stress test - Dr. Ron Parker  . History of frequent urinary tract infections   . History of kidney stones   . [place] 02/09/2009  . Kidney stones   . OSTEOARTHRITIS 04/24/2007  . Osteopenia   . Osteoporosis   . Pneumonia   . PONV (postoperative nausea and vomiting)   . SHINGLES 11/08/2008   had vaccine at drugstore 12/14  . VARICOSE VEINS LOWER EXTREMITIES W/INFLAMMATION 02/05/2008   Past Surgical History:  Procedure Laterality Date  . Bone spur shoulder     x2 removed  . COLPOSCOPY     Neg HR HPV  . KNEE ARTHROSCOPY    . laser surgery per left ey     secondary to film covering   . lens implants- both eyes    . SPINE SURGERY  2008   lumbar  . TOTAL KNEE ARTHROPLASTY Right 08/07/2016   Procedure: RIGHT TOTAL KNEE ARTHROPLASTY;  Surgeon: Paralee Cancel, MD;  Location: [facility];  Service: Orthopedics;  Laterality: Right;    Allergies  Allergies  Allergen Reactions  . Atorvastatin     Myalgia  . Cefuroxime Axetil Diarrhea and Nausea And Vomiting  . Other     History of Present Illness    [name] is a 74 y.o. female with a hx of PAF, COPD, HLD last seen 04/24/2019 by Dr. Fletcher Anon.  Last nuclear stress test in 2016 for atypical chest and back pain that was without evidence of ischemia. When last seen 04/2019 she was doing well from cardiac perspective though frustrated at her gradual continued weight gain. Lifestyle changes were  discussed.  Reports today for follow up. Doing well from cardiac perspective. Still with noted weight gain, plans to return to doing water aerobics. Most recent A1c with PCP in pre-diabetic range. We discussed dietary changes.   Tells me she will notice her arms "sleeping" or becoming numb when she is sitting and watching a movie. Occurs in bilateral arms. We discussed possible etiology of pinched nerve or muscle pain. Encouraged to discuss with her primary care provider. She notes some swelling in her fingers. She has been working to reduce her sodium intake and notes this helps with her fingers swelling. No LE edema, orthopnea, PND.   Reports no shortness of breath nor dyspnea on exertion. Reports no chest pain, pressure, or tightness. PND. Reports no palpitations.   EKGs/Labs/Other Studies Reviewed:   The following studies were reviewed today:  EKG:  EKG is ordered today.  The ekg ordered today demonstrates NSR 64 bpm with no acute ST/T wave changes. Low voltage QRS likely due to body habitus.   Recent Labs: 12/22/2019: ALT 20; BUN 21; Creatinine, Ser 0.72; Hemoglobin 13.8; Platelets 172.0; Potassium 4.6; Sodium 140  Recent Lipid Panel    Component Value Date/Time   CHOL 143 12/22/2019 0949   CHOL 221 (H)  06/17/2015 0856   TRIG 112.0 12/22/2019 0949   HDL 52.80 12/22/2019 0949   HDL 67 06/17/2015 0856   CHOLHDL 3 12/22/2019 0949   VLDL 22.4 12/22/2019 0949   LDLCALC 67 12/22/2019 0949   LDLCALC 142 (H) 06/17/2015 0856   LDLDIRECT 149.5 10/24/2012 0831    Home Medications   Current Meds  Medication Sig  . apixaban (ELIQUIS) 5 MG TABS tablet Take 1 tablet (5 mg total) by mouth 2 (two) times daily.  . Calcium Citrate 250 MG TABS Take 1,000 mg by mouth 2 (two) times daily.  . cetirizine (ZYRTEC ALLERGY) 10 MG tablet Take 10 mg by mouth at bedtime.    . Cholecalciferol (VITAMIN D3) 2000 UNITS TABS Take 2,000 Units by mouth 2 (two) times daily.    . fish oil-omega-3 fatty acids 1000  MG capsule Take 2 capsules (2 g total) by mouth 2 (two) times daily. total for 4,000 mg (Patient taking differently: Take 1 g by mouth daily. )  . FLOVENT HFA 110 MCG/ACT inhaler Inhale 2 puffs by mouth twice daily  . GLUCOSAMINE SULFATE PO Take 2 tablets by mouth 2 (two) times daily.   . metoprolol tartrate (LOPRESSOR) 25 MG tablet Take 1 tablet by mouth twice daily  . Multiple Vitamin (MULTIVITAMIN) tablet Take 1 tablet by mouth daily.  Marland Kitchen PROAIR HFA 108 (90 Base) MCG/ACT inhaler INHALE TWO PUFFS INTO LUNGS EVERY 6 HOURS AS NEEDED FOR WHEEZING FOR SHORTNESS OF BREATH  . rosuvastatin (CRESTOR) 10 MG tablet Take 1 tablet by mouth once daily     Review of Systems    All other systems reviewed and are otherwise negative except as noted above.  Physical Exam    VS:  BP 126/80 (BP Location: Left Arm, Patient Position: Sitting, Cuff Size: Normal)   Pulse 64   Ht 5\' 4"  (1.626 m)   Wt 255 lb (115.7 kg)   SpO2 97%   BMI 43.77 kg/m  , BMI Body mass index is 43.77 kg/m. GEN: Well nourished, overweight,  well developed, in no acute distress. HEENT: normal. Neck: Supple, no JVD, carotid bruits, or masses. Cardiac: RRR, no murmurs, rubs, or gallops. No clubbing, cyanosis, edema.  Radials/DP/PT 2+ and equal bilaterally.  Respiratory:  Respirations regular and unlabored, clear to auscultation bilaterally. GI: Soft, nontender, nondistended, BS + x 4. MS: No deformity or atrophy. Skin: Warm and dry, no rash. Neuro:  Strength and sensation are intact. Psych: Normal affect.  Assessment & Plan    1. PAF- Denies palpitations. EKG today shows she is maintaining NSR.  Continue Metoprolol Tartrate 25mg  BID. Continue Eliquis 5mg  BID. Refills provided.  2. Chronic anticoagulation - Secondary to APF. Denies bleeding complications. 12/22/19 Hb 13.8. Continue Eliquis 5mg  BID. Does not meet criteria for dose reductions. Refill provided.   3. HLD- Lipid panel 12/22/19 LDL 67. Continue Crestor 10mg  daily. Refill  provided.   4. Obesity- Weight loss via diet and exercise encouraged. We discussed finding a pool to use for exercise.  5. Prediabetes - Most recent A1c 6.8. Lifestyle changes encouraged including regular exercise and reduced carbohydrate intake. Discussed using the 'Plate Method'.  Disposition: Follow up in 1 year(s) with Dr. Fletcher Anon or APP  Loel Dubonnet, NP 07/22/2020, 2:30 PM

## 2020-09-20 ENCOUNTER — Other Ambulatory Visit: Payer: Self-pay | Admitting: Family Medicine

## 2020-11-02 ENCOUNTER — Other Ambulatory Visit: Payer: Self-pay | Admitting: Family Medicine

## 2020-11-16 ENCOUNTER — Other Ambulatory Visit: Payer: Self-pay | Admitting: Family Medicine

## 2020-11-16 ENCOUNTER — Telehealth: Payer: Self-pay

## 2020-11-16 DIAGNOSIS — M8588 Other specified disorders of bone density and structure, other site: Secondary | ICD-10-CM

## 2020-11-16 NOTE — Telephone Encounter (Signed)
Patient is calling in wondering where she should go for a bone density scan, as she is due for one.

## 2020-11-16 NOTE — Telephone Encounter (Signed)
Called and lm for pt tcb. Please inform pt that orders have been placed for pt to have this done at the [facility].

## 2020-11-17 NOTE — Telephone Encounter (Signed)
Patient given message below.

## 2020-12-08 ENCOUNTER — Other Ambulatory Visit: Payer: Self-pay | Admitting: Family Medicine

## 2020-12-15 DIAGNOSIS — D225 Melanocytic nevi of trunk: Secondary | ICD-10-CM | POA: Diagnosis not present

## 2020-12-15 DIAGNOSIS — Z86018 Personal history of other benign neoplasm: Secondary | ICD-10-CM | POA: Diagnosis not present

## 2020-12-15 DIAGNOSIS — L570 Actinic keratosis: Secondary | ICD-10-CM | POA: Diagnosis not present

## 2020-12-15 DIAGNOSIS — L578 Other skin changes due to chronic exposure to nonionizing radiation: Secondary | ICD-10-CM | POA: Diagnosis not present

## 2020-12-15 DIAGNOSIS — L821 Other seborrheic keratosis: Secondary | ICD-10-CM | POA: Diagnosis not present

## 2020-12-15 DIAGNOSIS — L814 Other melanin hyperpigmentation: Secondary | ICD-10-CM | POA: Diagnosis not present

## 2020-12-15 DIAGNOSIS — Z85828 Personal history of other malignant neoplasm of skin: Secondary | ICD-10-CM | POA: Diagnosis not present

## 2020-12-22 NOTE — Progress Notes (Signed)
Phone [phone]   Subjective:  Patient presents today for their annual physical. Chief complaint-noted.   See problem oriented charting- ROS- full  review of systems was completed and negative except for: fatigue, ear pain, tinnitus, seasonal allergies and post nasal drip, ankle swelling stable, some cough with allergies, nausea at times, back pain if stands prolonged periods (discussed weight loss)   The following were reviewed and entered/updated in epic: Past Medical History:  Diagnosis Date  . Allergy   . ASCUS on Pap smear 2007  . ASTHMA UNSPECIFIED WITH EXACERBATION 11/08/2008  . Cancer of skin of left ear   . Chest pain 03/2011   negative stress test - Dr. Ron Parker  . History of frequent urinary tract infections   . History of kidney stones   . [place], [place] 02/09/2009  . Kidney stones   . OSTEOARTHRITIS 04/24/2007  . Osteopenia   . Osteoporosis   . Pneumonia   . PONV (postoperative nausea and vomiting)   . SHINGLES 11/08/2008   had vaccine at drugstore 12/14  . VARICOSE VEINS LOWER EXTREMITIES W/INFLAMMATION 02/05/2008   Patient Active Problem List   Diagnosis Date Noted  . Paroxysmal atrial fibrillation ([place]) 12/11/2017    Priority: High  . Macular degeneration 12/22/2019    Priority: Medium  . Atypical chest pain 12/27/2014    Priority: Medium  . Hyperlipidemia 02/09/2009    Priority: Medium  . COPD (chronic obstructive pulmonary disease) ([place]) 02/05/2008    Priority: Medium  . S/P right TKA 08/07/2016    Priority: Low  . Dysphagia 11/14/2015    Priority: Low  . Class 3 severe obesity due to excess calories without serious comorbidity with body mass index (BMI) of 40.0 to 44.9 in adult [facility]) 06/17/2014    Priority: Low  . Kidney stones 06/17/2014    Priority: Low  . Osteopenia     Priority: Low  . Allergic rhinitis 05/07/2008    Priority: Low  . VARICOSE VEINS LOWER EXTREMITIES W/INFLAMMATION 02/05/2008    Priority: Low  . Osteoarthritis  04/24/2007    Priority: Low   Past Surgical History:  Procedure Laterality Date  . Bone spur shoulder     x2 removed  . COLPOSCOPY     Neg HR HPV  . KNEE ARTHROSCOPY    . laser surgery per left ey     secondary to film covering   . lens implants- both eyes    . SPINE SURGERY  2008   lumbar  . TOTAL KNEE ARTHROPLASTY Right 08/07/2016   Procedure: RIGHT TOTAL KNEE ARTHROPLASTY;  Surgeon: Paralee Cancel, MD;  Location: [facility];  Service: Orthopedics;  Laterality: Right;    Family History  Problem Relation Age of Onset  . Ovarian cancer Mother   . Melanoma Mother   . Dementia Mother   . Hypertension Father   . Heart disease Father        MI 65  . Heart disease Paternal Grandfather   . Cancer Maternal Grandmother        Oral cancer  . Down syndrome Sister   . Prostate cancer Maternal Uncle   . Heart disease Maternal Grandfather   . Prostate cancer Maternal Uncle   . Prostate cancer Maternal Uncle   . Breast cancer Cousin        two maternal cousins diagnosed <50  . Diabetes Paternal Aunt   . Sudden Cardiac Death Son        unclear cause.   . Colon cancer Neg  Hx   . Esophageal cancer Neg Hx   . Rectal cancer Neg Hx   . Stomach cancer Neg Hx     Medications- reviewed and updated Current Outpatient Medications  Medication Sig Dispense Refill  . apixaban (ELIQUIS) 5 MG TABS tablet Take 1 tablet (5 mg total) by mouth 2 (two) times daily. 60 tablet 11  . Calcium Citrate 250 MG TABS Take 1,000 mg by mouth 2 (two) times daily.    . cetirizine (ZYRTEC) 10 MG tablet Take 10 mg by mouth at bedtime.    . Cholecalciferol (VITAMIN D3) 2000 UNITS TABS Take 2,000 Units by mouth 2 (two) times daily.    . fish oil-omega-3 fatty acids 1000 MG capsule Take 2 capsules (2 g total) by mouth 2 (two) times daily. total for 4,000 mg (Patient taking differently: Take 1 g by mouth daily.)    . FLOVENT HFA 110 MCG/ACT inhaler Inhale 2 puffs by mouth twice daily 12 g 0  . GLUCOSAMINE SULFATE PO Take  2 tablets by mouth 2 (two) times daily.     . metoprolol tartrate (LOPRESSOR) 25 MG tablet Take 1 tablet (25 mg total) by mouth 2 (two) times daily. 180 tablet 3  . Multiple Vitamin (MULTIVITAMIN) tablet Take 1 tablet by mouth daily.    Marland Kitchen PROAIR HFA 108 (90 Base) MCG/ACT inhaler INHALE TWO PUFFS INTO LUNGS EVERY 6 HOURS AS NEEDED FOR WHEEZING FOR SHORTNESS OF BREATH 9 each 5  . rosuvastatin (CRESTOR) 10 MG tablet Take 1 tablet (10 mg total) by mouth daily. 90 tablet 3   No current facility-administered medications for this visit.    Allergies-reviewed and updated Allergies  Allergen Reactions  . Atorvastatin     Myalgia  . Cefuroxime Axetil Diarrhea and Nausea And Vomiting  . Other     Social History   Social History Narrative   Married 44 years in 2015. 2 kids. 2 grandkids-14 and 12 in 2020.       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       Hobbies: works with grandsons in [place], Haematologist (hates housework), goes on 1 month motorcyle biking trip each year   Objective  Objective:  BP 132/84   Pulse 72   Temp (!) 97.4 F (36.3 C) (Temporal)   Ht 5\' 4"  (1.626 m)   Wt 264 lb 9.6 oz (120 kg)   SpO2 95%   BMI 45.42 kg/m  Gen: NAD, resting comfortably HEENT: Mucous membranes are moist. Oropharynx normal Neck: no thyromegaly CV: RRR no murmurs rubs or gallops Lungs: CTAB no crackles, wheeze, rhonchi Abdomen: soft/nontender/nondistended/normal bowel sounds. No rebound or guarding. obese Ext: no edema Skin: warm, dry Neuro: grossly normal, moves all extremities, PERRLA   Assessment and Plan   75 y.o. female presenting for annual physical.  Health Maintenance counseling: 1. Anticipatory guidance: Patient counseled regarding regular dental exams -q6 months, eye exams -close follow-up due to macular degeneration,  avoiding smoking and second hand smoke , limiting alcohol to 1 beverage per day- 1 per month .   2. Risk factor reduction:   Advised patient of need for regular exercise and diet rich and fruits and vegetables to reduce risk of heart attack and stroke. Exercise-  Misses the pool- can rent lanes but due to gas prices harder for her to get out for this. Diet-some stress eating with pandemic and weight up.  Morbid obesity noted with BMI over 40-discussed healthy  eating/regular exercise as above. Discussed cutting plate in half and coming back after 20 minutes.  Wt Readings from Last 3 Encounters:  12/23/20 264 lb 9.6 oz (120 kg)  07/22/20 255 lb (115.7 kg)  12/22/19 257 lb 0.9 oz (116.6 kg)  3. Immunizations/screenings/ancillary studies-discussed with receiving first Shingrix would simply get a second at her pharmacy-if she can let us know the dates of these we will update her chart Immunization History  Administered Date(s) Administered  . Fluad Quad(high Dose 65+) 08/18/2020  . Influenza Whole 08/15/2000, 08/06/2008  . Influenza, High Dose Seasonal PF 10/25/2014, 07/16/2016, 07/16/2017, 08/13/2018  . Influenza,inj,Quad PF,6+ Mos 08/01/2015, 07/30/2019  . PFIZER(Purple Top)SARS-COV-2 Vaccination 11/19/2019, 12/10/2019, 10/26/2020  . PPD Test 07/05/2014  . Pneumococcal Conjugate-13 10/31/2015  . Pneumococcal Polysaccharide-23 08/15/2000, 06/08/2013  . Td 09/14/1998, 02/05/2008  . Tdap 06/16/2019  . Zoster 11/06/2013  4. Cervical cancer screening- sees gynecology still- mom had ovarian cancer 5. Breast cancer screening-  breast exam with gynecology and mammogram - scheduled for monday 6. Colon cancer screening - 12/01/15- she was told this would be last colonoscopy 7. Skin cancer screening- sees dermatology at least yearly. advised regular sunscreen use. Denies worrisome, changing, or new skin lesions.  8. Birth control/STD check- postmenopausal/monogomous 9. Osteoporosis screening at 23- scheduled for monday -Never smoker  Status of chronic or acute concerns   # Atrial fibrillation- will see cardiology in the  summer she reports S: Rate controlled with metoprolol 25 mg twice daily Anticoagulated with Eliquis 5 mg twice daily A/P: Stable. Continue current medications.    # COPD /allergies S: Patient reports stable shortness of breath and cough. Does get sinus drainage most of the year and postnasal drip that causes cough. Staying on zyrtec. - suggested adding flonase Maintenance medications: Flovent 110 mcg 2 puffs twice daily  Patient has had to use albuterol 0 x per day. no oxygen at home  A/P: copd stable but having some allergy symptoms-  Try switching from zyrtec to xyzal for a month- if no improvement in drainage can add flonase over the counter  #hyperlipidemia S: Medication: Rosuvastatin 10Mg , fish oil 1000Mg  Lab Results  Component Value Date   CHOL 143 12/22/2019   HDL 52.80 12/22/2019   LDLCALC 67 12/22/2019   LDLDIRECT 149.5 10/24/2012   TRIG 112.0 12/22/2019   CHOLHDL 3 12/22/2019   A/P: hopefully stable- update lipid panel today. Continue current meds  # Hyperglycemia/insulin resistance/prediabetes S:  Medication: none Exercise and diet- see above Lab Results  Component Value Date   HGBA1C 5.8 12/22/2019   A/P: hopefully stable- update a1c todya    Recommended follow up: Return in about 1 year (around 12/23/2021) for physical or sooner if needed. Future Appointments  Date Time Provider [place]  01/03/2021  9:15 AM Hayden Pedro, MD [facility] None  01/04/2021 10:00 AM Tamela Gammon, NP [facility] None   Lab/Order associations: fasting   ICD-10-CM   1. Preventative health care  Z00.00   2. Hyperlipidemia, unspecified hyperlipidemia type  E78.5   3. Centrilobular emphysema (HCC) Chronic J43.2   4. Paroxysmal atrial fibrillation (HCC) Chronic I48.0   5. Morbid obesity ([place]) Chronic E66.01   6. Hyperglycemia  R73.9    Return precautions advised.  Garret Reddish, MD

## 2020-12-22 NOTE — Patient Instructions (Addendum)
Please stop by lab before you go If you have mychart- we will send your results within 3 business days of [place] receiving them.  If you do not have mychart- we will call you about results within 5 business days of [place] receiving them.  *please also note that you will see labs on mychart as soon as they post. I will later go in and write notes on them- will say "notes from Dr. Yong Channel"  Try switching from zyrtec to xyzal for a month- if no improvement in drainage can add flonase over the counter  Recommended follow up: Return in about 1 year (around 12/23/2021) for physical or sooner if needed.

## 2020-12-23 ENCOUNTER — Encounter: Payer: Self-pay | Admitting: Family Medicine

## 2020-12-23 ENCOUNTER — Other Ambulatory Visit: Payer: Self-pay

## 2020-12-23 ENCOUNTER — Ambulatory Visit (INDEPENDENT_AMBULATORY_CARE_PROVIDER_SITE_OTHER): Payer: Medicare PPO | Admitting: Family Medicine

## 2020-12-23 VITALS — BP 132/84 | HR 72 | Temp 97.4°F | Ht 64.0 in | Wt 264.6 lb

## 2020-12-23 DIAGNOSIS — I480 Paroxysmal atrial fibrillation: Secondary | ICD-10-CM | POA: Diagnosis not present

## 2020-12-23 DIAGNOSIS — J432 Centrilobular emphysema: Secondary | ICD-10-CM | POA: Diagnosis not present

## 2020-12-23 DIAGNOSIS — E785 Hyperlipidemia, unspecified: Secondary | ICD-10-CM | POA: Diagnosis not present

## 2020-12-23 DIAGNOSIS — Z0000 Encounter for general adult medical examination without abnormal findings: Secondary | ICD-10-CM

## 2020-12-23 DIAGNOSIS — R739 Hyperglycemia, unspecified: Secondary | ICD-10-CM

## 2020-12-23 LAB — CBC WITH DIFFERENTIAL/PLATELET
Basophils Absolute: 0.1 10*3/uL (ref 0.0–0.1)
Basophils Relative: 0.9 % (ref 0.0–3.0)
Eosinophils Absolute: 0 10*3/uL (ref 0.0–0.7)
Eosinophils Relative: 0.6 % (ref 0.0–5.0)
HCT: 41.1 % (ref 36.0–46.0)
Hemoglobin: 13.6 g/dL (ref 12.0–15.0)
Lymphocytes Relative: 14.1 % (ref 12.0–46.0)
Lymphs Abs: 0.8 10*3/uL (ref 0.7–4.0)
MCHC: 33 g/dL (ref 30.0–36.0)
MCV: 95.3 fl (ref 78.0–100.0)
Monocytes Absolute: 0.5 10*3/uL (ref 0.1–1.0)
Monocytes Relative: 8.7 % (ref 3.0–12.0)
Neutro Abs: 4.2 10*3/uL (ref 1.4–7.7)
Neutrophils Relative %: 75.7 % (ref 43.0–77.0)
Platelets: 159 10*3/uL (ref 150.0–400.0)
RBC: 4.32 Mil/uL (ref 3.87–5.11)
RDW: 13.8 % (ref 11.5–15.5)
WBC: 5.6 10*3/uL (ref 4.0–10.5)

## 2020-12-23 LAB — TSH: TSH: 2.77 u[IU]/mL (ref 0.35–4.50)

## 2020-12-23 LAB — COMPREHENSIVE METABOLIC PANEL
ALT: 25 U/L (ref 0–35)
AST: 20 U/L (ref 0–37)
Albumin: 4 g/dL (ref 3.5–5.2)
Alkaline Phosphatase: 102 U/L (ref 39–117)
BUN: 21 mg/dL (ref 6–23)
CO2: 29 mEq/L (ref 19–32)
Calcium: 9.4 mg/dL (ref 8.4–10.5)
Chloride: 105 mEq/L (ref 96–112)
Creatinine, Ser: 0.77 mg/dL (ref 0.40–1.20)
GFR: 76.19 mL/min (ref >60.00)
Glucose, Bld: 94 mg/dL (ref 70–99)
Potassium: 4.2 mEq/L (ref 3.5–5.1)
Sodium: 141 mEq/L (ref 135–145)
Total Bilirubin: 0.8 mg/dL (ref 0.2–1.2)
Total Protein: 6.8 g/dL (ref 6.0–8.3)

## 2020-12-23 LAB — LIPID PANEL
Cholesterol: 143 mg/dL (ref 0–200)
HDL: 56 mg/dL (ref >39.00)
LDL Cholesterol: 72 mg/dL (ref 0–99)
NonHDL: 87.07
Total CHOL/HDL Ratio: 3
Triglycerides: 75 mg/dL (ref 0.0–149.0)
VLDL: 15 mg/dL (ref 0.0–40.0)

## 2020-12-23 LAB — HEMOGLOBIN A1C: Hgb A1c MFr Bld: 5.9 % (ref 4.6–6.5)

## 2020-12-26 DIAGNOSIS — M8589 Other specified disorders of bone density and structure, multiple sites: Secondary | ICD-10-CM | POA: Diagnosis not present

## 2020-12-26 DIAGNOSIS — Z780 Asymptomatic menopausal state: Secondary | ICD-10-CM | POA: Diagnosis not present

## 2020-12-26 DIAGNOSIS — Z1231 Encounter for screening mammogram for malignant neoplasm of breast: Secondary | ICD-10-CM | POA: Diagnosis not present

## 2020-12-26 LAB — HM DEXA SCAN

## 2020-12-26 LAB — HM MAMMOGRAPHY

## 2020-12-27 ENCOUNTER — Encounter: Payer: Self-pay | Admitting: Family Medicine

## 2020-12-27 ENCOUNTER — Telehealth: Payer: Self-pay

## 2020-12-27 NOTE — Telephone Encounter (Signed)
Patient called back regarding lab results  

## 2020-12-27 NOTE — Telephone Encounter (Signed)
Spoke with her in regards to her lab results.

## 2021-01-02 ENCOUNTER — Encounter: Payer: Self-pay | Admitting: Family Medicine

## 2021-01-03 ENCOUNTER — Other Ambulatory Visit: Payer: Self-pay

## 2021-01-03 ENCOUNTER — Encounter (INDEPENDENT_AMBULATORY_CARE_PROVIDER_SITE_OTHER): Payer: Medicare PPO | Admitting: Ophthalmology

## 2021-01-03 DIAGNOSIS — H353121 Nonexudative age-related macular degeneration, left eye, early dry stage: Secondary | ICD-10-CM

## 2021-01-03 DIAGNOSIS — H43813 Vitreous degeneration, bilateral: Secondary | ICD-10-CM | POA: Diagnosis not present

## 2021-01-03 DIAGNOSIS — H353112 Nonexudative age-related macular degeneration, right eye, intermediate dry stage: Secondary | ICD-10-CM

## 2021-01-04 ENCOUNTER — Encounter: Payer: Self-pay | Admitting: Nurse Practitioner

## 2021-01-04 ENCOUNTER — Ambulatory Visit: Payer: Medicare PPO | Admitting: Nurse Practitioner

## 2021-01-04 VITALS — BP 118/78 | HR 72 | Ht 64.0 in | Wt 263.0 lb

## 2021-01-04 DIAGNOSIS — Z01419 Encounter for gynecological examination (general) (routine) without abnormal findings: Secondary | ICD-10-CM

## 2021-01-04 DIAGNOSIS — R140 Abdominal distension (gaseous): Secondary | ICD-10-CM

## 2021-01-04 DIAGNOSIS — M8580 Other specified disorders of bone density and structure, unspecified site: Secondary | ICD-10-CM

## 2021-01-04 DIAGNOSIS — Z8041 Family history of malignant neoplasm of ovary: Secondary | ICD-10-CM

## 2021-01-04 NOTE — Patient Instructions (Signed)
Health Maintenance After Age 74 After age 74, you are at a higher risk for certain long-term diseases and infections as well as injuries from falls. Falls are a major cause of broken bones and head injuries in people who are older than age 74. Getting regular preventive care can help to keep you healthy and well. Preventive care includes getting regular testing and making lifestyle changes as recommended by your health care provider. Talk with your health care provider about:  Which screenings and tests you should have. A screening is a test that checks for a disease when you have no symptoms.  A diet and exercise plan that is right for you. What should I know about screenings and tests to prevent falls? Screening and testing are the best ways to find a health problem early. Early diagnosis and treatment give you the best chance of managing medical conditions that are common after age 74. Certain conditions and lifestyle choices may make you more likely to have a fall. Your health care provider may recommend:  Regular vision checks. Poor vision and conditions such as cataracts can make you more likely to have a fall. If you wear glasses, make sure to get your prescription updated if your vision changes.  Medicine review. Work with your health care provider to regularly review all of the medicines you are taking, including over-the-counter medicines. Ask your health care provider about any side effects that may make you more likely to have a fall. Tell your health care provider if any medicines that you take make you feel dizzy or sleepy.  Osteoporosis screening. Osteoporosis is a condition that causes the bones to get weaker. This can make the bones weak and cause them to break more easily.  Blood pressure screening. Blood pressure changes and medicines to control blood pressure can make you feel dizzy.  Strength and balance checks. Your health care provider may recommend certain tests to check your  strength and balance while standing, walking, or changing positions.  Foot health exam. Foot pain and numbness, as well as not wearing proper footwear, can make you more likely to have a fall.  Depression screening. You may be more likely to have a fall if you have a fear of falling, feel emotionally low, or feel unable to do activities that you used to do.  Alcohol use screening. Using too much alcohol can affect your balance and may make you more likely to have a fall. What actions can I take to lower my risk of falls? General instructions  Talk with your health care provider about your risks for falling. Tell your health care provider if: ? You fall. Be sure to tell your health care provider about all falls, even ones that seem minor. ? You feel dizzy, sleepy, or off-balance.  Take over-the-counter and prescription medicines only as told by your health care provider. These include any supplements.  Eat a healthy diet and maintain a healthy weight. A healthy diet includes low-fat dairy products, low-fat (lean) meats, and fiber from whole grains, beans, and lots of fruits and vegetables. Home safety  Remove any tripping hazards, such as rugs, cords, and clutter.  Install safety equipment such as grab bars in bathrooms and safety rails on stairs.  Keep rooms and walkways well-lit. Activity  Follow a regular exercise program to stay fit. This will help you maintain your balance. Ask your health care provider what types of exercise are appropriate for you.  If you need a cane or walker,   use it as recommended by your health care provider.  Wear supportive shoes that have nonskid soles.   Lifestyle  Do not drink alcohol if your health care provider tells you not to drink.  If you drink alcohol, limit how much you have: ? 0-1 drink a day for women. ? 0-2 drinks a day for men.  Be aware of how much alcohol is in your drink. In the [place] one drink equals one typical bottle of beer (12  oz), one-half glass of wine (5 oz), or one shot of hard liquor (1 oz).  Do not use any products that contain nicotine or tobacco, such as cigarettes and e-cigarettes. If you need help quitting, ask your health care provider. Summary  Having a healthy lifestyle and getting preventive care can help to protect your health and wellness after age 74.  Screening and testing are the best way to find a health problem early and help you avoid having a fall. Early diagnosis and treatment give you the best chance for managing medical conditions that are more common for people who are older than age 74.  Falls are a major cause of broken bones and head injuries in people who are older than age 74. Take precautions to prevent a fall at home.  Work with your health care provider to learn what changes you can make to improve your health and wellness and to prevent falls. This information is not intended to replace advice given to you by your health care provider. Make sure you discuss any questions you have with your health care provider. Document Revised: 01/22/2019 Document Reviewed: 08/14/2017 Elsevier Patient Education  2021 Elsevier Inc.  

## 2021-01-04 NOTE — Progress Notes (Signed)
   QUINETTE HENTGES [birth year] [id number]   History:  75 y.o. G2P2002 presents for breast and pelvic exam. Postmenopausal - no HRT, no bleeding. Normal pap and mammogram history History of a.fib-on eliquis, COPD. Osteopenia managed by PCP. She complains of abdominal bloating and "twinges". Mother deceased from ovarian cancer.   Gynecologic History No LMP recorded. Patient is postmenopausal.   Contraception/Family planning: post menopausal status  Health Maintenance Last Pap: No longer screening per guidelines Last mammogram: 12/26/2020. Results were: no report yet Last colonoscopy: 2017 Last Dexa: 12/26/2020. Results were: no report yet  Past medical history, past surgical history, family history and social history were all reviewed and documented in the EPIC chart.  ROS:  A ROS was performed and pertinent positives and negatives are included.  Exam:  Vitals:   01/04/21 1005  BP: 118/78  Pulse: 72  Weight: 263 lb (119.3 kg)  Height: 5\' 4"  (1.626 m)   Body mass index is 45.14 kg/m.  General appearance:  Normal Thyroid:  Symmetrical, normal in size, without palpable masses or nodularity. Respiratory  Auscultation:  Clear without wheezing or rhonchi Cardiovascular  Auscultation:  Regular rate, without rubs, murmurs or gallops  Edema/varicosities:  Not grossly evident Abdominal  Soft,nontender, without masses, guarding or rebound.  Liver/spleen:  No organomegaly noted  Hernia:  None appreciated  Skin  Inspection:  Grossly normal   Breasts: Examined lying and sitting.   Right: Without masses, retractions, discharge or axillary adenopathy.   Left: Without masses, retractions, discharge or axillary adenopathy. Gentitourinary   Inguinal/mons:  Normal without inguinal adenopathy  External genitalia:  Normal  BUS/Urethra/Skene's glands:  Normal  Vagina:  Atrophic changes  Cervix:  Normal  Uterus:  Normal in size, shape and contour.  Midline and mobile  Adnexa/parametria:      Rt: Without masses or tenderness.   Lt: Without masses or tenderness.  Anus and perineum: Normal  Digital rectal exam: Normal sphincter tone without palpated masses or tenderness  Assessment/Plan:  75 y.o. [id number] for annual exam.   Well female exam with routine gynecological exam - Education provided on SBEs, importance of preventative screenings, current guidelines, high calcium diet, regular exercise, and multivitamin daily. Labs with PCP.   Abdominal bloating - Plan: US PELVIS TRANSVAGINAL NON-OB (TV ONLY). She complains of bloating and "twinges" of pain.   Family history of ovarian cancer - Plan: US PELVIS TRANSVAGINAL NON-OB (TV ONLY). Mother deceased from ovarian cancer.   Osteopenia, unspecified location - managed by PCP. Continue daily Vitamin D supplement and regular exercise. She plans to get back into water aerobics.   Screening for cervical cancer - Normal Pap history. No longer screening per guidelines.   Screening for breast cancer - Normal mammogram history.  Continue annual screenings.  Normal breast exam today.  Screening for colon cancer - 2017 colonoscopy. No longer screening per GI recommendations.   Return in 1 year for annual.      Tamela Gammon DNP, 10:31 AM 01/04/2021

## 2021-01-15 ENCOUNTER — Other Ambulatory Visit: Payer: Self-pay | Admitting: Family Medicine

## 2021-01-19 ENCOUNTER — Telehealth: Payer: Self-pay | Admitting: Cardiovascular Disease

## 2021-01-19 NOTE — Telephone Encounter (Signed)
Yes that will be fine.  Just ensure it is not the loratadine-D which has sudafed in it.  That is one is only available behind the pharmacy counter so I assume that's not the product shes asking about.

## 2021-01-19 NOTE — Telephone Encounter (Signed)
Patient asks if it ok if she can take Loratadine for her allergies. Please call to discuss.

## 2021-01-19 NOTE — Telephone Encounter (Signed)
Routed to Pharm to advise.

## 2021-01-19 NOTE — Telephone Encounter (Signed)
DPR on file. lmom with [facility] PharmD response and recommendation. Patient is to call back if any questions.

## 2021-01-31 ENCOUNTER — Other Ambulatory Visit: Payer: Self-pay | Admitting: Nurse Practitioner

## 2021-01-31 ENCOUNTER — Ambulatory Visit: Payer: Medicare PPO | Admitting: Obstetrics and Gynecology

## 2021-01-31 ENCOUNTER — Ambulatory Visit (INDEPENDENT_AMBULATORY_CARE_PROVIDER_SITE_OTHER): Payer: Medicare PPO

## 2021-01-31 ENCOUNTER — Encounter: Payer: Self-pay | Admitting: Obstetrics and Gynecology

## 2021-01-31 ENCOUNTER — Other Ambulatory Visit: Payer: Self-pay

## 2021-01-31 VITALS — BP 110/64 | HR 79 | Ht 64.0 in | Wt 262.0 lb

## 2021-01-31 DIAGNOSIS — Z8041 Family history of malignant neoplasm of ovary: Secondary | ICD-10-CM

## 2021-01-31 DIAGNOSIS — R140 Abdominal distension (gaseous): Secondary | ICD-10-CM

## 2021-01-31 NOTE — Progress Notes (Signed)
GYNECOLOGY  VISIT   HPI: 75 y.o.   Married White or Caucasian Not Hispanic or Latino  female   [phone] with No LMP recorded. Patient is postmenopausal.   here for ultrasound consult  The patient has been having some abdominal bloating and occasional twinges of pain in her lower abdomen/pelvis. Her mother had ovarian cancer in her late 4's and the patient was worried.   GYNECOLOGIC HISTORY: No LMP recorded. Patient is postmenopausal. Contraception: PMP  Menopausal hormone therapy: none         OB History    Gravida  2   Para  2   Term  2   Preterm      AB      Living  2     SAB      IAB      Ectopic      Multiple      Live Births                 Patient Active Problem List   Diagnosis Date Noted  . Macular degeneration 12/22/2019  . Paroxysmal atrial fibrillation ([place]) 12/11/2017  . S/P right TKA 08/07/2016  . Dysphagia 11/14/2015  . Atypical chest pain 12/27/2014  . Class 3 severe obesity due to excess calories without serious comorbidity with body mass index (BMI) of 40.0 to 44.9 in adult ([place]) 06/17/2014  . Kidney stones 06/17/2014  . Osteopenia   . Hyperlipidemia 02/09/2009  . Allergic rhinitis 05/07/2008  . VARICOSE VEINS LOWER EXTREMITIES W/INFLAMMATION 02/05/2008  . COPD (chronic obstructive pulmonary disease) ([place]) 02/05/2008  . Osteoarthritis 04/24/2007    Past Medical History:  Diagnosis Date  . Allergy   . ASCUS on Pap smear 2007  . ASTHMA UNSPECIFIED WITH EXACERBATION 11/08/2008  . Atrial fibrillation ([place])   . Cancer of skin of left ear   . Chest pain 03/2011   negative stress test - Dr. Ron Parker  . History of frequent urinary tract infections   . History of kidney stones   . [place], [place] 02/09/2009  . Kidney stones   . OSTEOARTHRITIS 04/24/2007  . Osteopenia   . Osteoporosis   . Pneumonia   . PONV (postoperative nausea and vomiting)   . SHINGLES 11/08/2008   had vaccine at drugstore 12/14  . VARICOSE VEINS LOWER  EXTREMITIES W/INFLAMMATION 02/05/2008    Past Surgical History:  Procedure Laterality Date  . Bone spur shoulder     x2 removed  . COLPOSCOPY     Neg HR HPV  . KNEE ARTHROSCOPY    . laser surgery per left ey     secondary to film covering   . lens implants- both eyes    . SPINE SURGERY  2008   lumbar  . TOTAL KNEE ARTHROPLASTY Right 08/07/2016   Procedure: RIGHT TOTAL KNEE ARTHROPLASTY;  Surgeon: Paralee Cancel, MD;  Location: [facility];  Service: Orthopedics;  Laterality: Right;    Current Outpatient Medications  Medication Sig Dispense Refill  . apixaban (ELIQUIS) 5 MG TABS tablet Take 1 tablet (5 mg total) by mouth 2 (two) times daily. 60 tablet 11  . Calcium Citrate 250 MG TABS Take 1,000 mg by mouth 2 (two) times daily.    . cetirizine (ZYRTEC) 10 MG tablet Take 10 mg by mouth at bedtime.    . Cholecalciferol (VITAMIN D3) 2000 UNITS TABS Take 2,000 Units by mouth 2 (two) times daily.    . fish oil-omega-3 fatty acids 1000 MG capsule Take 2 capsules (2  g total) by mouth 2 (two) times daily. total for 4,000 mg (Patient taking differently: Take 1 g by mouth daily.)    . FLOVENT HFA 110 MCG/ACT inhaler Inhale 2 puffs by mouth twice daily 12 g 0  . GLUCOSAMINE SULFATE PO Take 2 tablets by mouth 2 (two) times daily.     . metoprolol tartrate (LOPRESSOR) 25 MG tablet Take 1 tablet (25 mg total) by mouth 2 (two) times daily. 180 tablet 3  . Multiple Vitamin (MULTIVITAMIN) tablet Take 1 tablet by mouth daily.    Marland Kitchen PROAIR HFA 108 (90 Base) MCG/ACT inhaler INHALE TWO PUFFS INTO LUNGS EVERY 6 HOURS AS NEEDED FOR WHEEZING FOR SHORTNESS OF BREATH 9 each 5  . rosuvastatin (CRESTOR) 10 MG tablet Take 1 tablet (10 mg total) by mouth daily. 90 tablet 3   No current facility-administered medications for this visit.     ALLERGIES: Atorvastatin, Cefuroxime axetil, and Other  Family History  Problem Relation Age of Onset  . Ovarian cancer Mother   . Melanoma Mother   . Dementia Mother   .  Hypertension Father   . Heart disease Father        MI 83  . Heart disease Paternal Grandfather   . Cancer Maternal Grandmother        Oral cancer  . Down syndrome Sister   . Prostate cancer Maternal Uncle   . Heart disease Maternal Grandfather   . Prostate cancer Maternal Uncle   . Prostate cancer Maternal Uncle   . Breast cancer Cousin        two maternal cousins diagnosed <50  . Diabetes Paternal Aunt   . Sudden Cardiac Death Son        unclear cause.   . Colon cancer Neg Hx   . Esophageal cancer Neg Hx   . Rectal cancer Neg Hx   . Stomach cancer Neg Hx     Social History   Socioeconomic History  . Marital status: Married    Spouse name: Not on file  . Number of children: 2  . Years of education: Not on file  . Highest education level: Not on file  Occupational History  . Occupation: Retired    Comment: Pharmacist, hospital   Tobacco Use  . Smoking status: Passive Smoke Exposure - Never Smoker  . Smokeless tobacco: Never Used  . Tobacco comment: last exposure 1999  Vaping Use  . Vaping Use: Never used  Substance and Sexual Activity  . Alcohol use: Yes    Alcohol/week: 1.0 standard drink    Types: 1 Standard drinks or equivalent per week    Comment: monthly  . Drug use: No  . Sexual activity: Not Currently    Birth control/protection: Post-menopausal  Other Topics Concern  . Not on file  Social History Narrative   Married 44 years in 2015. 2 kids. 2 grandkids-14 and 12 in 2020.       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       Hobbies: works with grandsons in [place], Haematologist (hates housework), goes on 1 month motorcyle biking trip each year   Social Determinants of Radio broadcast assistant Strain: Not on file  Food Insecurity: Not on file  Transportation Needs: Not on file  Physical Activity: Not on file  Stress: Not on file  Social Connections: Not on file  Intimate Partner Violence: Not on file    Review of Systems  All other systems reviewed and are negative.   PHYSICAL EXAMINATION:    BP 110/64   Pulse 79   Ht 5\' 4"  (1.626 m)   Wt 262 lb (118.8 kg)   SpO2 97%   BMI 44.97 kg/m     General appearance: alert, cooperative and appears stated age  Pelvic ultrasound  Indications: abdominal bloating, family history of ovarian cancer  Findings:  Uterus 3.6 x 2.02 cn  Endometrium 0.99 mm  Left ovary 1.58 x 1.07 x 0.73 cm  Right ovary 1.58 x 0.83 x 0.94 cm  No free fluid  Impression:  Normal pelvic ultrasound  1. Abdominal bloating Normal pelvic ultrasound F/U with primary for any persistent symptoms  2. Family history of ovarian cancer Normal pelvic ultrasound. Patient reassured   CC: Linda Lowenstein, NP

## 2021-02-03 ENCOUNTER — Encounter: Payer: Self-pay | Admitting: Obstetrics and Gynecology

## 2021-02-22 ENCOUNTER — Other Ambulatory Visit: Payer: Self-pay | Admitting: Family Medicine

## 2021-03-23 ENCOUNTER — Telehealth: Payer: Self-pay

## 2021-03-23 ENCOUNTER — Other Ambulatory Visit: Payer: Self-pay

## 2021-03-23 ENCOUNTER — Ambulatory Visit (INDEPENDENT_AMBULATORY_CARE_PROVIDER_SITE_OTHER): Payer: Medicare PPO

## 2021-03-23 DIAGNOSIS — Z0000 Encounter for general adult medical examination without abnormal findings: Secondary | ICD-10-CM | POA: Diagnosis not present

## 2021-03-23 NOTE — Telephone Encounter (Signed)
Please schedule OV for pt to f/u.

## 2021-03-23 NOTE — Telephone Encounter (Signed)
Please schedule OV for pt to f/u on this.

## 2021-03-23 NOTE — Patient Instructions (Signed)
[name] , Thank you for taking time to come for your Medicare Wellness Visit. I appreciate your ongoing commitment to your health goals. Please review the following plan we discussed and let me know if I can assist you in the future.   Screening recommendations/referrals: Colonoscopy: Done 12/01/15 Mammogram: Done 12/26/20 Bone Density: Done 12/26/20 Recommended yearly ophthalmology/optometry visit for glaucoma screening and checkup Recommended yearly dental visit for hygiene and checkup  Vaccinations: Influenza vaccine: Up to date Pneumococcal vaccine: Up to date Tdap vaccine: Up to date Shingles vaccine: Shingrix discussed. Please contact your pharmacy for coverage information.    Covid-19:Completed 2/4, 2/25, & 10/26/20  Advanced directives: Advance directive discussed with you today. I have provided a copy for you to complete at home and have notarized. Once this is complete please bring a copy in to our office so we can scan it into your chart.  Conditions/risks identified: lose weight   Next appointment: Follow up in one year for your annual wellness visit    Preventive Care 65 Years and Older, Female Preventive care refers to lifestyle choices and visits with your health care provider that can promote health and wellness. What does preventive care include? A yearly physical exam. This is also called an annual well check. Dental exams once or twice a year. Routine eye exams. Ask your health care provider how often you should have your eyes checked. Personal lifestyle choices, including: Daily care of your teeth and gums. Regular physical activity. Eating a healthy diet. Avoiding tobacco and drug use. Limiting alcohol use. Practicing safe sex. Taking low-dose aspirin every day. Taking vitamin and mineral supplements as recommended by your health care provider. What happens during an annual well check? The services and screenings done by your health care provider during your  annual well check will depend on your age, overall health, lifestyle risk factors, and family history of disease. Counseling  Your health care provider may ask you questions about your: Alcohol use. Tobacco use. Drug use. Emotional well-being. Home and relationship well-being. Sexual activity. Eating habits. History of falls. Memory and ability to understand (cognition). Work and work Statistician. Reproductive health. Screening  You may have the following tests or measurements: Height, weight, and BMI. Blood pressure. Lipid and cholesterol levels. These may be checked every 5 years, or more frequently if you are over 17 years old. Skin check. Lung cancer screening. You may have this screening every year starting at age 38 if you have a 30-pack-year history of smoking and currently smoke or have quit within the past 15 years. Fecal occult blood test (FOBT) of the stool. You may have this test every year starting at age 99. Flexible sigmoidoscopy or colonoscopy. You may have a sigmoidoscopy every 5 years or a colonoscopy every 10 years starting at age 85. Hepatitis C blood test. Hepatitis B blood test. Sexually transmitted disease (STD) testing. Diabetes screening. This is done by checking your blood sugar (glucose) after you have not eaten for a while (fasting). You may have this done every 1-3 years. Bone density scan. This is done to screen for osteoporosis. You may have this done starting at age 54. Mammogram. This may be done every 1-2 years. Talk to your health care provider about how often you should have regular mammograms. Talk with your health care provider about your test results, treatment options, and if necessary, the need for more tests. Vaccines  Your health care provider may recommend certain vaccines, such as: Influenza vaccine. This is recommended  every year. Tetanus, diphtheria, and acellular pertussis (Tdap, Td) vaccine. You may need a Td booster every 10  years. Zoster vaccine. You may need this after age 108. Pneumococcal 13-valent conjugate (PCV13) vaccine. One dose is recommended after age 66. Pneumococcal polysaccharide (PPSV23) vaccine. One dose is recommended after age 40. Talk to your health care provider about which screenings and vaccines you need and how often you need them. This information is not intended to replace advice given to you by your health care provider. Make sure you discuss any questions you have with your health care provider. Document Released: 10/28/2015 Document Revised: 06/20/2016 Document Reviewed: 08/02/2015 Elsevier Interactive Patient Education  2017 [employer] Prevention in the Home Falls can cause injuries. They can happen to people of all ages. There are many things you can do to make your home safe and to help prevent falls. What can I do on the outside of my home? Regularly fix the edges of walkways and driveways and fix any cracks. Remove anything that might make you trip as you walk through a door, such as a raised step or threshold. Trim any bushes or trees on the path to your home. Use bright outdoor lighting. Clear any walking paths of anything that might make someone trip, such as rocks or tools. Regularly check to see if handrails are loose or broken. Make sure that both sides of any steps have handrails. Any raised decks and porches should have guardrails on the edges. Have any leaves, snow, or ice cleared regularly. Use sand or salt on walking paths during winter. Clean up any spills in your garage right away. This includes oil or grease spills. What can I do in the bathroom? Use night lights. Install grab bars by the toilet and in the tub and shower. Do not use towel bars as grab bars. Use non-skid mats or decals in the tub or shower. If you need to sit down in the shower, use a plastic, non-slip stool. Keep the floor dry. Clean up any water that spills on the floor as soon as it  happens. Remove soap buildup in the tub or shower regularly. Attach bath mats securely with double-sided non-slip rug tape. Do not have throw rugs and other things on the floor that can make you trip. What can I do in the bedroom? Use night lights. Make sure that you have a light by your bed that is easy to reach. Do not use any sheets or blankets that are too big for your bed. They should not hang down onto the floor. Have a firm chair that has side arms. You can use this for support while you get dressed. Do not have throw rugs and other things on the floor that can make you trip. What can I do in the kitchen? Clean up any spills right away. Avoid walking on wet floors. Keep items that you use a lot in easy-to-reach places. If you need to reach something above you, use a strong step stool that has a grab bar. Keep electrical cords out of the way. Do not use floor polish or wax that makes floors slippery. If you must use wax, use non-skid floor wax. Do not have throw rugs and other things on the floor that can make you trip. What can I do with my stairs? Do not leave any items on the stairs. Make sure that there are handrails on both sides of the stairs and use them. Fix handrails that are broken  or loose. Make sure that handrails are as long as the stairways. Check any carpeting to make sure that it is firmly attached to the stairs. Fix any carpet that is loose or worn. Avoid having throw rugs at the top or bottom of the stairs. If you do have throw rugs, attach them to the floor with carpet tape. Make sure that you have a light switch at the top of the stairs and the bottom of the stairs. If you do not have them, ask someone to add them for you. What else can I do to help prevent falls? Wear shoes that: Do not have high heels. Have rubber bottoms. Are comfortable and fit you well. Are closed at the toe. Do not wear sandals. If you use a stepladder: Make sure that it is fully opened.  Do not climb a closed stepladder. Make sure that both sides of the stepladder are locked into place. Ask someone to hold it for you, if possible. Clearly mark and make sure that you can see: Any grab bars or handrails. First and last steps. Where the edge of each step is. Use tools that help you move around (mobility aids) if they are needed. These include: Canes. Walkers. Scooters. Crutches. Turn on the lights when you go into a dark area. Replace any light bulbs as soon as they burn out. Set up your furniture so you have a clear path. Avoid moving your furniture around. If any of your floors are uneven, fix them. If there are any pets around you, be aware of where they are. Review your medicines with your doctor. Some medicines can make you feel dizzy. This can increase your chance of falling. Ask your doctor what other things that you can do to help prevent falls. This information is not intended to replace advice given to you by your health care provider. Make sure you discuss any questions you have with your health care provider. Document Released: 07/28/2009 Document Revised: 03/08/2016 Document Reviewed: 11/05/2014 Elsevier Interactive Patient Education  2017 [employer].

## 2021-03-23 NOTE — Progress Notes (Signed)
Virtual Visit via Telephone Note  I connected with  [name] on 03/23/21 at 10:15 AM EDT by telephone and verified that I am speaking with the correct person using two identifiers.  Medicare Annual Wellness visit completed telephonically due to Covid-19 pandemic.   Persons participating in this call: This Health Coach and this patient.   Location: Patient: Home Provider: Office   I discussed the limitations, risks, security and privacy concerns of performing an evaluation and management service by telephone and the availability of in person appointments. The patient expressed understanding and agreed to proceed.  Unable to perform video visit due to video visit attempted and failed and/or patient does not have video capability.   Some vital signs may be absent or patient reported.   Willette Brace, LPN   Subjective:   [name] is a 75 y.o. female who presents for Medicare Annual (Subsequent) preventive examination.  Review of Systems     Cardiac Risk Factors include: advanced age (>53men, >40 women);dyslipidemia;obesity (BMI >30kg/m2)     Objective:    There were no vitals filed for this visit. There is no height or weight on file to calculate BMI.  Advanced Directives 03/23/2021 12/22/2019 10/31/2017 08/07/2016 08/07/2016 07/31/2016 12/01/2015  Does Patient Have a Medical Advance Directive? No No No No No No No  Would patient like information on creating a medical advance directive? Yes (MAU/Ambulatory/Procedural Areas - Information given) No - Patient declined - No - patient declined information No - patient declined information No - patient declined information -    Current Medications (verified) Outpatient Encounter Medications as of 03/23/2021  Medication Sig   apixaban (ELIQUIS) 5 MG TABS tablet Take 1 tablet (5 mg total) by mouth 2 (two) times daily.   Calcium Citrate 250 MG TABS Take 1,000 mg by mouth 2 (two) times daily.   Cholecalciferol (VITAMIN D3) 2000 UNITS  TABS Take 2,000 Units by mouth 2 (two) times daily.   fish oil-omega-3 fatty acids 1000 MG capsule Take 2 capsules (2 g total) by mouth 2 (two) times daily. total for 4,000 mg (Patient taking differently: Take 1 g by mouth daily.)   FLOVENT HFA 110 MCG/ACT inhaler Inhale 2 puffs by mouth twice daily   GLUCOSAMINE SULFATE PO Take 2 tablets by mouth 2 (two) times daily.    Levocetirizine Dihydrochloride (XYZAL ALLERGY 24HR PO) Take by mouth.   metoprolol tartrate (LOPRESSOR) 25 MG tablet Take 1 tablet (25 mg total) by mouth 2 (two) times daily.   Multiple Vitamin (MULTIVITAMIN) tablet Take 1 tablet by mouth daily.   PROAIR HFA 108 (90 Base) MCG/ACT inhaler INHALE TWO PUFFS INTO LUNGS EVERY 6 HOURS AS NEEDED FOR WHEEZING FOR SHORTNESS OF BREATH   rosuvastatin (CRESTOR) 10 MG tablet Take 1 tablet (10 mg total) by mouth daily.   [DISCONTINUED] cetirizine (ZYRTEC) 10 MG tablet Take 10 mg by mouth at bedtime. (Patient not taking: Reported on 03/23/2021)   No facility-administered encounter medications on file as of 03/23/2021.    Allergies (verified) Atorvastatin, Cefuroxime axetil, and Other   History: Past Medical History:  Diagnosis Date   Allergy    ASCUS on Pap smear 2007   ASTHMA UNSPECIFIED WITH EXACERBATION 11/08/2008   Atrial fibrillation (HCC)    Cancer of skin of left ear    Chest pain 03/2011   negative stress test - Dr. Ron Parker   History of frequent urinary tract infections    History of kidney stones    HYPERLIPIDEMIA, BORDERLINE  02/09/2009   Kidney stones    OSTEOARTHRITIS 04/24/2007   Osteopenia    Osteoporosis    Pneumonia    PONV (postoperative nausea and vomiting)    SHINGLES 11/08/2008   had vaccine at drugstore 12/14   VARICOSE VEINS LOWER EXTREMITIES W/INFLAMMATION 02/05/2008   Past Surgical History:  Procedure Laterality Date   Bone spur shoulder     x2 removed   COLPOSCOPY     Neg HR HPV   KNEE ARTHROSCOPY     laser surgery per left ey     secondary to film  covering    lens implants- both eyes     SPINE SURGERY  2008   lumbar   TOTAL KNEE ARTHROPLASTY Right 08/07/2016   Procedure: RIGHT TOTAL KNEE ARTHROPLASTY;  Surgeon: Paralee Cancel, MD;  Location: [facility];  Service: Orthopedics;  Laterality: Right;   Family History  Problem Relation Age of Onset   Ovarian cancer Mother    Melanoma Mother    Dementia Mother    Hypertension Father    Heart disease Father        MI 38   Heart disease Paternal Grandfather    Cancer Maternal Grandmother        Oral cancer   Down syndrome Sister    Prostate cancer Maternal Uncle    Heart disease Maternal Grandfather    Prostate cancer Maternal Uncle    Prostate cancer Maternal Uncle    Breast cancer Cousin        two maternal cousins diagnosed <50   Diabetes Paternal Aunt    Sudden Cardiac Death Son        unclear cause.    Colon cancer Neg Hx    Esophageal cancer Neg Hx    Rectal cancer Neg Hx    Stomach cancer Neg Hx    Social History   Socioeconomic History   Marital status: Married    Spouse name: Not on file   Number of children: 2   Years of education: Not on file   Highest education level: Not on file  Occupational History   Occupation: Retired    Comment: Pharmacist, hospital   Tobacco Use   Smoking status: Passive Smoke Exposure - Never Smoker   Smokeless tobacco: Never   Tobacco comments:    last exposure 1999  Vaping Use   Vaping Use: Never used  Substance and Sexual Activity   Alcohol use: Yes    Alcohol/week: 1.0 standard drink    Types: 1 Standard drinks or equivalent per week    Comment: monthly   Drug use: No   Sexual activity: Not Currently    Birth control/protection: Post-menopausal  Other Topics Concern   Not on file  Social History Narrative   Married 44 years in 2015. 2 kids. 2 grandkids-14 and 12 in 2020.       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       Hobbies: works with grandsons in [place], Haematologist (hates housework), goes  on 1 month motorcyle biking trip each year   Social Determinants of Radio broadcast assistant Strain: Low Risk    Difficulty of Paying Living Expenses: Not hard at all  Food Insecurity: No Food Insecurity   Worried About Charity fundraiser in the Last Year: Never true   Arboriculturist in the Last Year: Never true  Transportation Needs: No Transportation Needs   Lack of  Transportation (Medical): No   Lack of Transportation (Non-Medical): No  Physical Activity: Inactive   Days of Exercise per Week: 0 days   Minutes of Exercise per Session: 0 min  Stress: No Stress Concern Present   Feeling of Stress : Not at all  Social Connections: Moderately Integrated   Frequency of Communication with Friends and Family: More than three times a week   Frequency of Social Gatherings with Friends and Family: More than three times a week   Attends Religious Services: 1 to 4 times per year   Active Member of [employer] or Organizations: No   Attends Music therapist: Never   Marital Status: Married    Tobacco Counseling Counseling given: Not Answered Tobacco comments: last exposure 1999   Clinical Intake:  Pre-visit preparation completed: Yes  Pain : No/denies pain     BMI - recorded: 44.97 Nutritional Status: BMI > 30  Obese Nutritional Risks: None Diabetes: No  How often do you need to have someone help you when you read instructions, pamphlets, or other written materials from your doctor or pharmacy?: 1 - Never  Diabetic?No  Interpreter Needed?: No  Information entered by :: Charlott Rakes, LPN   Activities of Daily Living In your present state of health, do you have any difficulty performing the following activities: 03/23/2021 12/23/2020  Hearing? N N  Comment - Ringing in her ears.  Vision? N N  Difficulty concentrating or making decisions? N N  Walking or climbing stairs? N N  Dressing or bathing? N N  Doing errands, shopping? N N  Preparing Food and eating ?  N -  Using the Toilet? N -  In the past six months, have you accidently leaked urine? N -  Do you have problems with loss of bowel control? N -  Managing your Medications? N -  Managing your Finances? N -  Housekeeping or managing your Housekeeping? N -  Some recent data might be hidden    Patient Care Team: Marin Olp, MD as PCP - General (Family Medicine) Wellington Hampshire, MD as PCP - Cardiology (Cardiology) Huel Cote, NP (Inactive) as Nurse Practitioner (Obstetrics and Gynecology) Wellington Hampshire, MD as Consulting Physician (Cardiology)  Indicate any recent Medical Services you may have received from other than [facility] providers in the past year (date may be approximate).     Assessment:   This is a routine wellness examination for [place]avia.  Hearing/Vision screen Hearing Screening - Comments:: Pt denies any hearing issues  Vision Screening - Comments:: Pt follows up with Dr Truman [facility]ward at [facility] for annual eye exams   Dietary issues and exercise activities discussed: Current Exercise Habits: The patient does not participate in regular exercise at present (works around yard and house work)   Goals Addressed             This Visit's Progress    Patient Stated       Lose weight         Depression Screen PHQ 2/9 Scores 03/23/2021 12/23/2020 12/22/2019 12/22/2019 12/16/2018 12/11/2017 10/31/2017  PHQ - 2 Score 0 0 0 0 0 0 0    Fall Risk Fall Risk  03/23/2021 12/23/2020 12/22/2019 12/16/2018 12/11/2017  Falls in the past year? 1 1 0 0 No  Number falls in past yr: 1 0 0 0 -  Injury with Fall? 1 0 0 0 -  Comment scrape knees - - - -  Risk for fall due to : Impaired  vision - - - -  Follow up Falls prevention discussed - Falls evaluation completed;Education provided;Falls prevention discussed - -    FALL RISK PREVENTION PERTAINING TO THE HOME:  Any stairs in or around the home? Yes  If so, are there any without handrails? No  Home free of loose throw rugs in walkways, pet beds,  electrical cords, etc? Yes  Adequate lighting in your home to reduce risk of falls? Yes   ASSISTIVE DEVICES UTILIZED TO PREVENT FALLS:  Life alert? No  Use of a cane, walker or w/c? No  Grab bars in the bathroom? Yes  Shower chair or bench in shower? Yes  Elevated toilet seat or a handicapped toilet? No   TIMED UP AND GO:  Was the test performed? No    Cognitive Function: MMSE - Mini Mental State Exam 10/31/2017  Not completed: (No Data)     6CIT Screen 03/23/2021 12/22/2019  What Year? 0 points 0 points  What month? 0 points 0 points  What time? 0 points 0 points  Count back from 20 0 points 0 points  Months in reverse 0 points 0 points  Repeat phrase 0 points 0 points  Total Score 0 0    Immunizations Immunization History  Administered Date(s) Administered   Fluad Quad(high Dose 65+) 08/18/2020   Influenza Whole 08/15/2000, 08/06/2008   Influenza, High Dose Seasonal PF 10/25/2014, 07/16/2016, 07/16/2017, 08/13/2018   Influenza,inj,Quad PF,6+ Mos 08/01/2015, 07/30/2019   PFIZER(Purple Top)SARS-COV-2 Vaccination 11/19/2019, 12/10/2019, 10/26/2020   PPD Test 07/05/2014   Pneumococcal Conjugate-13 10/31/2015   Pneumococcal Polysaccharide-23 08/15/2000, 06/08/2013   Td 09/14/1998, 02/05/2008   Tdap 06/16/2019   Zoster, Live 11/06/2013    TDAP status: Up to date  Flu Vaccine status: Up to date  Pneumococcal vaccine status: Up to date  Covid-19 vaccine status: Completed vaccines  Qualifies for Shingles Vaccine? Yes   Zostavax completed Yes   Shingrix Completed?: No.    Education has been provided regarding the importance of this vaccine. Patient has been advised to call insurance company to determine out of pocket expense if they have not yet received this vaccine. Advised may also receive vaccine at local pharmacy or Health Dept. Verbalized acceptance and understanding.  Screening Tests Health Maintenance  Topic Date Due   Zoster Vaccines- Shingrix (1 of 2) Never  done   COVID-19 Vaccine (4 - Booster for Pfizer series) 02/23/2021   INFLUENZA VACCINE  05/15/2021   MAMMOGRAM  12/27/2022   COLONOSCOPY (Pts 45-59yrs Insurance coverage will need to be confirmed)  11/30/2025   TETANUS/TDAP  06/15/2029   DEXA SCAN  Completed   Hepatitis C Screening  Completed   PNA vac Low Risk Adult  Completed   Pneumococcal Vaccine 28-80 Years old  Aged Out   HPV [place] Maintenance Due  Topic Date Due   Zoster Vaccines- Shingrix (1 of 2) Never done   COVID-19 Vaccine (4 - Booster for Pfizer series) 02/23/2021    Colorectal cancer screening: Type of screening: Colonoscopy. Completed 12/01/15. Repeat every 10 years  Mammogram status: Completed 12/26/20. Repeat every year  Bone Density status: Completed 12/26/20. Results reflect: Bone density results: OSTEOPENIA. Repeat every 2 years.   Additional Screening:  Hepatitis C Screening:  Completed 10/26/15  Vision Screening: Recommended annual ophthalmology exams for early detection of glaucoma and other disorders of the eye. Is the patient up to date with their annual eye exam?  Yes  Who is  the provider or what is the name of the office in which the patient attends annual eye exams? Dr Truman Hayward If pt is not established with a provider, would they like to be referred to a provider to establish care? No .   Dental Screening: Recommended annual dental exams for proper oral hygiene  Community Resource Referral / Chronic Care Management: CRR required this visit?  No   CCM required this visit?  No      Plan:     I have personally reviewed and noted the following in the patient's chart:   Medical and social history Use of alcohol, tobacco or illicit drugs  Current medications and supplements including opioid prescriptions.  Functional ability and status Nutritional status Physical activity Advanced directives List of other physicians Hospitalizations, surgeries, and ER  visits in previous 12 months Vitals Screenings to include cognitive, depression, and falls Referrals and appointments  In addition, I have reviewed and discussed with patient certain preventive protocols, quality metrics, and best practice recommendations. A written personalized care plan for preventive services as well as general preventive health recommendations were provided to patient.     Willette Brace, LPN   11/16/1796   Nurse Notes: pt stated she has been having light headedness in a.m and before bed and states she will call to make an appt to Follow up.

## 2021-03-23 NOTE — Telephone Encounter (Signed)
Nurse Assessment Nurse: Raphael Gibney, RN, Vanita Ingles Date/Time (Eastern Time): 03/23/2021 10:49:44 AM Confirm and document reason for call. If symptomatic, describe symptoms. ---Caller states she has recently started on xyzal about a month ago. for the past 10 days, she gets dizzy laying down. When she gets up to go to the bathroom, she has to sit on the side of the bed as she is dizzy. Dizziness does not last long Does the patient have any new or worsening symptoms? ---Yes Will a triage be completed? ---Yes Related visit to physician within the last 2 weeks? ---No Does the PT have any chronic conditions? (i.e. diabetes, asthma, this includes High risk factors for pregnancy, etc.) ---Yes List chronic conditions. ---asthma; A fib Is this a behavioral health or substance abuse call? ---No Guidelines Guideline Title Affirmed Question Affirmed Notes Nurse Date/Time (Eastern Time) Dizziness - Vertigo [1] NO dizziness now AND [2] age > 34 Raphael Gibney, [place], [place] 03/23/2021 10:54:28 AM Disp. Time Eilene Ghazi Time) Disposition Final User PLEASE NOTE: All timestamps contained within this report are represented as [place] Standard Time. CONFIDENTIALTY NOTICE: This fax transmission is intended only for the addressee. It contains information that is legally privileged, confidential or otherwise protected from use or disclosure. If you are not the intended recipient, you are strictly prohibited from reviewing, disclosing, copying using or disseminating any of this information or taking any action in reliance on or regarding this information. If you have received this fax in error, please notify us immediately by telephone so that we can arrange for its return to [place]. Phone: [phone], Toll-Free: [phone], Fax: [phone] Page: 2 of 2 Call Id: [id number] 03/23/2021 11:01:23 AM See PCP within 24 Hours Yes Raphael Gibney, RN, Doreatha Lew Disagree/Comply Disagree Caller Understands Yes PreDisposition Call Doctor Care  Advice Given Per Guideline SEE PCP WITHIN 24 HOURS: * IF OFFICE WILL BE OPEN: You need to be examined within the next 24 hours. Call your doctor (or NP/PA) when the office opens and make an appointment. FALL PREVENTION: * Sit at side of bed for several minutes before standing up. Stand up slowly. CALL BACK IF: * Severe headache occurs * Weakness develops in an arm or leg * Unable to walk without falling * You become worse CARE ADVICE given per Dizziness - Vertigo (Adult) guideline. Comments User: Dannielle Burn, RN Date/Time Eilene Ghazi Time): 03/23/2021 11:00:35 AM pt does not want to make appt but wants to stop taking her xyzal and is going to start her zyrtec to see if dizziness stops. If it does not, she will make appt.  GO TO FACILITY REFUSED

## 2021-03-24 NOTE — Telephone Encounter (Signed)
Called patient and she states that she is discontinuing her allergy pills as she believes this is the cause. [name] states she will call back on Monday if she isnt feeling any better.

## 2021-03-28 ENCOUNTER — Other Ambulatory Visit: Payer: Self-pay | Admitting: Family Medicine

## 2021-05-05 ENCOUNTER — Other Ambulatory Visit: Payer: Self-pay | Admitting: Family Medicine

## 2021-06-07 ENCOUNTER — Other Ambulatory Visit: Payer: Self-pay | Admitting: Family Medicine

## 2021-06-13 ENCOUNTER — Telehealth: Payer: Self-pay | Admitting: Cardiovascular Disease

## 2021-06-13 NOTE — Telephone Encounter (Addendum)
Spoke with the patient. Adv her that it would be ok to take benadryl as needed. Adv the patient that she should not operate a motor vehicle while taking benadryl. Patient verbalized understanding and voiced appreciation for the call back.

## 2021-06-13 NOTE — Telephone Encounter (Signed)
Patient and husband were stung by yellow jacks and wants to know if can take benadryl with medications already taking

## 2021-06-28 NOTE — Progress Notes (Signed)
Phone [phone] In person visit   Subjective:   [name] is a 75 y.o. year old very pleasant female patient who presents for/with See problem oriented charting Chief Complaint  Patient presents with   Rash    Patient states that she was having red spots all over her legs and they were creeping up to the thigh area. They are kind of gone away now but she still would like for you to look at them    leg cramps    Patient states that she feels like her left leg is pulling when she tries to get up sometimes. Sometimes it pulls around her knee. She felt as if her leg was almost going to give out on her on Tuesday. The longer she sit down and when she gets up it gets worst.     This visit occurred during the SARS-CoV-2 public health emergency.  Safety protocols were in place, including screening questions prior to the visit, additional usage of staff PPE, and extensive cleaning of exam room while observing appropriate contact time as indicated for disinfecting solutions.   Past Medical History-  Patient Active Problem List   Diagnosis Date Noted   Paroxysmal atrial fibrillation ([place]) 12/11/2017    Priority: High   Macular degeneration 12/22/2019    Priority: Medium   Atypical chest pain 12/27/2014    Priority: Medium   Hyperlipidemia 02/09/2009    Priority: Medium   COPD (chronic obstructive pulmonary disease) ([place]) 02/05/2008    Priority: Medium   S/P right TKA 08/07/2016    Priority: Low   Dysphagia 11/14/2015    Priority: Low   Class 3 severe obesity due to excess calories without serious comorbidity with body mass index (BMI) of 40.0 to 44.9 in adult ([place]) 06/17/2014    Priority: Low   Kidney stones 06/17/2014    Priority: Low   Osteopenia     Priority: Low   Allergic rhinitis 05/07/2008    Priority: Low   VARICOSE VEINS LOWER EXTREMITIES W/INFLAMMATION 02/05/2008    Priority: Low   Osteoarthritis 04/24/2007    Priority: Low    Medications- reviewed and  updated Current Outpatient Medications  Medication Sig Dispense Refill   apixaban (ELIQUIS) 5 MG TABS tablet Take 1 tablet (5 mg total) by mouth 2 (two) times daily. 60 tablet 11   Calcium Citrate 250 MG TABS Take 1,000 mg by mouth 2 (two) times daily.     Cholecalciferol (VITAMIN D3) 2000 UNITS TABS Take 2,000 Units by mouth 2 (two) times daily.     fish oil-omega-3 fatty acids 1000 MG capsule Take 2 capsules (2 g total) by mouth 2 (two) times daily. total for 4,000 mg (Patient taking differently: Take 1 g by mouth daily.)     FLOVENT HFA 110 MCG/ACT inhaler Inhale 2 puffs by mouth twice daily 12 g 0   fluticasone (FLONASE) 50 MCG/ACT nasal spray Place 2 sprays into both nostrils daily. 16 g 3   GLUCOSAMINE SULFATE PO Take 2 tablets by mouth 2 (two) times daily.      Levocetirizine Dihydrochloride (XYZAL ALLERGY 24HR PO) Take by mouth.     metoprolol tartrate (LOPRESSOR) 25 MG tablet Take 1 tablet (25 mg total) by mouth 2 (two) times daily. 180 tablet 3   Multiple Vitamin (MULTIVITAMIN) tablet Take 1 tablet by mouth daily.     PROAIR HFA 108 (90 Base) MCG/ACT inhaler INHALE TWO PUFFS INTO LUNGS EVERY 6 HOURS AS NEEDED FOR WHEEZING FOR SHORTNESS  OF BREATH 9 each 5   rosuvastatin (CRESTOR) 10 MG tablet Take 1 tablet (10 mg total) by mouth daily. 90 tablet 3   No current facility-administered medications for this visit.     Objective:  BP 107/68   Pulse 61   Temp 98.2 F (36.8 C) (Temporal)   Ht '5\' 4"'$  (1.626 m)   Wt 249 lb (112.9 kg)   SpO2 99%   BMI 42.74 kg/m  Gen: NAD, resting comfortably CV: RRR no murmurs rubs or gallops Lungs: CTAB no crackles, wheeze, rhonchi Abdomen: soft/nontender/nondistended/normal bowel sounds. No rebound or guarding.  Ext: trace edema- varicose veins noted.  Updated behind left hamstring, left knee, left upper calf without pain.  No edema unilateral on the left side.  No central calf pain.negative homans sign Skin: warm, dry, small macules noted-  nonblanching- possible petechiae vs. Venous stasis skin changes     Assessment and Plan   #Rash S:  History from CMA "Patient states that she was having red spots all over her legs and they were creeping up to the thigh area. They are kind of gone away now but she still would like for you to look at them "   symptoms started a year ago with "red freckles" that were pruritic. She wen to [place] 3 weeks ago - worsened right before trip and was noting going up the leg more- in garden, woods etc. Small red itchy patches on the skin- macules. Was using some off spray- has not used recently and improving.   ROS-not ill appearing, no fever/chills. No new medications. Not immunocompromised. No mucus membrane involvement.  A/P:Rash of unclear etiology without red flags.  On exam appeared to be possible petechiae in some areas-we will check a CBC to make sure her platelets okay.  Thankful these are improving-could also be a reaction to the off spray she was using.  If she has new or worsening symptoms she will let us know-we could also do a referral to dermatology if blood work is reassuring.  # Left Leg pain S:History by CMA "  Patient states that she feels like her left leg is pulling when she tries to get up sometimes. Sometimes it pulls around her knee. She felt as if her leg was almost going to give out on her on Tuesday. The longer she sit down and when she gets up it gets worst. "  No fall or injury. History of varicose veins on both legs- not more prominent on left- actually the right. Started on Tuesday when standing up. Had some pain in her low back on the left at that time as well into buttocks. Has not had lingering instability issues but does still hurt with standing- very mild pulling - no calf pain or swelling other than pain at top of calf when she stands and into bottom of her hamstring and behind knee.   No heart or ice or tylenol  A/P: 75 year old female with left lower upper leg  pain and upper lower leg pain with standing or pulling her leg up to her self-suspect mild muscular strain.  Recommended trying heating pad or ice 20 minutes 3-4 times a day as well as Tylenol as needed through the day.  We discussed possible referral to physical therapy-she wants to hold off for now.  We discussed possible D-dimer test to evaluate for blood clot but she has no calf pain at rest or any swelling and no history of clot-she would prefer to hold  off on this test for now. -if she has new or worsening symptoms she should let us know - if fails to improve in next 1-2 weeks get her into sports medicine or physical therapy- she will let me know   # COPD /allergies S: Patient reported stable shortness of breath and cough. Does get sinus drainage most of the year and postnasal drip that caused cough.  -Stayed on zyrtec in past- stopped zyrtec and symptoms did not worsen. Tried xyzal but got diarrhea mild. Went back to loratadine '10mg'$ - was not helpful -not doing flonase lately  - still getting sinus drainage even  antihistamine together.  Maintenance medications: Flovent 110 mcg 2 puffs twice daily. Breathing has been stable. Patient has had to use albuterol 0 x per day. no oxygen at home   A/P: COPD appears stable-continue Flovent.  Unfortunately her allergies are not well controlled.  We are going to have her try a combination of loratadine with Flonase.  I sent the Flonase in and she can take loratadine over-the-counter.  # Atrial fibrillation- follows with Dr. Fletcher Anon S: Rate controlled with metoprolol 25 mg twice daily Anticoagulated with Eliquis 5 mg twice daily A/P: Appropriately anticoagulated and rate controlled-continue current medication  #hyperlipidemia S: medication: Rosuvastatin 10 mg, fish oil 1000 mg daily  Lab Results  Component Value Date   CHOL 143 12/23/2020   HDL 56.00 12/23/2020   LDLCALC 72 12/23/2020   LDLDIRECT 149.5 10/24/2012   TRIG 75.0 12/23/2020   CHOLHDL  3 12/23/2020   A/P:  Lipids panel - 03/22- very close to ideal goal of 70 or less at 72 Controlled. Continue current medications.    # Hyperglycemia/insulin resistance/prediabetes S:  Medication: none A1c panel 03/22- of 5.9 up from 5.8 (at risk from 5.7-6.4 Exercise and diet- doing some yard work- no intentional exercise outside of that- working on getting bike inside house. Down 13 lbs from last visit- congratulated her effort.  Lab Results  Component Value Date   HGBA1C 5.9 12/23/2020   HGBA1C 5.8 12/22/2019   A/P: significant weight loss- update a1c with labs today  # Abdominal bloating  S:pt seen gyn, Dr.Jertson on 01/31/2021 for an ultrasound consultation. Patient reported having some abdominal bloating and occasional twinges of pain in her lower abdomen/pelvis.  -She also reported her mother had ovarian cancer in her late 78s and patient was worried.    -imaging of pelvis for abdominal bloating and history of ovarian cancer showed normal pelvic ultrasound.  A/P: Thrilled for reassuring report-continue to follow with gynecology if continued concern  Recommended follow up: No follow-ups on file. Future Appointments  Date Time Provider [place]  12/27/2021  9:20 AM Marin Olp, MD [facility]  01/04/2022  9:15 AM Hayden Pedro, MD [facility] None  03/29/2022 10:15 AM [facility] HEALTH COACH [facility]    Lab/Order associations:   ICD-10-CM   1. Paroxysmal atrial fibrillation (HCC)  I48.0     2. Centrilobular emphysema ([place])  J43.2     3. Hyperlipidemia, unspecified hyperlipidemia type  E78.5 CBC with Differential/Platelet    Comprehensive metabolic panel    4. Hyperglycemia  R73.9 Hemoglobin A1c    5. Chronic obstructive pulmonary disease, unspecified COPD type ([place])  J44.9     6. Rash  R21 CBC with Differential/Platelet    Comprehensive metabolic panel     Meds ordered this encounter  Medications   fluticasone (FLONASE) 50 MCG/ACT nasal spray     Sig: Place 2 sprays into  both nostrils daily.    Dispense:  16 g    Refill:  3   I,Jada Bradford,acting as a scribe for Garret Reddish, MD.,have documented all relevant documentation on the behalf of Garret Reddish, MD,as directed by  Garret Reddish, MD while in the presence of Garret Reddish, MD.  I, Garret Reddish, MD, have reviewed all documentation for this visit. The documentation on 06/30/21 for the exam, diagnosis, procedures, and orders are all accurate and complete.  Return precautions advised.  Garret Reddish, MD

## 2021-06-30 ENCOUNTER — Encounter: Payer: Self-pay | Admitting: Family Medicine

## 2021-06-30 ENCOUNTER — Ambulatory Visit: Payer: Medicare PPO | Admitting: Family Medicine

## 2021-06-30 ENCOUNTER — Other Ambulatory Visit: Payer: Self-pay

## 2021-06-30 VITALS — BP 107/68 | HR 61 | Temp 98.2°F | Ht 64.0 in | Wt 249.0 lb

## 2021-06-30 DIAGNOSIS — R21 Rash and other nonspecific skin eruption: Secondary | ICD-10-CM | POA: Diagnosis not present

## 2021-06-30 DIAGNOSIS — R739 Hyperglycemia, unspecified: Secondary | ICD-10-CM | POA: Diagnosis not present

## 2021-06-30 DIAGNOSIS — J449 Chronic obstructive pulmonary disease, unspecified: Secondary | ICD-10-CM

## 2021-06-30 DIAGNOSIS — J432 Centrilobular emphysema: Secondary | ICD-10-CM

## 2021-06-30 DIAGNOSIS — E785 Hyperlipidemia, unspecified: Secondary | ICD-10-CM | POA: Diagnosis not present

## 2021-06-30 DIAGNOSIS — I480 Paroxysmal atrial fibrillation: Secondary | ICD-10-CM | POA: Diagnosis not present

## 2021-06-30 LAB — CBC WITH DIFFERENTIAL/PLATELET
Basophils Absolute: 0.1 10*3/uL (ref 0.0–0.1)
Basophils Relative: 0.8 % (ref 0.0–3.0)
Eosinophils Absolute: 0.1 10*3/uL (ref 0.0–0.7)
Eosinophils Relative: 0.9 % (ref 0.0–5.0)
HCT: 39.1 % (ref 36.0–46.0)
Hemoglobin: 12.7 g/dL (ref 12.0–15.0)
Lymphocytes Relative: 15.7 % (ref 12.0–46.0)
Lymphs Abs: 1 10*3/uL (ref 0.7–4.0)
MCHC: 32.4 g/dL (ref 30.0–36.0)
MCV: 96 fl (ref 78.0–100.0)
Monocytes Absolute: 0.5 10*3/uL (ref 0.1–1.0)
Monocytes Relative: 7.9 % (ref 3.0–12.0)
Neutro Abs: 4.8 10*3/uL (ref 1.4–7.7)
Neutrophils Relative %: 74.7 % (ref 43.0–77.0)
Platelets: 156 10*3/uL (ref 150.0–400.0)
RBC: 4.08 Mil/uL (ref 3.87–5.11)
RDW: 14 % (ref 11.5–15.5)
WBC: 6.4 10*3/uL (ref 4.0–10.5)

## 2021-06-30 LAB — COMPREHENSIVE METABOLIC PANEL
ALT: 21 U/L (ref 0–35)
AST: 22 U/L (ref 0–37)
Albumin: 4 g/dL (ref 3.5–5.2)
Alkaline Phosphatase: 92 U/L (ref 39–117)
BUN: 22 mg/dL (ref 6–23)
CO2: 26 mEq/L (ref 19–32)
Calcium: 9.5 mg/dL (ref 8.4–10.5)
Chloride: 104 mEq/L (ref 96–112)
Creatinine, Ser: 0.77 mg/dL (ref 0.40–1.20)
GFR: 75.92 mL/min (ref >60.00)
Glucose, Bld: 91 mg/dL (ref 70–99)
Potassium: 4.3 mEq/L (ref 3.5–5.1)
Sodium: 139 mEq/L (ref 135–145)
Total Bilirubin: 0.7 mg/dL (ref 0.2–1.2)
Total Protein: 6.9 g/dL (ref 6.0–8.3)

## 2021-06-30 LAB — HEMOGLOBIN A1C: Hgb A1c MFr Bld: 5.9 % (ref 4.6–6.5)

## 2021-06-30 MED ORDER — FLUTICASONE PROPIONATE 50 MCG/ACT NA SUSP: 2.0000 | Freq: Every day | NASAL | 3 refills | Status: DC

## 2021-06-30 NOTE — Addendum Note (Signed)
Addended by: Linton Ham on: 06/30/2021 02:54 PM   Modules accepted: Orders

## 2021-06-30 NOTE — Patient Instructions (Addendum)
  Thanks for doing lab: If you have mychart- we will send your results within 3 business days of [place] receiving them.  If you do not have mychart- we will call you about results within 5 business days of [place] receiving them.  *please also note that you will see labs on mychart as soon as they post. I will later go in and write notes on them- will say "notes from Dr. Yong Channel"  Team add Loratadine 10 mg to list and remove Xyzal from med list.    Take Tylenol/Acetaminophen for leg pain as needed. Also, can try heating pad or ice on area for 20 minutes for 2-3 times a day for at least 3-4 days. If new or worsening symptoms, please let us know. If not improving in 1-2 weeks we can refer to sports medicine or physical therapy  Start Flonase 2 sprays in each nostrils on a daily basis. Update me within 3-4 weeks on how its going.   Great job on [employer] and losing 13 lbs!!  Keep march visit otherwise if everything else improves

## 2021-07-12 DIAGNOSIS — I4891 Unspecified atrial fibrillation: Secondary | ICD-10-CM | POA: Diagnosis not present

## 2021-07-12 DIAGNOSIS — J439 Emphysema, unspecified: Secondary | ICD-10-CM | POA: Diagnosis not present

## 2021-07-12 DIAGNOSIS — I10 Essential (primary) hypertension: Secondary | ICD-10-CM | POA: Diagnosis not present

## 2021-07-12 DIAGNOSIS — E785 Hyperlipidemia, unspecified: Secondary | ICD-10-CM | POA: Diagnosis not present

## 2021-07-12 DIAGNOSIS — H3530 Unspecified macular degeneration: Secondary | ICD-10-CM | POA: Diagnosis not present

## 2021-07-12 DIAGNOSIS — D6869 Other thrombophilia: Secondary | ICD-10-CM | POA: Diagnosis not present

## 2021-07-12 DIAGNOSIS — J309 Allergic rhinitis, unspecified: Secondary | ICD-10-CM | POA: Diagnosis not present

## 2021-07-12 DIAGNOSIS — Z6841 Body Mass Index (BMI) 40.0 and over, adult: Secondary | ICD-10-CM | POA: Diagnosis not present

## 2021-07-14 ENCOUNTER — Other Ambulatory Visit: Payer: Self-pay | Admitting: Family Medicine

## 2021-08-03 ENCOUNTER — Other Ambulatory Visit: Payer: Self-pay | Admitting: Family

## 2021-08-03 DIAGNOSIS — Z7901 Long term (current) use of anticoagulants: Secondary | ICD-10-CM

## 2021-08-03 DIAGNOSIS — I480 Paroxysmal atrial fibrillation: Secondary | ICD-10-CM

## 2021-08-03 NOTE — Telephone Encounter (Signed)
Please review Eliquis for refill. Thank you! 

## 2021-08-03 NOTE — Telephone Encounter (Signed)
Prescription refill request for Eliquis received. Indication:afib Last office visit:walker 07/22/20 Scr:0.77  06/30/21 Age: 75 Weight:112.9kg

## 2021-08-03 NOTE — Telephone Encounter (Signed)
Rx request sent to pharmacy.  

## 2021-08-09 ENCOUNTER — Other Ambulatory Visit: Payer: Self-pay | Admitting: Family

## 2021-08-09 DIAGNOSIS — E782 Mixed hyperlipidemia: Secondary | ICD-10-CM

## 2021-08-20 ENCOUNTER — Other Ambulatory Visit: Payer: Self-pay | Admitting: Family Medicine

## 2021-09-06 ENCOUNTER — Other Ambulatory Visit: Payer: Self-pay | Admitting: Cardiovascular Disease

## 2021-09-06 ENCOUNTER — Telehealth: Payer: Self-pay | Admitting: Cardiovascular Disease

## 2021-09-06 DIAGNOSIS — I480 Paroxysmal atrial fibrillation: Secondary | ICD-10-CM

## 2021-09-06 MED ORDER — METOPROLOL TARTRATE 25 MG PO TABS: 25.0000 mg | ORAL_TABLET | Freq: Two times a day (BID) | ORAL | 0 refills | Status: DC

## 2021-09-06 NOTE — Telephone Encounter (Signed)
Requested Prescriptions   Signed Prescriptions Disp Refills   metoprolol tartrate (LOPRESSOR) 25 MG tablet 180 tablet 0    Sig: Take 1 tablet (25 mg total) by mouth 2 (two) times daily.    Authorizing Provider: Kathlyn Sacramento A    Ordering User: Britt Bottom

## 2021-09-06 NOTE — Telephone Encounter (Signed)
*  STAT* If patient is at the pharmacy, call can be transferred to refill team.   1. Which medications need to be refilled? (please list name of each medication and dose if known) Lopressor 25 mg 60 tablets NEEDS 180 tablets  2. Which pharmacy/location (including street and city if local pharmacy) is medication to be sent to? [place]  3. Do they need a 30 day or 90 day supply? 90 day

## 2021-09-06 NOTE — Telephone Encounter (Signed)
Please contact pt for future appointment. Pt due for 12 month f/u. 

## 2021-09-15 ENCOUNTER — Other Ambulatory Visit: Payer: Self-pay

## 2021-09-15 ENCOUNTER — Encounter: Payer: Self-pay | Admitting: Cardiovascular Disease

## 2021-09-15 ENCOUNTER — Ambulatory Visit: Payer: Medicare PPO | Admitting: Cardiovascular Disease

## 2021-09-15 VITALS — BP 108/72 | HR 70 | Ht 64.0 in | Wt 249.0 lb

## 2021-09-15 DIAGNOSIS — I480 Paroxysmal atrial fibrillation: Secondary | ICD-10-CM | POA: Diagnosis not present

## 2021-09-15 DIAGNOSIS — E785 Hyperlipidemia, unspecified: Secondary | ICD-10-CM

## 2021-09-15 NOTE — Progress Notes (Signed)
Cardiology Office Note   Date:  09/18/2021   ID:  Daiana, Vitiello [birth year], MRN [id number]  PCP:  Marin Olp, MD  Cardiologist:   Kathlyn Sacramento, MD   No chief complaint on file.     History of Present Illness: [name] is a 75 y.o. female who presents for a follow-up visit regarding paroxysmal atrial fibrillation.  She has known history of COPD from second hand smoking, and hyperlipidemia. She is a lifelong nonsmoker. She has no history of hypertension or diabetes.  She had a nuclear stress test in 2016 for atypical chest pain and back normal. The patient is on metoprolol and Eliquis.  She has been doing very well with no recent chest pain, shortness of breath or palpitations.  No dizziness or presyncope.   Past Medical History:  Diagnosis Date   Allergy    ASCUS on Pap smear 2007   ASTHMA UNSPECIFIED WITH EXACERBATION 11/08/2008   Atrial fibrillation (HCC)    Cancer of skin of left ear    Chest pain 03/2011   negative stress test - Dr. Ron Parker   History of frequent urinary tract infections    History of kidney stones    HYPERLIPIDEMIA, BORDERLINE 02/09/2009   Kidney stones    OSTEOARTHRITIS 04/24/2007   Osteopenia    Osteoporosis    Pneumonia    PONV (postoperative nausea and vomiting)    SHINGLES 11/08/2008   had vaccine at drugstore 12/14   VARICOSE VEINS LOWER EXTREMITIES W/INFLAMMATION 02/05/2008    Past Surgical History:  Procedure Laterality Date   Bone spur shoulder     x2 removed   COLPOSCOPY     Neg HR HPV   KNEE ARTHROSCOPY     laser surgery per left ey     secondary to film covering    lens implants- both eyes     SPINE SURGERY  2008   lumbar   TOTAL KNEE ARTHROPLASTY Right 08/07/2016   Procedure: RIGHT TOTAL KNEE ARTHROPLASTY;  Surgeon: Paralee Cancel, MD;  Location: [facility];  Service: Orthopedics;  Laterality: Right;     Current Outpatient Medications  Medication Sig Dispense Refill   Calcium Citrate 250 MG TABS Take 1,000 mg by  mouth 2 (two) times daily.     Cholecalciferol (VITAMIN D3) 2000 UNITS TABS Take 2,000 Units by mouth 2 (two) times daily.     ELIQUIS 5 MG TABS tablet TAKE 1 TABLET BY MOUTH TWICE DAILY . APPOINTMENT REQUIRED FOR FUTURE REFILLS 60 tablet 0   fish oil-omega-3 fatty acids 1000 MG capsule Take 2 capsules (2 g total) by mouth 2 (two) times daily. total for 4,000 mg     FLOVENT HFA 110 MCG/ACT inhaler INHALE 2 PUFFS INTO LUNGS TWICE DAILY 12 g 0   fluticasone (FLONASE) 50 MCG/ACT nasal spray Place 2 sprays into both nostrils daily. 16 g 3   GLUCOSAMINE SULFATE PO Take 2 tablets by mouth 2 (two) times daily.      LORATADINE PO Take 10 mg by mouth daily.     metoprolol tartrate (LOPRESSOR) 25 MG tablet Take 1 tablet (25 mg total) by mouth 2 (two) times daily. 180 tablet 0   Multiple Vitamin (MULTIVITAMIN) tablet Take 1 tablet by mouth daily.     PROAIR HFA 108 (90 Base) MCG/ACT inhaler INHALE TWO PUFFS INTO LUNGS EVERY 6 HOURS AS NEEDED FOR WHEEZING FOR SHORTNESS OF BREATH 9 each 5   rosuvastatin (CRESTOR) 10 MG tablet Take 1 tablet (  10 mg total) by mouth daily. PATIENT NEED TO SCHEDULE ANNUAL OFFICE VISIT FOR FUTURE REFILLS. 90 tablet 0   No current facility-administered medications for this visit.    Allergies:   Atorvastatin, Cefuroxime axetil, and Other    Social History:  The patient  reports that she has never smoked. She has been exposed to tobacco smoke. She has never used smokeless tobacco. She reports current alcohol use of about 1.0 standard drink per week. She reports that she does not use drugs.   Family History:  The patient's family history includes Breast cancer in her cousin; Cancer in her maternal grandmother; Dementia in her mother; Diabetes in her paternal aunt; Down syndrome in her sister; Heart disease in her father, maternal grandfather, and paternal grandfather; Hypertension in her father; Melanoma in her mother; Ovarian cancer in her mother; Prostate cancer in her maternal  uncle, maternal uncle, and maternal uncle; Sudden Cardiac Death in her son.    ROS:  Please see the history of present illness.   Otherwise, review of systems are positive for none.   All other systems are reviewed and negative.    PHYSICAL EXAM: VS:  BP 108/72   Pulse 70   Ht 5\' 4"  (1.626 m)   Wt 249 lb (112.9 kg)   SpO2 98%   BMI 42.74 kg/m  , BMI Body mass index is 42.74 kg/m. GEN: Well nourished, well developed, in no acute distress  HEENT: normal  Neck: no JVD, carotid bruits, or masses Cardiac: RRR; no murmurs, rubs, or gallops,no edema  Respiratory:  clear to auscultation bilaterally, normal work of breathing GI: soft, nontender, nondistended, + BS MS: no deformity or atrophy  Skin: warm and dry, no rash Neuro:  Strength and sensation are intact Psych: euthymic mood, full affect   EKG:  EKG is ordered today. The ekg ordered today demonstrates normal sinus rhythm with low voltage.   Recent Labs: 12/23/2020: TSH 2.77 06/30/2021: ALT 21; BUN 22; Creatinine, Ser 0.77; Hemoglobin 12.7; Platelets 156.0; Potassium 4.3; Sodium 139    Lipid Panel    Component Value Date/Time   CHOL 143 12/23/2020 0916   CHOL 221 (H) 06/17/2015 0856   TRIG 75.0 12/23/2020 0916   HDL 56.00 12/23/2020 0916   HDL 67 06/17/2015 0856   CHOLHDL 3 12/23/2020 0916   VLDL 15.0 12/23/2020 0916   LDLCALC 72 12/23/2020 0916   LDLCALC 142 (H) 06/17/2015 0856   LDLDIRECT 149.5 10/24/2012 0831      Wt Readings from Last 3 Encounters:  09/15/21 249 lb (112.9 kg)  06/30/21 249 lb (112.9 kg)  01/31/21 262 lb (118.8 kg)       No flowsheet data found.    ASSESSMENT AND PLAN:  1.   Paroxysmal atrial fibrillation: She is doing well on metoprolol with no recurrent symptoms.   She is tolerating anticoagulation with Eliquis with no bleeding complications.   I reviewed most recent labs done in September which showed normal renal function and normal CBC.  2. Hyperlipidemia: I reviewed most recent  lipid profile done in March 2022 which showed an LDL of 72.  This is an acceptable target given no known cardiovascular events.  Continue current dose of rosuvastatin.   Disposition:   FU with me in 12 months.  Signed,  Kathlyn Sacramento, MD  09/18/2021 7:41 AM    [place]

## 2021-09-15 NOTE — Patient Instructions (Signed)
Medication Instructions:  Your physician recommends that you continue on your current medications as directed. Please refer to the Current Medication list given to you today.  *If you need a refill on your cardiac medications before your next appointment, please call your pharmacy*   Lab Work: None ordered If you have labs (blood work) drawn today and your tests are completely normal, you will receive your results only by: [place] (if you have MyChart) OR A paper copy in the mail If you have any lab test that is abnormal or we need to change your treatment, we will call you to review the results.   Testing/Procedures: None ordered   Follow-Up: At [facility], you and your health needs are our priority.  As part of our continuing mission to provide you with exceptional heart care, we have created designated Provider Care Teams.  These Care Teams include your primary Cardiologist (physician) and Advanced Practice Providers (APPs -  Physician Assistants and Nurse Practitioners) who all work together to provide you with the care you need, when you need it.  We recommend signing up for the patient portal called "MyChart".  Sign up information is provided on this After Visit Summary.  MyChart is used to connect with patients for Virtual Visits (Telemedicine).  Patients are able to view lab/test results, encounter notes, upcoming appointments, etc.  Non-urgent messages can be sent to your provider as well.   To learn more about what you can do with MyChart, go to [email or web address].    Your next appointment:   Your physician wants you to follow-up in: 1 year You will receive a reminder letter in the mail two months in advance. If you don't receive a letter, please call our office to schedule the follow-up appointment.   The format for your next appointment:   In Person  Provider:   You may see Kathlyn Sacramento, MD or one of the following Advanced Practice Providers on your  designated Care Team:   Murray Hodgkins, NP Christell Faith, PA-C Cadence Kathlen Mody, PA-C :1}    Other Instructions N/A

## 2021-09-25 ENCOUNTER — Other Ambulatory Visit: Payer: Self-pay | Admitting: Family Medicine

## 2021-09-27 NOTE — Progress Notes (Signed)
Phone [phone] In person visit   Subjective:   [name] is a 75 y.o. year old very pleasant female patient who presents for/with See problem oriented charting Chief Complaint  Patient presents with   Sinusitis    Pt stated that she has been experiencing some throat drainage for the past yr. Coughing up a light green mucous. Also c/o Lt shoulder pain for the past 6wks.    This visit occurred during the SARS-CoV-2 public health emergency.  Safety protocols were in place, including screening questions prior to the visit, additional usage of staff PPE, and extensive cleaning of exam room while observing appropriate contact time as indicated for disinfecting solutions.   Past Medical History-  Patient Active Problem List   Diagnosis Date Noted   Paroxysmal atrial fibrillation ([place]) 12/11/2017    Priority: High   Macular degeneration 12/22/2019    Priority: Medium    Atypical chest pain 12/27/2014    Priority: Medium    Hyperlipidemia 02/09/2009    Priority: Medium    COPD (chronic obstructive pulmonary disease) ([place]) 02/05/2008    Priority: Medium    S/P right TKA 08/07/2016    Priority: Low   Dysphagia 11/14/2015    Priority: Low   Class 3 severe obesity due to excess calories without serious comorbidity with body mass index (BMI) of 40.0 to 44.9 in adult [facility]) 06/17/2014    Priority: Low   Kidney stones 06/17/2014    Priority: Low   Osteopenia     Priority: Low   Allergic rhinitis 05/07/2008    Priority: Low   VARICOSE VEINS LOWER EXTREMITIES W/INFLAMMATION 02/05/2008    Priority: Low   Osteoarthritis 04/24/2007    Priority: Low    Medications- reviewed and updated Current Outpatient Medications  Medication Sig Dispense Refill   amoxicillin-clavulanate (AUGMENTIN) 875-125 MG tablet Take 1 tablet by mouth 2 (two) times daily. 20 tablet 0   apixaban (ELIQUIS) 5 MG TABS tablet TAKE 1 TABLET BY MOUTH TWICE DAILY . APPOINTMENT REQUIRED FOR FUTURE REFILLS 60 tablet  5   Calcium Citrate 250 MG TABS Take 1,000 mg by mouth 2 (two) times daily.     Cholecalciferol (VITAMIN D3) 2000 UNITS TABS Take 2,000 Units by mouth 2 (two) times daily.     fish oil-omega-3 fatty acids 1000 MG capsule Take 2 capsules (2 g total) by mouth 2 (two) times daily. total for 4,000 mg     FLOVENT HFA 110 MCG/ACT inhaler INHALE 2 PUFFS INTO LUNGS TWICE DAILY 12 g 0   fluticasone (FLONASE) 50 MCG/ACT nasal spray Place 2 sprays into both nostrils daily. 16 g 3   GLUCOSAMINE SULFATE PO Take 2 tablets by mouth 2 (two) times daily.      LORATADINE PO Take 10 mg by mouth daily.     metoprolol tartrate (LOPRESSOR) 25 MG tablet Take 1 tablet (25 mg total) by mouth 2 (two) times daily. 180 tablet 0   Multiple Vitamin (MULTIVITAMIN) tablet Take 1 tablet by mouth daily.     predniSONE (DELTASONE) 20 MG tablet Take 2 pills for 3 days, 1 pill for 4 days 10 tablet 0   PROAIR HFA 108 (90 Base) MCG/ACT inhaler INHALE TWO PUFFS INTO LUNGS EVERY 6 HOURS AS NEEDED FOR WHEEZING FOR SHORTNESS OF BREATH 9 each 5   rosuvastatin (CRESTOR) 10 MG tablet Take 1 tablet (10 mg total) by mouth daily. PATIENT NEED TO SCHEDULE ANNUAL OFFICE VISIT FOR FUTURE REFILLS. 90 tablet 0   No  current facility-administered medications for this visit.     Objective:  BP 138/84    Pulse 75    Temp (!) 96.7 F (35.9 C)    Ht 5\' 4"  (1.626 m)    Wt 247 lb 6.4 oz (112.2 kg)    SpO2 96%    BMI 42.47 kg/m  Gen: NAD, resting comfortably Patient with tender frontal sinuses-maxillary sinuses nontender.  Nasal turbinates appear edematous with some clear discharge.  Pharynx with some cobblestoning/signs of chronic drainage. CV: RRR no murmurs rubs or gallops Lungs: CTAB no crackles, wheeze, rhonchi Ext: no edema Skin: warm, dry Neuro: Good strength in upper extremities except for when limited by pain with rotator cuff  Left Shoulder: Inspection reveals no abnormalities, atrophy or asymmetry. ROM limited with abduction and flexion  to about 135 degrees Rotator cuff strength normal throughout. Positive signs of impingement with positive Neer and Hawkin's tests, empty can. Painful arc but no drop arm sign.     Assessment and Plan   # Sinus congestion with concern for sinusitis by patient In patient with history of COPD/allergies S: Patient reports sinus drainage and sinus and chest congestion for well over a year but worse since November. Back in October took amoxicillin for dental appt and had temporary relief- no drainage and felt much drier. Felt good for 4 days after and then in November symptoms worsened again- about 6 weeks of worsening drainage and sinus pressure. Feels congested but unable to blow junk out of nose- when she coughs it up whitish or cream color with specks of green at times.  - doing loratadine at baseline for allergies. Still using flovent for COPD A/P: 75 year old female with history of COPD with recurrent likely sinus drainage down the back of her throat who had relief when she was treated with amoxicillin for dental prophylaxis raising concern for bacterial superinfection over baseline sinusitis. -We are going to trial a 10-day course of Augmentin - I also would like for her to add Flonase back into her regimen for her baseline allergies after about a week of Augmentin.  Continue loratadine.  I still think baseline allergies are certainly playing a role here. - COPD does not appear to be flared up at present-continue Flovent as overall appears stable/well-controlled-if she has more shortness of breath or wheezing I asked her to let us know - We discussed follow-up if symptoms worsen in after antibiotics or any failure to improve-could consider ENT referral or pulm referral  #Left shoulder pain S: Patient is noted left shoulder pain for the last 6 weeks. She is very active with wood splitting- thought maybe pulled something but not getting better. If tries to get arm over her head unable to do so.  Tends to sleep on left side and feels worse when wakes up- trying to sleep on right but not improving  History bone spurs in right shoulder- usually the left does better until recently A/P: 75 year old female with 6 weeks of left shoulder pain.  On exam-concern for rotator cuff tendinitis or bursitis. - Recommended home physical therapy exercises 3 times a week for a month and then once a week for the next month or 2.  We also offered formal physical therapy which she declined for now.  We discussed if pain during exercise increase more than 1 or 2 out of 10 to stop the exercise. - Also after 7 days of antibiotics I want her to try a course of prednisone to decrease inflammation in the shoulder.  We can refer her to sports medicine or orthopedics per her preference if she fails to improve   # Atrial fibrillation- follows with Dr. Tyrell Antonio: Rate controlled with metoprolol 25 mg twice daily Anticoagulated with Eliquis 5 mg twice daily A/P: Appropriately anticoagulated and rate controlled-continue current medication   #hyperlipidemia S: medication: Rosuvastatin 10 mg, fish oil 1000 mg daily Lab Results  Component Value Date   CHOL 143 12/23/2020   HDL 56.00 12/23/2020   LDLCALC 72 12/23/2020   LDLDIRECT 149.5 10/24/2012   TRIG 75.0 12/23/2020   CHOLHDL 3 12/23/2020   A/P: plan has been to focus on lifestyle to get LDL under 70 as so close to goal plus increasing statin strength may increase diabetes risk. For now continue current meds with very mild poor control   # Hyperglycemia/insulin resistance/prediabetes S:  Medication: none Lab Results  Component Value Date   HGBA1C 5.9 06/30/2021   HGBA1C 5.9 12/23/2020   HGBA1C 5.8 12/22/2019   A/P: has been stable lately- too soon for repeat- work on lifestyle  # HM covid bivalent booster recommended at pharmacy  Recommended follow up: No follow-ups on file. Future Appointments  Date Time Provider [place]  12/27/2021  9:20 AM  Marin Olp, MD [facility]  01/04/2022  9:15 AM Hayden Pedro, MD [facility] None  03/29/2022 10:15 AM [facility] HEALTH COACH [facility]    Lab/Order associations:   ICD-10-CM   1. Bacterial sinusitis  J32.9    B96.89     2. Chronic obstructive pulmonary disease, unspecified COPD type ([place])  J44.9     3. Paroxysmal atrial fibrillation (H[facility])  I48.0     4. Hyperlipidemia, unspecified hyperlipidemia type  E78.5     5. Hyperglycemia  R73.9     6. Allergic rhinitis, unspecified seasonality, unspecified trigger  J30.9      Meds ordered this encounter  Medications   amoxicillin-clavulanate (AUGMENTIN) 875-125 MG tablet    Sig: Take 1 tablet by mouth 2 (two) times daily.    Dispense:  20 tablet    Refill:  0   predniSONE (DELTASONE) 20 MG tablet    Sig: Take 2 pills for 3 days, 1 pill for 4 days    Dispense:  10 tablet    Refill:  0   I,Jada Bradford,acting as a scribe for Garret Reddish, MD.,have documented all relevant documentation on the behalf of Garret Reddish, MD,as directed by  Garret Reddish, MD while in the presence of Garret Reddish, MD.  I, Garret Reddish, MD, have reviewed all documentation for this visit. The documentation on 10/04/21 for the exam, diagnosis, procedures, and orders are all accurate and complete.  Return precautions advised.  Garret Reddish, MD

## 2021-09-30 ENCOUNTER — Other Ambulatory Visit: Payer: Self-pay | Admitting: Family

## 2021-09-30 DIAGNOSIS — I480 Paroxysmal atrial fibrillation: Secondary | ICD-10-CM

## 2021-09-30 DIAGNOSIS — Z7901 Long term (current) use of anticoagulants: Secondary | ICD-10-CM

## 2021-10-02 NOTE — Telephone Encounter (Signed)
Prescription refill request for Eliquis received. Indication:Afib Last office visit:12/22 Scr:0.7 Age: 75 Weight:112.9 kg  Prescription refilled

## 2021-10-04 ENCOUNTER — Ambulatory Visit: Payer: Medicare PPO | Admitting: Family Medicine

## 2021-10-04 ENCOUNTER — Encounter: Payer: Self-pay | Admitting: Family Medicine

## 2021-10-04 ENCOUNTER — Other Ambulatory Visit: Payer: Self-pay

## 2021-10-04 ENCOUNTER — Other Ambulatory Visit (HOSPITAL_BASED_OUTPATIENT_CLINIC_OR_DEPARTMENT_OTHER): Payer: Self-pay

## 2021-10-04 ENCOUNTER — Ambulatory Visit: Payer: Medicare PPO | Attending: Internal Medicine

## 2021-10-04 VITALS — BP 138/84 | HR 75 | Temp 96.7°F | Ht 64.0 in | Wt 247.4 lb

## 2021-10-04 DIAGNOSIS — I480 Paroxysmal atrial fibrillation: Secondary | ICD-10-CM

## 2021-10-04 DIAGNOSIS — B9689 Other specified bacterial agents as the cause of diseases classified elsewhere: Secondary | ICD-10-CM | POA: Diagnosis not present

## 2021-10-04 DIAGNOSIS — J329 Chronic sinusitis, unspecified: Secondary | ICD-10-CM | POA: Diagnosis not present

## 2021-10-04 DIAGNOSIS — E785 Hyperlipidemia, unspecified: Secondary | ICD-10-CM

## 2021-10-04 DIAGNOSIS — J309 Allergic rhinitis, unspecified: Secondary | ICD-10-CM

## 2021-10-04 DIAGNOSIS — R739 Hyperglycemia, unspecified: Secondary | ICD-10-CM | POA: Diagnosis not present

## 2021-10-04 DIAGNOSIS — J449 Chronic obstructive pulmonary disease, unspecified: Secondary | ICD-10-CM | POA: Diagnosis not present

## 2021-10-04 DIAGNOSIS — Z23 Encounter for immunization: Secondary | ICD-10-CM

## 2021-10-04 MED ORDER — PREDNISONE 20 MG PO TABS: ORAL_TABLET | ORAL | 0 refills | Status: DC

## 2021-10-04 MED ORDER — AMOXICILLIN-POT CLAVULANATE 875-125 MG PO TABS: 1.0000 | ORAL_TABLET | Freq: Two times a day (BID) | ORAL | 0 refills | Status: DC

## 2021-10-04 NOTE — Patient Instructions (Addendum)
Health Maintenance Due  Topic Date Due   Zoster Vaccines- Shingrix (1 of 2) -   Team please log in #1 shingle shot.   - Please let know [place] know when you receive your final shingle shot.   Never done   COVID-19 Vaccine (5 - Booster for [employer] series)-  Please consider scheduling an appointment to get your bivalent booster shot at your local pharmacy. When received, please let us know.  06/21/2021   Team please give out rotator cuff  handout.   Please trail Augmentin twice a day with meals for 10 days and add Flonase back in your regimen after a week of Augmentin for your sinus infection.  Please update for any new, worsening or persistent symptoms.   Please trial Prednisone for inflammation on rotator cuff. Please update for any new, worsening or persistent symptoms.   Please do physical therapy exercises at home for 3 times a week for a month and once a week for the next month or two. If pain exercising increase more than 1/10, please stop the exercises.  Recommended follow up: needed follow-up if you are having new, worsening or persistent symptoms. If failed to improve with sinuses will do Ear,Nose or Throat referral or Pulmonary referral.   I hope you feel better and Happy Holidays!

## 2021-10-04 NOTE — Progress Notes (Signed)
° °  Covid-19 Vaccination Clinic  Name:  EMOREE SASAKI    MRN: [id number] DOB: August 09, 1946  10/04/2021  Ms. Pherigo was observed post Covid-19 immunization for 15 minutes without incident. She was provided with Vaccine Information Sheet and instruction to access the V-Safe system.   Ms. Fusaro was instructed to call 911 with any severe reactions post vaccine: Difficulty breathing  Swelling of face and throat  A fast heartbeat  A bad rash all over body  Dizziness and weakness   Immunizations Administered     Name Date Dose VIS Date Route   Pfizer Covid-19 Vaccine Bivalent Booster 10/04/2021  8:59 AM 0.3 mL 06/14/2021 Intramuscular   Manufacturer: [place]   Lot: [id number]   [place]: [phone]

## 2021-10-26 ENCOUNTER — Other Ambulatory Visit: Payer: Self-pay | Admitting: Cardiovascular Disease

## 2021-10-26 DIAGNOSIS — E782 Mixed hyperlipidemia: Secondary | ICD-10-CM

## 2021-10-31 ENCOUNTER — Ambulatory Visit: Payer: Medicare PPO | Admitting: Cardiovascular Disease

## 2021-10-31 ENCOUNTER — Ambulatory Visit: Payer: Medicare PPO | Admitting: Family Medicine

## 2021-11-04 ENCOUNTER — Other Ambulatory Visit: Payer: Self-pay | Admitting: Family Medicine

## 2021-11-16 ENCOUNTER — Telehealth: Payer: Self-pay | Admitting: Cardiovascular Disease

## 2021-11-16 ENCOUNTER — Other Ambulatory Visit: Payer: Self-pay | Admitting: Cardiovascular Disease

## 2021-11-16 DIAGNOSIS — I480 Paroxysmal atrial fibrillation: Secondary | ICD-10-CM

## 2021-11-16 DIAGNOSIS — E782 Mixed hyperlipidemia: Secondary | ICD-10-CM

## 2021-11-16 MED ORDER — ROSUVASTATIN CALCIUM 10 MG PO TABS: 10.0000 mg | ORAL_TABLET | Freq: Every day | ORAL | 3 refills | Status: DC

## 2021-11-16 NOTE — Telephone Encounter (Signed)
°*  STAT* If patient is at the pharmacy, call can be transferred to refill team.   1. Which medications need to be refilled? (please list name of each medication and dose if known) Crestor 10mg  1 tablet  daily  2. Which pharmacy/location (including street and city if local pharmacy) is medication to be sent to? [place], [place]  3. Do they need a 30 day or 90 day supply? 90 day

## 2021-11-16 NOTE — Telephone Encounter (Signed)
Requested Prescriptions   Signed Prescriptions Disp Refills   rosuvastatin (CRESTOR) 10 MG tablet 90 tablet 3    Sig: Take 1 tablet (10 mg total) by mouth daily.    Authorizing Provider: Kathlyn Sacramento A    Ordering User: Britt Bottom

## 2021-12-04 ENCOUNTER — Other Ambulatory Visit: Payer: Self-pay | Admitting: Family Medicine

## 2021-12-15 NOTE — Progress Notes (Incomplete)
Phone [phone]   Subjective:  Patient presents today for their annual physical. Chief complaint-noted.   See problem oriented charting- ROS- full  review of systems was completed and negative except for: ***  The following were reviewed and entered/updated in epic: Past Medical History:  Diagnosis Date   Allergy    ASCUS on Pap smear 2007   ASTHMA UNSPECIFIED WITH EXACERBATION 11/08/2008   Atrial fibrillation ([place])    Cancer of skin of left ear    Chest pain 03/2011   negative stress test - Dr. Ron Parker   History of frequent urinary tract infections    History of kidney stones    HYPERLIPIDEMIA, BORDERLINE 02/09/2009   Kidney stones    OSTEOARTHRITIS 04/24/2007   Osteopenia    Osteoporosis    Pneumonia    PONV (postoperative nausea and vomiting)    SHINGLES 11/08/2008   had vaccine at drugstore 12/14   VARICOSE VEINS LOWER EXTREMITIES W/INFLAMMATION 02/05/2008   Patient Active Problem List   Diagnosis Date Noted   Macular degeneration 12/22/2019   Paroxysmal atrial fibrillation ([place]) 12/11/2017   S/P right TKA 08/07/2016   Dysphagia 11/14/2015   Atypical chest pain 12/27/2014   Class 3 severe obesity due to excess calories without serious comorbidity with body mass index (BMI) of 40.0 to 44.9 in adult ([place]) 06/17/2014   Kidney stones 06/17/2014   Osteopenia    Hyperlipidemia 02/09/2009   Allergic rhinitis 05/07/2008   VARICOSE VEINS LOWER EXTREMITIES W/INFLAMMATION 02/05/2008   COPD (chronic obstructive pulmonary disease) ([place]) 02/05/2008   Osteoarthritis 04/24/2007   Past Surgical History:  Procedure Laterality Date   Bone spur shoulder     x2 removed   COLPOSCOPY     Neg HR HPV   KNEE ARTHROSCOPY     laser surgery per left ey     secondary to film covering    lens implants- both eyes     SPINE SURGERY  2008   lumbar   TOTAL KNEE ARTHROPLASTY Right 08/07/2016   Procedure: RIGHT TOTAL KNEE ARTHROPLASTY;  Surgeon: Paralee Cancel, MD;  Location: [facility];   Service: Orthopedics;  Laterality: Right;    Family History  Problem Relation Age of Onset   Ovarian cancer Mother    Melanoma Mother    Dementia Mother    Hypertension Father    Heart disease Father        MI 16   Heart disease Paternal Grandfather    Cancer Maternal Grandmother        Oral cancer   Down syndrome Sister    Prostate cancer Maternal Uncle    Heart disease Maternal Grandfather    Prostate cancer Maternal Uncle    Prostate cancer Maternal Uncle    Breast cancer Cousin        two maternal cousins diagnosed <50   Diabetes Paternal Aunt    Sudden Cardiac Death Son        unclear cause.    Colon cancer Neg Hx    Esophageal cancer Neg Hx    Rectal cancer Neg Hx    Stomach cancer Neg Hx     Medications- reviewed and updated Current Outpatient Medications  Medication Sig Dispense Refill   amoxicillin-clavulanate (AUGMENTIN) 875-125 MG tablet Take 1 tablet by mouth 2 (two) times daily. 20 tablet 0   apixaban (ELIQUIS) 5 MG TABS tablet TAKE 1 TABLET BY MOUTH TWICE DAILY . APPOINTMENT REQUIRED FOR FUTURE REFILLS 60 tablet 5   Calcium Citrate 250 MG  TABS Take 1,000 mg by mouth 2 (two) times daily.     Cholecalciferol (VITAMIN D3) 2000 UNITS TABS Take 2,000 Units by mouth 2 (two) times daily.     fish oil-omega-3 fatty acids 1000 MG capsule Take 2 capsules (2 g total) by mouth 2 (two) times daily. total for 4,000 mg     FLOVENT HFA 110 MCG/ACT inhaler INHALE 2 PUFFS INTO LUNGS TWICE DAILY 12 g 0   fluticasone (FLONASE) 50 MCG/ACT nasal spray Place 2 sprays into both nostrils daily. 16 g 3   GLUCOSAMINE SULFATE PO Take 2 tablets by mouth 2 (two) times daily.      LORATADINE PO Take 10 mg by mouth daily.     metoprolol tartrate (LOPRESSOR) 25 MG tablet Take 1 tablet by mouth twice daily 180 tablet 2   Multiple Vitamin (MULTIVITAMIN) tablet Take 1 tablet by mouth daily.     predniSONE (DELTASONE) 20 MG tablet Take 2 pills for 3 days, 1 pill for 4 days 10 tablet 0   PROAIR  HFA 108 (90 Base) MCG/ACT inhaler INHALE TWO PUFFS INTO LUNGS EVERY 6 HOURS AS NEEDED FOR WHEEZING FOR SHORTNESS OF BREATH 9 each 5   rosuvastatin (CRESTOR) 10 MG tablet Take 1 tablet (10 mg total) by mouth daily. 90 tablet 3   No current facility-administered medications for this visit.    Allergies-reviewed and updated Allergies  Allergen Reactions   Atorvastatin     Myalgia   Cefuroxime Axetil Diarrhea and Nausea And Vomiting   Other     Social History   Social History Narrative   Married 72 years in 2015. 2 kids. 2 grandkids-14 and 12 in 2020.       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 Gen: NAD, resting comfortably HEENT: Mucous membranes are moist. Oropharynx normal Neck: no thyromegaly CV: RRR no murmurs rubs or gallops Lungs: CTAB no crackles, wheeze, rhonchi Abdomen: soft/nontender/nondistended/normal bowel sounds. No rebound or guarding.  Ext: no edema Skin: warm, dry Neuro: grossly normal, moves all extremities, PERRLA***   Assessment and Plan   76 y.o. female presenting for annual physical.  Health Maintenance counseling: 1. Anticipatory guidance: Patient counseled regarding regular dental exams ***q6 months, eye exams -close follow-up due to macular degeneration,***,  avoiding smoking and second hand smoke*** , limiting alcohol to 1 beverage per day- 1 per month .  *** .   2. Risk factor reduction:  Advised patient of need for regular exercise and diet rich and fruits and vegetables to reduce risk of heart attack and stroke.  Exercise- Misses the pool- can rent lanes but due to gas prices harder for her to get out for this. ***.  Diet/Diet management-some stress eating with pandemic and weight up.  Morbid obesity noted with BMI over  40-discussed healthy eating/regular exercise as above. Discussed cutting plate in half and coming back after 20 minutes. .  Wt Readings from Last 3 Encounters:  10/04/21 247 lb 6.4 oz (112.2 kg)  09/15/21 249 lb (112.9 kg)  06/30/21 249 lb (112.9 kg)   3. Immunizations/screenings/ancillary studies DISCUSSED:  -Shingrix vaccine - *** Immunization History  Administered Date(s) Administered   Fluad Quad(high Dose 65+) 08/18/2020   Influenza Whole 08/15/2000, 08/06/2008  Influenza, High Dose Seasonal PF 10/25/2014, 07/16/2016, 07/16/2017, 08/13/2018   Influenza,inj,Quad PF,6+ Mos 08/01/2015, 07/30/2019   Influenza-Unspecified 08/15/2021   PFIZER Comirnaty(Gray Top)Covid-19 Tri-Sucrose Vaccine 04/26/2021   PFIZER(Purple Top)SARS-COV-2 Vaccination 11/19/2019, 12/10/2019, 10/26/2020   PPD Test 07/05/2014   Pfizer Covid-19 Vaccine Bivalent Booster 36yrs & up 10/04/2021   Pneumococcal Conjugate-13 10/31/2015   Pneumococcal Polysaccharide-23 08/15/2000, 06/08/2013   Td 09/14/1998, 02/05/2008   Tdap 06/16/2019   Zoster Recombinat (Shingrix) 07/10/2021   Zoster, Live 11/06/2013   Health Maintenance Due  Topic Date Due   Zoster Vaccines- Shingrix (2 of 2) 09/04/2021   4. Cervical cancer screening- sees gynecology still- mom had ovarian cancer *** 5. Breast cancer screening-  breast exam with GYN *** and mammogram *** 6. Colon cancer screening - 12/01/15 with 10 year repeat planned*** 7. Skin cancer screening-- sees dermatology at least yearly. ***advised regular sunscreen use. Denies worrisome, changing, or new skin lesions.  8. Birth control/STD check- postmenopausal/monogomous*** 9. Osteoporosis screening at 16- DEXA 12/26/20*** -never smoker - ***  Status of chronic or acute concerns   ***CPE 12/23/2020 ***awv 12/22/19  # Sinus congestion with concern for sinusitis by patient In patient with history of COPD/allergies S: Patient reported sinus drainage and sinus and chest congestion for  well over a year but worsened since November 2022. Back in October 2022 took amoxicillin for dental appt and had temporary relief- no drainage and felt much drier. Felt good for 4 days after and then in November symptoms worsened again- about 6 weeks of worsened drainage and sinus pressure. Felt congested but unable to blow junk out of nose- when she coughed it up whitish or cream color with specks of green at times.  - doing loratadine at baseline for allergies. Still using flovent for COPD --We are going to trial a 10-day course of Augmentin.  -Also considered ENT referral or pulm referral - I also would like for her to add Flonase back into her regimen for her baseline allergies after about a week of Augmentin.  Continue loratadine.  A/P: ***   #Left shoulder pain S: Patient was noted left shoulder pain.  She was very active with wood splitting- thought maybe pulled something but not getting better. If tried to get arm over her head unable to do so. Tended to sleep on left side and felt worse when wakes up- tried to sleep on right but not improved   History bone spurs in right shoulder- usually the left does better until recently We discussed if pain during exercise increase more than 1 or 2 out of 10 to stop the exercise. - Also after 7 days of antibiotics I want her to try a course of prednisone to decrease inflammation in the shoulder.  We can refer her to sports medicine or orthopedics per her preference if she failed to improve A/P: ***   # Atrial fibrillation- follows with Dr. Tyrell Antonio: Rate controlled with metoprolol 25 mg twice daily Anticoagulated with Eliquis 5 mg twice daily A/P: ***   #hyperlipidemia S: medication: Rosuvastatin 10 mg, fish oil 1000 mg daily Lab Results  Component Value Date   CHOL 143 12/23/2020   HDL 56.00 12/23/2020   LDLCALC 72 12/23/2020   LDLDIRECT 149.5 10/24/2012   TRIG 75.0 12/23/2020   CHOLHDL 3 12/23/2020   A/P: ***  # Hyperglycemia/insulin  resistance/prediabetes S:  Medication: none Exercise and diet- *** Lab Results  Component Value Date   HGBA1C 5.9 06/30/2021   HGBA1C 5.9 12/23/2020  HGBA1C 5.8 12/22/2019    A/P: ***  Recommended follow up: No follow-ups on file. Future Appointments  Date Time Provider [place]  12/27/2021  9:20 AM Marin Olp, MD [facility]  01/04/2022  9:15 AM Hayden Pedro, MD [facility] None  03/29/2022 10:15 AM [facility] HEALTH COACH [facility]    No chief complaint on file.  Lab/Order associations:*** fasting No diagnosis found.  No orders of the defined types were placed in this encounter.  I,Jada Bradford,acting as a scribe for Garret Reddish, MD.,have documented all relevant documentation on the behalf of Garret Reddish, MD,as directed by  Garret Reddish, MD while in the presence of Garret Reddish, MD.  ***  Return precautions advised.  Burnett Corrente

## 2021-12-18 ENCOUNTER — Other Ambulatory Visit: Payer: Self-pay | Admitting: Dermatology

## 2021-12-18 DIAGNOSIS — L570 Actinic keratosis: Secondary | ICD-10-CM | POA: Diagnosis not present

## 2021-12-18 DIAGNOSIS — L578 Other skin changes due to chronic exposure to nonionizing radiation: Secondary | ICD-10-CM | POA: Diagnosis not present

## 2021-12-18 DIAGNOSIS — L821 Other seborrheic keratosis: Secondary | ICD-10-CM | POA: Diagnosis not present

## 2021-12-18 DIAGNOSIS — Z85828 Personal history of other malignant neoplasm of skin: Secondary | ICD-10-CM | POA: Diagnosis not present

## 2021-12-18 DIAGNOSIS — Z23 Encounter for immunization: Secondary | ICD-10-CM | POA: Diagnosis not present

## 2021-12-18 DIAGNOSIS — Z86018 Personal history of other benign neoplasm: Secondary | ICD-10-CM | POA: Diagnosis not present

## 2021-12-18 DIAGNOSIS — L814 Other melanin hyperpigmentation: Secondary | ICD-10-CM | POA: Diagnosis not present

## 2021-12-18 DIAGNOSIS — D225 Melanocytic nevi of trunk: Secondary | ICD-10-CM | POA: Diagnosis not present

## 2021-12-18 DIAGNOSIS — D485 Neoplasm of uncertain behavior of skin: Secondary | ICD-10-CM | POA: Diagnosis not present

## 2021-12-20 LAB — DERMPATH, SPECIMEN A

## 2021-12-20 LAB — DERMATOPATHOLOGY REPORT

## 2021-12-27 ENCOUNTER — Encounter: Payer: Self-pay | Admitting: Family Medicine

## 2021-12-27 ENCOUNTER — Ambulatory Visit (INDEPENDENT_AMBULATORY_CARE_PROVIDER_SITE_OTHER): Payer: Medicare PPO | Admitting: Family Medicine

## 2021-12-27 VITALS — BP 130/70 | HR 61 | Temp 97.1°F | Ht 64.0 in | Wt 253.0 lb

## 2021-12-27 DIAGNOSIS — J449 Chronic obstructive pulmonary disease, unspecified: Secondary | ICD-10-CM

## 2021-12-27 DIAGNOSIS — Z0000 Encounter for general adult medical examination without abnormal findings: Secondary | ICD-10-CM | POA: Diagnosis not present

## 2021-12-27 DIAGNOSIS — E785 Hyperlipidemia, unspecified: Secondary | ICD-10-CM | POA: Diagnosis not present

## 2021-12-27 DIAGNOSIS — I480 Paroxysmal atrial fibrillation: Secondary | ICD-10-CM

## 2021-12-27 DIAGNOSIS — R739 Hyperglycemia, unspecified: Secondary | ICD-10-CM

## 2021-12-27 LAB — CBC WITH DIFFERENTIAL/PLATELET
Basophils Absolute: 0 10*3/uL (ref 0.0–0.1)
Basophils Relative: 0.8 % (ref 0.0–3.0)
Eosinophils Absolute: 0.1 10*3/uL (ref 0.0–0.7)
Eosinophils Relative: 1.1 % (ref 0.0–5.0)
HCT: 38.7 % (ref 36.0–46.0)
Hemoglobin: 12.8 g/dL (ref 12.0–15.0)
Lymphocytes Relative: 14 % (ref 12.0–46.0)
Lymphs Abs: 0.9 10*3/uL (ref 0.7–4.0)
MCHC: 33 g/dL (ref 30.0–36.0)
MCV: 96.2 fl (ref 78.0–100.0)
Monocytes Absolute: 0.6 10*3/uL (ref 0.1–1.0)
Monocytes Relative: 9.3 % (ref 3.0–12.0)
Neutro Abs: 4.9 10*3/uL (ref 1.4–7.7)
Neutrophils Relative %: 74.8 % (ref 43.0–77.0)
Platelets: 144 10*3/uL — ABNORMAL LOW (ref 150.0–400.0)
RBC: 4.02 Mil/uL (ref 3.87–5.11)
RDW: 14.1 % (ref 11.5–15.5)
WBC: 6.5 10*3/uL (ref 4.0–10.5)

## 2021-12-27 LAB — COMPREHENSIVE METABOLIC PANEL
ALT: 23 U/L (ref 0–35)
AST: 23 U/L (ref 0–37)
Albumin: 4.1 g/dL (ref 3.5–5.2)
Alkaline Phosphatase: 100 U/L (ref 39–117)
BUN: 20 mg/dL (ref 6–23)
CO2: 31 mEq/L (ref 19–32)
Calcium: 9 mg/dL (ref 8.4–10.5)
Chloride: 105 mEq/L (ref 96–112)
Creatinine, Ser: 0.7 mg/dL (ref 0.40–1.20)
GFR: 84.82 mL/min (ref >60.00)
Glucose, Bld: 95 mg/dL (ref 70–99)
Potassium: 4.2 mEq/L (ref 3.5–5.1)
Sodium: 141 mEq/L (ref 135–145)
Total Bilirubin: 0.7 mg/dL (ref 0.2–1.2)
Total Protein: 6.4 g/dL (ref 6.0–8.3)

## 2021-12-27 LAB — LIPID PANEL
Cholesterol: 141 mg/dL (ref 0–200)
HDL: 57.5 mg/dL (ref >39.00)
LDL Cholesterol: 69 mg/dL (ref 0–99)
NonHDL: 83.7
Total CHOL/HDL Ratio: 2
Triglycerides: 73 mg/dL (ref 0.0–149.0)
VLDL: 14.6 mg/dL (ref 0.0–40.0)

## 2021-12-27 LAB — HEMOGLOBIN A1C: Hgb A1c MFr Bld: 5.9 % (ref 4.6–6.5)

## 2021-12-27 NOTE — Patient Instructions (Addendum)
Mychart [place] the date of your 2nd [facility] shot. ? ?Please stop by lab before you go ?If you have mychart- we will send your results within 3 business days of [place] receiving them.  ?If you do not have mychart- we will call you about results within 5 business days of [place] receiving them.  ?*please also note that you will see labs on mychart as soon as they post. I will later go in and write notes on them- will say "notes from Dr. Yong Channel"  ? ?Lets work on gradual weight loss- at least 5-10 lbs by next year (if not more) ? ?Recommended follow up: Return in about 1 year (around 12/28/2022) for physical or sooner if needed.  ?

## 2022-01-01 DIAGNOSIS — Z1231 Encounter for screening mammogram for malignant neoplasm of breast: Secondary | ICD-10-CM | POA: Diagnosis not present

## 2022-01-01 LAB — HM MAMMOGRAPHY

## 2022-01-04 ENCOUNTER — Encounter (INDEPENDENT_AMBULATORY_CARE_PROVIDER_SITE_OTHER): Payer: Medicare PPO | Admitting: Ophthalmology

## 2022-01-04 ENCOUNTER — Other Ambulatory Visit: Payer: Self-pay

## 2022-01-04 DIAGNOSIS — H353132 Nonexudative age-related macular degeneration, bilateral, intermediate dry stage: Secondary | ICD-10-CM | POA: Diagnosis not present

## 2022-01-04 DIAGNOSIS — H43813 Vitreous degeneration, bilateral: Secondary | ICD-10-CM

## 2022-01-08 ENCOUNTER — Encounter: Payer: Self-pay | Admitting: Family Medicine

## 2022-01-08 DIAGNOSIS — R928 Other abnormal and inconclusive findings on diagnostic imaging of breast: Secondary | ICD-10-CM | POA: Diagnosis not present

## 2022-01-17 ENCOUNTER — Other Ambulatory Visit: Payer: Self-pay | Admitting: Radiology

## 2022-01-17 DIAGNOSIS — N6012 Diffuse cystic mastopathy of left breast: Secondary | ICD-10-CM | POA: Diagnosis not present

## 2022-01-17 DIAGNOSIS — R928 Other abnormal and inconclusive findings on diagnostic imaging of breast: Secondary | ICD-10-CM | POA: Diagnosis not present

## 2022-01-22 ENCOUNTER — Other Ambulatory Visit: Payer: Self-pay | Admitting: Family Medicine

## 2022-02-27 ENCOUNTER — Other Ambulatory Visit: Payer: Self-pay | Admitting: Family Medicine

## 2022-03-13 DIAGNOSIS — L817 Pigmented purpuric dermatosis: Secondary | ICD-10-CM | POA: Diagnosis not present

## 2022-03-13 DIAGNOSIS — L570 Actinic keratosis: Secondary | ICD-10-CM | POA: Diagnosis not present

## 2022-03-29 ENCOUNTER — Ambulatory Visit (INDEPENDENT_AMBULATORY_CARE_PROVIDER_SITE_OTHER): Payer: Medicare PPO

## 2022-03-29 DIAGNOSIS — Z0000 Encounter for general adult medical examination without abnormal findings: Secondary | ICD-10-CM | POA: Diagnosis not present

## 2022-03-29 NOTE — Progress Notes (Signed)
Virtual Visit via Telephone Note  I connected with  [name] on 03/29/22 at 10:00 AM EDT by telephone and verified that I am speaking with the correct person using two identifiers.  Medicare Annual Wellness visit completed telephonically due to Covid-19 pandemic.   Persons participating in this call: This Health Coach and this patient.   Location: Patient: home Provider: Office   I discussed the limitations, risks, security and privacy concerns of performing an evaluation and management service by telephone and the availability of in person appointments. The patient expressed understanding and agreed to proceed.  Unable to perform video visit due to video visit attempted and failed and/or patient does not have video capability.   Some vital signs may be absent or patient reported.   Willette Brace, LPN   Subjective:   [name] is a 76 y.o. female who presents for Medicare Annual (Subsequent) preventive examination.  Review of Systems     Cardiac Risk Factors include: advanced age (>9mn, >>11women);obesity (BMI >30kg/m2);dyslipidemia     Objective:    There were no vitals filed for this visit. There is no height or weight on file to calculate BMI.     03/29/2022   10:08 AM 03/23/2021   10:27 AM 12/22/2019   10:12 AM 10/31/2017    8:36 AM 08/07/2016    2:43 PM 08/07/2016    9:58 AM 07/31/2016   10:23 AM  Advanced Directives  Does Patient Have a Medical Advance Directive? No No No No No No No  Would patient like information on creating a medical advance directive? No - Patient declined Yes (MAU/Ambulatory/Procedural Areas - Information given) No - Patient declined  No - patient declined information No - patient declined information No - patient declined information    Current Medications (verified) Outpatient Encounter Medications as of 03/29/2022  Medication Sig   apixaban (ELIQUIS) 5 MG TABS tablet TAKE 1 TABLET BY MOUTH TWICE DAILY . APPOINTMENT REQUIRED FOR  FUTURE REFILLS   Calcium Citrate 250 MG TABS Take 1,000 mg by mouth 2 (two) times daily.   Cholecalciferol (VITAMIN D3) 2000 UNITS TABS Take 2,000 Units by mouth 2 (two) times daily.   fish oil-omega-3 fatty acids 1000 MG capsule Take 2 capsules (2 g total) by mouth 2 (two) times daily. total for 4,000 mg   FLOVENT HFA 110 MCG/ACT inhaler INHALE 2 PUFFS INTO LUNGS TWICE DAILY   fluticasone (FLONASE) 50 MCG/ACT nasal spray Place 2 sprays into both nostrils daily.   GLUCOSAMINE SULFATE PO Take 2 tablets by mouth 2 (two) times daily.    LORATADINE PO Take 10 mg by mouth daily.   metoprolol tartrate (LOPRESSOR) 25 MG tablet Take 1 tablet by mouth twice daily   Multiple Vitamin (MULTIVITAMIN) tablet Take 1 tablet by mouth daily.   Multiple Vitamins-Minerals (PRESERVISION AREDS) CAPS    PROAIR HFA 108 (90 Base) MCG/ACT inhaler INHALE TWO PUFFS INTO LUNGS EVERY 6 HOURS AS NEEDED FOR WHEEZING FOR SHORTNESS OF BREATH   rosuvastatin (CRESTOR) 10 MG tablet Take 1 tablet (10 mg total) by mouth daily.   No facility-administered encounter medications on file as of 03/29/2022.    Allergies (verified) Atorvastatin, Cefuroxime axetil, and Other   History: Past Medical History:  Diagnosis Date   Allergy    ASCUS on Pap smear 2007   ASTHMA UNSPECIFIED WITH EXACERBATION 11/08/2008   [place]rial [place]    Cancer of skin of left ear    Chest pain 03/2011  negative stress test - Dr. Ron Parker   History of frequent urinary tract infections    History of kidney stones    HYPERLIPIDEMIA, BORDERLINE 02/09/2009   Kidney stones    OSTEOARTHRITIS 04/24/2007   Osteopenia    Osteoporosis    Pneumonia    PONV (postoperative nausea and vomiting)    SHINGLES 11/08/2008   had vaccine at drugstore 12/14   VARICOSE VEINS LOWER EXTREMITIES W/INFLAMMATION 02/05/2008   Past Surgical History:  Procedure Laterality Date   Bone spur shoulder     x2 removed   COLPOSCOPY     Neg HR HPV   KNEE ARTHROSCOPY     laser  surgery per left ey     secondary to film covering    lens implants- both eyes     SPINE SURGERY  2008   lumbar   TOTAL KNEE ARTHROPLASTY Right 08/07/2016   Procedure: RIGHT TOTAL KNEE ARTHROPLASTY;  Surgeon: Paralee Cancel, MD;  Location: [facility];  Service: Orthopedics;  Laterality: Right;   Family History  Problem Relation Age of Onset   Ovarian cancer Mother    Melanoma Mother    Dementia Mother    Hypertension Father    Heart disease Father        MI 62   Heart disease Paternal Grandfather    Cancer Maternal Grandmother        Oral cancer   Down syndrome Sister    Prostate cancer Maternal Uncle    Heart disease Maternal Grandfather    Prostate cancer Maternal Uncle    Prostate cancer Maternal Uncle    Breast cancer Cousin        two maternal cousins diagnosed <50   Diabetes Paternal Aunt    Sudden Cardiac Death Son        unclear cause.    Colon cancer Neg Hx    Esophageal cancer Neg Hx    Rectal cancer Neg Hx    Stomach cancer Neg Hx    Social History   Socioeconomic History   Marital status: Married    Spouse name: Not on file   Number of children: 2   Years of education: Not on file   Highest education level: Not on file  Occupational History   Occupation: Retired    Comment: Pharmacist, hospital   Tobacco Use   Smoking status: Never    Passive exposure: Yes   Smokeless tobacco: Never   Tobacco comments:    last exposure 1999  Vaping Use   Vaping Use: Never used  Substance and Sexual Activity   Alcohol use: Yes    Alcohol/week: 1.0 standard drink of alcohol    Types: 1 Standard drinks or equivalent per week    Comment: monthly   Drug use: No   Sexual activity: Not Currently    Birth control/protection: Post-menopausal  Other Topics Concern   Not on file  Social History Narrative   Married 44 years in 2015. 2 kids. 2 grandkids-14 and 12 in 2020.       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 In , got job in [employer]. 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       Hobbies: works with grandsons  in [place], Haematologist (hates housework), goes on 1 month motorcyle biking trip each year   Social Determinants of Health   Financial Resource Strain: Low Risk  (03/29/2022)   Overall Financial Resource Strain (CARDIA)    Difficulty of Paying Living Expenses: Not hard at all  Food Insecurity: No  Food Insecurity (03/29/2022)   Hunger Vital Sign    Worried About Running Out of Food in the Last Year: Never true    Ran Out of Food in the Last Year: Never true  Transportation Needs: No Transportation Needs (03/29/2022)   PRAPARE - Hydrologist (Medical): No    Lack of Transportation (Non-Medical): No  Physical Activity: Inactive (03/29/2022)   Exercise Vital Sign    Days of Exercise per Week: 0 days    Minutes of Exercise per Session: 0 min  Stress: No Stress Concern Present (03/29/2022)   [place]    Feeling of Stress : Not at all  Social Connections: Moderately Integrated (03/29/2022)   Social Connection and Isolation Panel [NHANES]    Frequency of Communication with Friends and Family: More than three times a week    Frequency of Social Gatherings with Friends and Family: More than three times a week    Attends Religious Services: 1 to 4 times per year    Active Member of [employer] or Organizations: No    Attends Music therapist: Never    Marital Status: Married    Tobacco Counseling Counseling given: Not Answered Tobacco comments: last exposure 1999   Clinical Intake:  Pre-visit preparation completed: Yes  Pain : No/denies pain     BMI - recorded: 43.43 Nutritional Status: BMI > 30  Obese Nutritional Risks: None Diabetes: No  How often do you need to have someone help you when you read instructions, pamphlets, or other written materials from your doctor or pharmacy?: 1 - Never  Diabetic?no  Interpreter Needed?: No  Information entered by :: Charlott Rakes,  LPN   Activities of Daily Living    03/29/2022   10:10 AM  In your present state of health, do you have any difficulty performing the following activities:  Hearing? 0  Comment have tinnitus  Vision? 0  Difficulty concentrating or making decisions? 0  Walking or climbing stairs? 0  Dressing or bathing? 0  Doing errands, shopping? 0  Preparing Food and eating ? N  Using the Toilet? N  In the past six months, have you accidently leaked urine? N  Do you have problems with loss of bowel control? N  Managing your Medications? N  Managing your Finances? N  Housekeeping or managing your Housekeeping? N    Patient Care Team: Marin Olp, MD as PCP - General (Family Medicine) Wellington Hampshire, MD as PCP - Cardiology (Cardiology) Huel Cote, NP (Inactive) as Nurse Practitioner (Obstetrics and Gynecology) Wellington Hampshire, MD as Consulting Physician (Cardiology)  Indicate any recent Medical Services you may have received from other than [facility] providers in the past year (date may be approximate).     Assessment:   This is a routine wellness examination for [place].  Hearing/Vision screen Hearing Screening - Comments:: Pt denies any hearing issues  Vision Screening - Comments:: Pt follows up with Dr Blima Singer and Dr Zigmund Daniel   Dietary issues and exercise activities discussed: Current Exercise Habits: The patient does not participate in regular exercise at present   Goals Addressed             This Visit's Progress    Patient Stated       Lose weight        Depression Screen    03/29/2022   10:06 AM 06/30/2021    2:14 PM 03/23/2021   10:23  AM 12/23/2020    8:16 AM 12/22/2019   10:13 AM 12/22/2019    9:17 AM 12/16/2018    8:59 AM  PHQ 2/9 Scores  PHQ - 2 Score 0 0 0 0 0 0 0    Fall Risk    03/29/2022   10:10 AM 06/30/2021    2:13 PM 03/23/2021   10:29 AM 12/23/2020    8:16 AM 12/22/2019   10:13 AM  [place] in the past year? 0 0 1 1 0  Number falls in past  yr: 0 0 1 0 0  Injury with Fall? 0 0 1 0 0  Comment   scrape knees    Risk for fall due to : Impaired vision;Impaired balance/gait No Fall Risks Impaired vision    Follow up Falls prevention discussed Falls evaluation completed Falls prevention discussed  Falls evaluation completed;Education provided;Falls prevention discussed    FALL RISK PREVENTION PERTAINING TO THE HOME:  Any stairs in or around the home? Yes  If so, are there any without handrails? No  Home free of loose throw rugs in walkways, pet beds, electrical cords, etc? Yes  Adequate lighting in your home to reduce risk of falls? Yes   ASSISTIVE DEVICES UTILIZED TO PREVENT FALLS:  Life alert? No  Use of a cane, walker or w/c? No  Grab bars in the bathroom? Yes  Shower chair or bench in shower? Yes  Elevated toilet seat or a handicapped toilet? No   TIMED UP AND GO:  Was the test performed? No .   Cognitive Function:        03/29/2022   10:12 AM 03/23/2021   10:31 AM 12/22/2019   10:13 AM  6CIT Screen  What Year? 0 points 0 points 0 points  What month? 0 points 0 points 0 points  What time? 0 points 0 points 0 points  Count back from 20 0 points 0 points 0 points  Months in reverse 0 points 0 points 0 points  Repeat phrase 0 points 0 points 0 points  Total Score 0 points 0 points 0 points    Immunizations Immunization History  Administered Date(s) Administered   Fluad Quad(high Dose 65+) 08/18/2020   Influenza Whole 08/15/2000, 08/06/2008   Influenza, High Dose Seasonal PF 10/25/2014, 07/16/2016, 07/16/2017, 08/13/2018   Influenza,inj,Quad PF,6+ Mos 08/01/2015, 07/30/2019   Influenza-Unspecified 08/15/2021   PFIZER Comirnaty(Gray Top)Covid-19 Tri-Sucrose Vaccine 04/26/2021   PFIZER(Purple Top)SARS-COV-2 Vaccination 11/19/2019, 12/10/2019, 10/26/2020   PPD Test 07/05/2014   Pfizer Covid-19 Vaccine Bivalent Booster 65yr & up 10/04/2021   Pneumococcal Conjugate-13 10/31/2015   Pneumococcal Polysaccharide-23  08/15/2000, 06/08/2013   Td 09/14/1998, 02/05/2008   Tdap 06/16/2019   Zoster Recombinat (Shingrix) 07/10/2021   Zoster, Live 11/06/2013    TDAP status: Up to date  Flu Vaccine status: Up to date  Pneumococcal vaccine status: Up to date  Covid-19 vaccine status: Completed vaccines  Qualifies for Shingles Vaccine? Yes   Zostavax completed Yes   Shingrix Completed?: Yes  Screening Tests Health Maintenance  Topic Date Due   Zoster Vaccines- Shingrix (2 of 2) 09/04/2021   INFLUENZA VACCINE  05/15/2022   COLONOSCOPY (Pts 45-444yrInsurance coverage will need to be confirmed)  11/30/2025   TETANUS/TDAP  06/15/2029   Pneumonia Vaccine 6541Years old  Completed   DEXA SCAN  Completed   COVID-19 Vaccine  Completed   Hepatitis C Screening  Completed   HPV VACCINES  Aged Out    Health Maintenance  Health Maintenance Due  Topic Date Due   Zoster Vaccines- Shingrix (2 of 2) 09/04/2021    Colorectal cancer screening: Type of screening: Colonoscopy. Completed 12/01/15. Repeat every 10 years  Mammogram status: Completed 01/01/22. Repeat every year  Bone Density status: Completed 12/26/20. Results reflect: Bone density results: OSTEOPENIA. Repeat every 3-5 years.   Additional Screening:  Hepatitis C Screening:  Completed 10/26/15  Vision Screening: Recommended annual ophthalmology exams for early detection of glaucoma and other disorders of the eye. Is the patient up to date with their annual eye exam?  Yes  Who is the provider or what is the name of the office in which the patient attends annual eye exams? Dr Blima Singer and Dr Zigmund Daniel  If pt is not established with a provider, would they like to be referred to a provider to establish care? No .   Dental Screening: Recommended annual dental exams for proper oral hygiene  Community Resource Referral / Chronic Care Management: CRR required this visit?  No   CCM required this visit?  No      Plan:     I have personally reviewed  and noted the following in the patient's chart:   Medical and social history Use of alcohol, tobacco or illicit drugs  Current medications and supplements including opioid prescriptions.  Functional ability and status Nutritional status Physical activity Advanced directives List of other physicians Hospitalizations, surgeries, and ER visits in previous 12 months Vitals Screenings to include cognitive, depression, and falls Referrals and appointments  In addition, I have reviewed and discussed with patient certain preventive protocols, quality metrics, and best practice recommendations. A written personalized care plan for preventive services as well as general preventive health recommendations were provided to patient.     Willette Brace, LPN   [id number]   Nurse Notes: None

## 2022-03-29 NOTE — Patient Instructions (Signed)
[name] , Thank you for taking time to come for your Medicare Wellness Visit. I appreciate your ongoing commitment to your health goals. Please review the following plan we discussed and let me know if I can assist you in the future.   Screening recommendations/referrals: Colonoscopy: Done 12/01/15 repeat every 10 years  Mammogram: Done 01/01/22 repeat every year  Bone Density: Done 12/26/20 repeat every 3-5 years  Recommended yearly ophthalmology/optometry visit for glaucoma screening and checkup Recommended yearly dental visit for hygiene and checkup  Vaccinations: Influenza vaccine: Done 08/15/21 repeat every year  Pneumococcal vaccine: Up to date Tdap vaccine: Done 06/16/19 repeat every 10 years  Shingles vaccine: 07/10/21 1st dose    Covid-19:Completed 2/4, 12/10/19 & 1/12, 7/13, 10/04/21  Advanced directives: Advance directive discussed with you today. Even though you declined this today please call our office should you change your mind and we can give you the proper paperwork for you to fill out.  Conditions/risks identified: lose weight   Next appointment: Follow up in one year for your annual wellness visit    Preventive Care 65 Years and Older, Female Preventive care refers to lifestyle choices and visits with your health care provider that can promote health and wellness. What does preventive care include? A yearly physical exam. This is also called an annual well check. Dental exams once or twice a year. Routine eye exams. Ask your health care provider how often you should have your eyes checked. Personal lifestyle choices, including: Daily care of your teeth and gums. Regular physical activity. Eating a healthy diet. Avoiding tobacco and drug use. Limiting alcohol use. Practicing safe sex. Taking low-dose aspirin every day. Taking vitamin and mineral supplements as recommended by your health care provider. What happens during an annual well check? The services and  screenings done by your health care provider during your annual well check will depend on your age, overall health, lifestyle risk factors, and family history of disease. Counseling  Your health care provider may ask you questions about your: Alcohol use. Tobacco use. Drug use. Emotional well-being. Home and relationship well-being. Sexual activity. Eating habits. History of falls. Memory and ability to understand (cognition). Work and work Statistician. Reproductive health. Screening  You may have the following tests or measurements: Height, weight, and BMI. Blood pressure. Lipid and cholesterol levels. These may be checked every 5 years, or more frequently if you are over 82 years old. Skin check. Lung cancer screening. You may have this screening every year starting at age 82 if you have a 30-pack-year history of smoking and currently smoke or have quit within the past 15 years. Fecal occult blood test (FOBT) of the stool. You may have this test every year starting at age 50. Flexible sigmoidoscopy or colonoscopy. You may have a sigmoidoscopy every 5 years or a colonoscopy every 10 years starting at age 74. Hepatitis C blood test. Hepatitis B blood test. Sexually transmitted disease (STD) testing. Diabetes screening. This is done by checking your blood sugar (glucose) after you have not eaten for a while (fasting). You may have this done every 1-3 years. Bone density scan. This is done to screen for osteoporosis. You may have this done starting at age 23. Mammogram. This may be done every 1-2 years. Talk to your health care provider about how often you should have regular mammograms. Talk with your health care provider about your test results, treatment options, and if necessary, the need for more tests. Vaccines  Your health care provider  may recommend certain vaccines, such as: Influenza vaccine. This is recommended every year. Tetanus, diphtheria, and acellular pertussis (Tdap,  Td) vaccine. You may need a Td booster every 10 years. Zoster vaccine. You may need this after age 78. Pneumococcal 13-valent conjugate (PCV13) vaccine. One dose is recommended after age 13. Pneumococcal polysaccharide (PPSV23) vaccine. One dose is recommended after age 49. Talk to your health care provider about which screenings and vaccines you need and how often you need them. This information is not intended to replace advice given to you by your health care provider. Make sure you discuss any questions you have with your health care provider. Document Released: 10/28/2015 Document Revised: 06/20/2016 Document Reviewed: 08/02/2015 Elsevier Interactive Patient Education  2017 [place] Prevention in the Home Falls can cause injuries. They can happen to people of all ages. There are many things you can do to make your home safe and to help prevent falls. What can I do on the outside of my home? Regularly fix the edges of walkways and driveways and fix any cracks. Remove anything that might make you trip as you walk through a door, such as a raised step or threshold. Trim any bushes or trees on the path to your home. Use bright outdoor lighting. Clear any walking paths of anything that might make someone trip, such as rocks or tools. Regularly check to see if handrails are loose or broken. Make sure that both sides of any steps have handrails. Any raised decks and porches should have guardrails on the edges. Have any leaves, snow, or ice cleared regularly. Use sand or salt on walking paths during winter. Clean up any spills in your garage right away. This includes oil or grease spills. What can I do in the bathroom? Use night lights. Install grab bars by the toilet and in the tub and shower. Do not use towel bars as grab bars. Use non-skid mats or decals in the tub or shower. If you need to sit down in the shower, use a plastic, non-slip stool. Keep the floor dry. Clean up any  water that spills on the floor as soon as it happens. Remove soap buildup in the tub or shower regularly. Attach bath mats securely with double-sided non-slip rug tape. Do not have throw rugs and other things on the floor that can make you trip. What can I do in the bedroom? Use night lights. Make sure that you have a light by your bed that is easy to reach. Do not use any sheets or blankets that are too big for your bed. They should not hang down onto the floor. Have a firm chair that has side arms. You can use this for support while you get dressed. Do not have throw rugs and other things on the floor that can make you trip. What can I do in the kitchen? Clean up any spills right away. Avoid walking on wet floors. Keep items that you use a lot in easy-to-reach places. If you need to reach something above you, use a strong step stool that has a grab bar. Keep electrical cords out of the way. Do not use floor polish or wax that makes floors slippery. If you must use wax, use non-skid floor wax. Do not have throw rugs and other things on the floor that can make you trip. What can I do with my stairs? Do not leave any items on the stairs. Make sure that there are handrails on both sides  of the stairs and use them. Fix handrails that are broken or loose. Make sure that handrails are as long as the stairways. Check any carpeting to make sure that it is firmly attached to the stairs. Fix any carpet that is loose or worn. Avoid having throw rugs at the top or bottom of the stairs. If you do have throw rugs, attach them to the floor with carpet tape. Make sure that you have a light switch at the top of the stairs and the bottom of the stairs. If you do not have them, ask someone to add them for you. What else can I do to help prevent falls? Wear shoes that: Do not have high heels. Have rubber bottoms. Are comfortable and fit you well. Are closed at the toe. Do not wear sandals. If you use a  stepladder: Make sure that it is fully opened. Do not climb a closed stepladder. Make sure that both sides of the stepladder are locked into place. Ask someone to hold it for you, if possible. Clearly mark and make sure that you can see: Any grab bars or handrails. First and last steps. Where the edge of each step is. Use tools that help you move around (mobility aids) if they are needed. These include: Canes. Walkers. Scooters. Crutches. Turn on the lights when you go into a dark area. Replace any light bulbs as soon as they burn out. Set up your furniture so you have a clear path. Avoid moving your furniture around. If any of your floors are uneven, fix them. If there are any pets around you, be aware of where they are. Review your medicines with your doctor. Some medicines can make you feel dizzy. This can increase your chance of falling. Ask your doctor what other things that you can do to help prevent falls. This information is not intended to replace advice given to you by your health care provider. Make sure you discuss any questions you have with your health care provider. Document Released: 07/28/2009 Document Revised: 03/08/2016 Document Reviewed: 11/05/2014 Elsevier Interactive Patient Education  2017 [employer].

## 2022-03-31 ENCOUNTER — Other Ambulatory Visit: Payer: Self-pay | Admitting: Family Medicine

## 2022-04-22 ENCOUNTER — Other Ambulatory Visit: Payer: Self-pay | Admitting: Family Medicine

## 2022-05-08 DIAGNOSIS — I10 Essential (primary) hypertension: Secondary | ICD-10-CM | POA: Diagnosis not present

## 2022-05-08 DIAGNOSIS — H3530 Unspecified macular degeneration: Secondary | ICD-10-CM | POA: Diagnosis not present

## 2022-05-08 DIAGNOSIS — G8929 Other chronic pain: Secondary | ICD-10-CM | POA: Diagnosis not present

## 2022-05-08 DIAGNOSIS — I4891 Unspecified atrial fibrillation: Secondary | ICD-10-CM | POA: Diagnosis not present

## 2022-05-08 DIAGNOSIS — E785 Hyperlipidemia, unspecified: Secondary | ICD-10-CM | POA: Diagnosis not present

## 2022-05-08 DIAGNOSIS — Z6841 Body Mass Index (BMI) 40.0 and over, adult: Secondary | ICD-10-CM | POA: Diagnosis not present

## 2022-05-08 DIAGNOSIS — J45909 Unspecified asthma, uncomplicated: Secondary | ICD-10-CM | POA: Diagnosis not present

## 2022-05-08 DIAGNOSIS — D6869 Other thrombophilia: Secondary | ICD-10-CM | POA: Diagnosis not present

## 2022-05-21 ENCOUNTER — Other Ambulatory Visit: Payer: Self-pay | Admitting: Family Medicine

## 2022-05-25 ENCOUNTER — Other Ambulatory Visit: Payer: Self-pay | Admitting: Family

## 2022-05-25 DIAGNOSIS — M79604 Pain in right leg: Secondary | ICD-10-CM | POA: Diagnosis not present

## 2022-05-25 DIAGNOSIS — I480 Paroxysmal atrial fibrillation: Secondary | ICD-10-CM

## 2022-05-25 DIAGNOSIS — M25562 Pain in left knee: Secondary | ICD-10-CM | POA: Diagnosis not present

## 2022-05-25 DIAGNOSIS — Z7901 Long term (current) use of anticoagulants: Secondary | ICD-10-CM

## 2022-05-25 NOTE — Telephone Encounter (Signed)
Prescription refill request for Eliquis received. Indication: Afib  Last office visit: 09/15/21 Fletcher Anon) Scr: 0.70 (12/27/21) Age: 76 Weight: 114.8kg  Appropriate dose and refill sent to requested pharmacy.

## 2022-06-04 ENCOUNTER — Telehealth: Payer: Self-pay | Admitting: Cardiovascular Disease

## 2022-06-04 NOTE — Telephone Encounter (Signed)
Will send medication question to Dr. Fletcher Anon and covering RN to further review, advise, and follow-up with the pt accordingly thereafter.   Pt asking if prednisone is safe to take from a cardiac perspective and with current cardiac meds.

## 2022-06-04 NOTE — Telephone Encounter (Signed)
  Pt c/o medication issue:  1. Name of Medication: prednisone shot  2. How are you currently taking this medication (dosage and times per day)?   3. Are you having a reaction (difficulty breathing--STAT)?   4. What is your medication issue? Pt would like to ask Dr. Fletcher Anon if its ok for her to get prednisone shot with her heart medications

## 2022-06-06 NOTE — Telephone Encounter (Signed)
Ok to have prednisone injection. Could potentially increase afib burden but less likely with single injection vs chronic use.

## 2022-06-06 NOTE — Telephone Encounter (Signed)
Patient made of [name], PharmDs response and recommendation. Patient sts that it would be single injection to her L knee. Pt sts that she is taking a trip to [facility] and will be doing a lot of walking. Patient verbalized understanding and voiced appreciation for the call back.

## 2022-06-06 NOTE — Telephone Encounter (Signed)
Pt is calling again to get information in regards to getting prednisone shot. Please advise.

## 2022-06-15 DIAGNOSIS — M25562 Pain in left knee: Secondary | ICD-10-CM | POA: Diagnosis not present

## 2022-06-19 ENCOUNTER — Other Ambulatory Visit: Payer: Self-pay | Admitting: Family Medicine

## 2022-07-09 ENCOUNTER — Encounter: Payer: Self-pay | Admitting: *Deleted

## 2022-08-25 ENCOUNTER — Other Ambulatory Visit: Payer: Self-pay | Admitting: Family Medicine

## 2022-08-27 ENCOUNTER — Telehealth: Payer: Self-pay | Admitting: Family Medicine

## 2022-08-27 NOTE — Telephone Encounter (Signed)
Pt states insurance will not longer cover Flovent as of 10/15/22 unless medically necessary. Verification will be needed.   Pt is also asking if she should get RSV vaccine. Please advise

## 2022-08-28 MED ORDER — ARNUITY ELLIPTA 100 MCG/ACT IN AEPB
100.0000 ug | INHALATION_SPRAY | Freq: Every day | RESPIRATORY_TRACT | 11 refills | Status: DC
Start: 2022-08-28 — End: 2023-07-01

## 2022-08-28 NOTE — Telephone Encounter (Signed)
Called and spoke with pt and Pt states that Arnuity elipta is the alternative inhaler that they will cover.

## 2022-08-28 NOTE — Telephone Encounter (Signed)
Great job [facility] in [place] and stopped [employer] on her list.  Yes with her lungs reasonable to get RSV vaccination at pharmacy

## 2022-09-11 ENCOUNTER — Other Ambulatory Visit: Payer: Self-pay | Admitting: Cardiovascular Disease

## 2022-09-11 DIAGNOSIS — I480 Paroxysmal atrial fibrillation: Secondary | ICD-10-CM

## 2022-09-25 ENCOUNTER — Ambulatory Visit: Payer: Medicare PPO | Attending: Cardiovascular Disease | Admitting: Cardiovascular Disease

## 2022-09-25 ENCOUNTER — Encounter: Payer: Self-pay | Admitting: Cardiovascular Disease

## 2022-09-25 VITALS — BP 112/74 | HR 69 | Ht 64.0 in | Wt 247.5 lb

## 2022-09-25 DIAGNOSIS — E785 Hyperlipidemia, unspecified: Secondary | ICD-10-CM

## 2022-09-25 DIAGNOSIS — I480 Paroxysmal atrial fibrillation: Secondary | ICD-10-CM | POA: Diagnosis not present

## 2022-09-25 NOTE — Progress Notes (Signed)
Cardiology Office Note   Date:  09/25/2022   ID:  Jonathon, Castelo 1945/11/08, MRN [id number]  PCP:  Marin Olp, MD  Cardiologist:   Kathlyn Sacramento, MD   Chief Complaint  Patient presents with   Follow-up    12 month f/u, no new cardiac concerns        History of Present Illness: [name] is a 76 y.o. female who presents for a follow-up visit regarding paroxysmal atrial fibrillation.  She has known history of COPD from second hand smoking, and hyperlipidemia. She is a lifelong nonsmoker. She has no history of hypertension or diabetes.  She had a nuclear stress test in 2016 for atypical chest pain and back normal. The patient is on metoprolol and Eliquis. She has been doing very well with no recent chest pain, shortness of breath or palpitations.  No side effects with Eliquis anticoagulation.   Past Medical History:  Diagnosis Date   Allergy    ASCUS on Pap smear 2007   ASTHMA UNSPECIFIED WITH EXACERBATION 11/08/2008   Atrial fibrillation (HCC)    Cancer of skin of left ear    Chest pain 03/2011   negative stress test - Dr. Ron Parker   History of frequent urinary tract infections    History of kidney stones    HYPERLIPIDEMIA, BORDERLINE 02/09/2009   Kidney stones    OSTEOARTHRITIS 04/24/2007   Osteopenia    Osteoporosis    Pneumonia    PONV (postoperative nausea and vomiting)    SHINGLES 11/08/2008   had vaccine at drugstore 12/14   VARICOSE VEINS LOWER EXTREMITIES W/INFLAMMATION 02/05/2008    Past Surgical History:  Procedure Laterality Date   Bone spur shoulder     x2 removed   COLPOSCOPY     Neg HR HPV   KNEE ARTHROSCOPY     laser surgery per left ey     secondary to film covering    lens implants- both eyes     SPINE SURGERY  2008   lumbar   TOTAL KNEE ARTHROPLASTY Right 08/07/2016   Procedure: RIGHT TOTAL KNEE ARTHROPLASTY;  Surgeon: Paralee Cancel, MD;  Location: [facility];  Service: Orthopedics;  Laterality: Right;     Current Outpatient  Medications  Medication Sig Dispense Refill   apixaban (ELIQUIS) 5 MG TABS tablet TAKE 1 TABLET BY MOUTH TWICE DAILY . APPOINTMENT REQUIRED FOR FUTURE REFILLS 60 tablet 5   Calcium Citrate 250 MG TABS Take 1,000 mg by mouth 2 (two) times daily.     Cholecalciferol (VITAMIN D3) 2000 UNITS TABS Take 2,000 Units by mouth 2 (two) times daily.     fish oil-omega-3 fatty acids 1000 MG capsule Take 2 capsules (2 g total) by mouth 2 (two) times daily. total for 4,000 mg     fluticasone (FLONASE) 50 MCG/ACT nasal spray Place 2 sprays into both nostrils daily. 16 g 3   Fluticasone Furoate (ARNUITY ELLIPTA) 100 MCG/ACT AEPB Inhale 100 mcg into the lungs daily. 30 each 11   GLUCOSAMINE SULFATE PO Take 2 tablets by mouth 2 (two) times daily.      LORATADINE PO Take 10 mg by mouth daily.     metoprolol tartrate (LOPRESSOR) 25 MG tablet Take 1 tablet by mouth twice daily 180 tablet 0   Multiple Vitamin (MULTIVITAMIN) tablet Take 1 tablet by mouth daily.     Multiple Vitamins-Minerals (PRESERVISION AREDS) CAPS      PROAIR HFA 108 (90 Base) MCG/ACT inhaler INHALE TWO PUFFS  INTO LUNGS EVERY 6 HOURS AS NEEDED FOR WHEEZING FOR SHORTNESS OF BREATH 9 each 5   rosuvastatin (CRESTOR) 10 MG tablet Take 1 tablet (10 mg total) by mouth daily. 90 tablet 3   No current facility-administered medications for this visit.    Allergies:   Atorvastatin, Cefuroxime axetil, and Other    Social History:  The patient  reports that she has never smoked. She has been exposed to tobacco smoke. She has never used smokeless tobacco. She reports current alcohol use of about 1.0 standard drink of alcohol per week. She reports that she does not use drugs.   Family History:  The patient's family history includes Breast cancer in her cousin; Cancer in her maternal grandmother; Dementia in her mother; Diabetes in her paternal aunt; Down syndrome in her sister; Heart disease in her father, maternal grandfather, and paternal grandfather;  Hypertension in her father; Melanoma in her mother; Ovarian cancer in her mother; Prostate cancer in her maternal uncle, maternal uncle, and maternal uncle; Sudden Cardiac Death in her son.    ROS:  Please see the history of present illness.   Otherwise, review of systems are positive for none.   All other systems are reviewed and negative.    PHYSICAL EXAM: VS:  BP 112/74 (BP Location: Left Arm, Patient Position: Sitting, Cuff Size: Large)   Pulse 69   Ht '5\' 4"'$  (1.626 m)   Wt 247 lb 8 oz (112.3 kg)   SpO2 96%   BMI 42.48 kg/m  , BMI Body mass index is 42.48 kg/m. GEN: Well nourished, well developed, in no acute distress  HEENT: normal  Neck: no JVD, carotid bruits, or masses Cardiac: RRR; no murmurs, rubs, or gallops,no edema  Respiratory:  clear to auscultation bilaterally, normal work of breathing GI: soft, nontender, nondistended, + BS MS: no deformity or atrophy  Skin: warm and dry, no rash Neuro:  Strength and sensation are intact Psych: euthymic mood, full affect   EKG:  EKG is ordered today. The ekg ordered today demonstrates normal sinus rhythm with low voltage.  No significant changes from before.   Recent Labs: 12/27/2021: ALT 23; BUN 20; Creatinine, Ser 0.70; Hemoglobin 12.8; Platelets 144.0; Potassium 4.2; Sodium 141    Lipid Panel    Component Value Date/Time   CHOL 141 12/27/2021 1029   CHOL 221 (H) 06/17/2015 0856   TRIG 73.0 12/27/2021 1029   HDL 57.50 12/27/2021 1029   HDL 67 06/17/2015 0856   CHOLHDL 2 12/27/2021 1029   VLDL 14.6 12/27/2021 1029   LDLCALC 69 12/27/2021 1029   LDLCALC 142 (H) 06/17/2015 0856   LDLDIRECT 149.5 10/24/2012 0831      Wt Readings from Last 3 Encounters:  09/25/22 247 lb 8 oz (112.3 kg)  12/27/21 253 lb (114.8 kg)  10/04/21 247 lb 6.4 oz (112.2 kg)           No data to display            ASSESSMENT AND PLAN:  1.   Paroxysmal atrial fibrillation: She is doing well on metoprolol with no recurrent  symptoms.   She is tolerating anticoagulation with Eliquis with no bleeding complications.   I reviewed most recent labs done in March which were unremarkable with only mild thrombocytopenia at 144,000.  Renal function was normal.  2. Hyperlipidemia: Continue treatment with rosuvastatin.  Most recent lipid profile showed an LDL of 69.   Disposition:   FU with me in 12 months.  Signed,  Kathlyn Sacramento, MD  09/25/2022 3:35 PM    [place]

## 2022-09-25 NOTE — Patient Instructions (Signed)
Medication Instructions:  No changes *If you need a refill on your cardiac medications before your next appointment, please call your pharmacy*   Lab Work: None ordered If you have labs (blood work) drawn today and your tests are completely normal, you will receive your results only by: [place] (if you have MyChart) OR A paper copy in the mail If you have any lab test that is abnormal or we need to change your treatment, we will call you to review the results.   Testing/Procedures: None ordered   Follow-Up: At [facility], you and your health needs are our priority.  As part of our continuing mission to provide you with exceptional heart care, we have created designated Provider Care Teams.  These Care Teams include your primary Cardiologist (physician) and Advanced Practice Providers (APPs -  Physician Assistants and Nurse Practitioners) who all work together to provide you with the care you need, when you need it.  We recommend signing up for the patient portal called "MyChart".  Sign up information is provided on this After Visit Summary.  MyChart is used to connect with patients for Virtual Visits (Telemedicine).  Patients are able to view lab/test results, encounter notes, upcoming appointments, etc.  Non-urgent messages can be sent to your provider as well.   To learn more about what you can do with MyChart, go to [email or web address].    Your next appointment:   12 month(s)  The format for your next appointment:   In Person  Provider:   You may see Kathlyn Sacramento, MD or one of the following Advanced Practice Providers on your designated Care Team:   Murray Hodgkins, NP Christell Faith, PA-C Cadence Kathlen Mody, PA-C Gerrie Nordmann, NP    Important Information About Sugar

## 2022-09-27 ENCOUNTER — Encounter: Payer: Self-pay | Admitting: *Deleted

## 2022-10-24 ENCOUNTER — Ambulatory Visit: Payer: Medicare PPO | Admitting: Family Medicine

## 2022-10-24 ENCOUNTER — Encounter: Payer: Self-pay | Admitting: Family Medicine

## 2022-10-24 VITALS — BP 118/64 | HR 77 | Temp 97.8°F | Ht 64.0 in | Wt 255.4 lb

## 2022-10-24 DIAGNOSIS — I480 Paroxysmal atrial fibrillation: Secondary | ICD-10-CM

## 2022-10-24 DIAGNOSIS — J441 Chronic obstructive pulmonary disease with (acute) exacerbation: Secondary | ICD-10-CM

## 2022-10-24 DIAGNOSIS — R059 Cough, unspecified: Secondary | ICD-10-CM

## 2022-10-24 LAB — POCT INFLUENZA A/B
Influenza A, POC: NEGATIVE
Influenza B, POC: NEGATIVE

## 2022-10-24 LAB — POC COVID19 BINAXNOW: SARS Coronavirus 2 Ag: NEGATIVE

## 2022-10-24 MED ORDER — AMOXICILLIN-POT CLAVULANATE 875-125 MG PO TABS: 1.0000 | ORAL_TABLET | Freq: Two times a day (BID) | ORAL | 0 refills | Status: AC

## 2022-10-24 MED ORDER — PREDNISONE 20 MG PO TABS: ORAL_TABLET | ORAL | 0 refills | Status: DC

## 2022-10-24 NOTE — Patient Instructions (Addendum)
COPD exacerbation-meets criteria for antibiotics based on increased sputum and increased sputum purulence-treat with Augmentin for 7 days (she has tolerated this in the past despite cephalosporin allergy).  Also treat with prednisone.  She can use albuterol as needed during this timeframe-perhaps over the next 24 hours try every 6 hours to settle the lungs down. - This does slightly increase her prediabetes risk with use of prednisone but we think benefits outweigh the risks  Recommended follow up: Return for as needed for new, worsening, persistent symptoms.

## 2022-10-24 NOTE — Progress Notes (Signed)
Phone [phone] In person visit   Subjective:   [name] is a 77 y.o. year old very pleasant female patient who presents for/with See problem oriented charting Chief Complaint  Patient presents with   Cough    Pt c/o cough with green mucous that started Friday.   Past Medical History-  Patient Active Problem List   Diagnosis Date Noted   Paroxysmal atrial fibrillation ([place]) 12/11/2017    Priority: High   Macular degeneration 12/22/2019    Priority: Medium    Atypical chest pain 12/27/2014    Priority: Medium    Hyperlipidemia 02/09/2009    Priority: Medium    COPD (chronic obstructive pulmonary disease) ([place]) 02/05/2008    Priority: Medium    S/P right TKA 08/07/2016    Priority: Low   Dysphagia 11/14/2015    Priority: Low   Class 3 severe obesity due to excess calories without serious comorbidity with body mass index (BMI) of 40.0 to 44.9 in adult [facility]) 06/17/2014    Priority: Low   Kidney stones 06/17/2014    Priority: Low   Osteopenia     Priority: Low   Allergic rhinitis 05/07/2008    Priority: Low   VARICOSE VEINS LOWER EXTREMITIES W/INFLAMMATION 02/05/2008    Priority: Low   Osteoarthritis 04/24/2007    Priority: Low    Medications- reviewed and updated Current Outpatient Medications  Medication Sig Dispense Refill   amoxicillin-clavulanate (AUGMENTIN) 875-125 MG tablet Take 1 tablet by mouth 2 (two) times daily for 7 days. 14 tablet 0   apixaban (ELIQUIS) 5 MG TABS tablet TAKE 1 TABLET BY MOUTH TWICE DAILY . APPOINTMENT REQUIRED FOR FUTURE REFILLS 60 tablet 5   Calcium Citrate 250 MG TABS Take 1,000 mg by mouth 2 (two) times daily.     Cholecalciferol (VITAMIN D3) 2000 UNITS TABS Take 2,000 Units by mouth 2 (two) times daily.     fish oil-omega-3 fatty acids 1000 MG capsule Take 2 capsules (2 g total) by mouth 2 (two) times daily. total for 4,000 mg     fluticasone (FLONASE) 50 MCG/ACT nasal spray Place 2 sprays into both nostrils daily. 16 g 3    Fluticasone Furoate (ARNUITY ELLIPTA) 100 MCG/ACT AEPB Inhale 100 mcg into the lungs daily. 30 each 11   GLUCOSAMINE SULFATE PO Take 2 tablets by mouth 2 (two) times daily.      LORATADINE PO Take 10 mg by mouth daily.     metoprolol tartrate (LOPRESSOR) 25 MG tablet Take 1 tablet by mouth twice daily 180 tablet 0   Multiple Vitamin (MULTIVITAMIN) tablet Take 1 tablet by mouth daily.     Multiple Vitamins-Minerals (PRESERVISION AREDS) CAPS      predniSONE (DELTASONE) 20 MG tablet Take 2 pills for 3 days, 1 pill for 4 days 10 tablet 0   PROAIR HFA 108 (90 Base) MCG/ACT inhaler INHALE TWO PUFFS INTO LUNGS EVERY 6 HOURS AS NEEDED FOR WHEEZING FOR SHORTNESS OF BREATH 9 each 5   rosuvastatin (CRESTOR) 10 MG tablet Take 1 tablet (10 mg total) by mouth daily. 90 tablet 3   No current facility-administered medications for this visit.     Objective:  BP 118/64   Pulse 77   Temp 97.8 F (36.6 C)   Ht '5\' 4"'$  (1.626 m)   Wt 255 lb 6.4 oz (115.8 kg)   SpO2 93%   BMI 43.84 kg/m  Gen: NAD, resting comfortably Nasal turbinates erythematous and swollen, right maxillary sinus tenderness noted but rest  of sinuses nontender, pharynx largely normal  CV: RRR no murmurs rubs or gallops Lungs: CTAB no crackles, wheeze(at present), rhonchi Ext: no edema Skin: warm, dry  Results for orders placed or performed in visit on 10/24/22 (from the past 24 hour(s))  POCT Influenza A/B     Status: None   Collection Time: 10/24/22  8:07 AM  Result Value Ref Range   Influenza A, POC Negative Negative   Influenza B, POC Negative Negative  POC COVID-19     Status: None   Collection Time: 10/24/22  8:08 AM  Result Value Ref Range   SARS Coronavirus 2 Ag Negative Negative       Assessment and Plan     # Atrial fibrillation- follows with Dr. Fletcher Anon S: Rate controlled with metoprolol 25 mg twice daily Anticoagulated with Eliquis 5 mg twice daily A/P: appropriately anticoagulated and rate controlled- continue  current medicine  #COPD S: Medication: arnuiity ellipta 100 mcg daily.   Has albuterol inhaler and typically rarely uses- has not started yet with illness .   Patient started with a cough with green mucus on Friday. Notes chest congestion. Some wheeze. Nasal congestion and drainage as well. Symptoms are worsening.  No shortness of breath or chest pain . Feeling run down and tired A/P: COPD exacerbation-meets criteria for antibiotics based on increased sputum and increased sputum purulence-treat with Augmentin for 7 days (she has tolerated this in the past despite cephalosporin allergy).  Also treat with prednisone.  She can use albuterol as needed during this timeframe-perhaps over the next 24 hours try every 6 hours to settle the lungs down. - This does slightly increase her prediabetes risk with use of prednisone but we think benefits outweigh the risks -also with some sinus pressure and on day 6 and worsening- augmentin should cover that as well  Recommended follow up: Return for as needed for new, worsening, persistent symptoms. Future Appointments  Date Time Provider [place]  12/31/2022 10:00 AM Marin Olp, MD [facility]  01/09/2023  9:15 AM Hayden Pedro, MD [facility] None  04/05/2023 10:15 AM [facility] HEALTH COACH [facility]   Lab/Order associations:   ICD-10-CM   1. COPD exacerbation ([place])  J44.1     2. Cough, unspecified type  R05.9 POC COVID-19    POCT Influenza A/B    3. PAF (paroxysmal atrial fibrillation) (HCC) Chronic I48.0       Meds ordered this encounter  Medications   amoxicillin-clavulanate (AUGMENTIN) 875-125 MG tablet    Sig: Take 1 tablet by mouth 2 (two) times daily for 7 days.    Dispense:  14 tablet    Refill:  0   predniSONE (DELTASONE) 20 MG tablet    Sig: Take 2 pills for 3 days, 1 pill for 4 days    Dispense:  10 tablet    Refill:  0    Return precautions advised.  Garret Reddish, MD

## 2022-11-03 ENCOUNTER — Other Ambulatory Visit: Payer: Self-pay | Admitting: Cardiovascular Disease

## 2022-11-03 DIAGNOSIS — E782 Mixed hyperlipidemia: Secondary | ICD-10-CM

## 2022-11-18 ENCOUNTER — Other Ambulatory Visit: Payer: Self-pay | Admitting: Cardiovascular Disease

## 2022-11-18 DIAGNOSIS — Z7901 Long term (current) use of anticoagulants: Secondary | ICD-10-CM

## 2022-11-18 DIAGNOSIS — I480 Paroxysmal atrial fibrillation: Secondary | ICD-10-CM

## 2022-11-19 NOTE — Telephone Encounter (Signed)
Prescription refill request for Eliquis received. Indication:afib Last office visit:12/23 Scr:0.7  3/23 Age: 77 Weight: 115.6  kg  Prescription refilled

## 2022-11-21 ENCOUNTER — Telehealth: Payer: Self-pay | Admitting: Cardiovascular Disease

## 2022-11-21 DIAGNOSIS — I480 Paroxysmal atrial fibrillation: Secondary | ICD-10-CM

## 2022-11-21 MED ORDER — METOPROLOL TARTRATE 25 MG PO TABS: 25.0000 mg | ORAL_TABLET | Freq: Two times a day (BID) | ORAL | 3 refills | Status: DC

## 2022-11-21 NOTE — Telephone Encounter (Signed)
Requested Prescriptions   Signed Prescriptions Disp Refills  . metoprolol tartrate (LOPRESSOR) 25 MG tablet 180 tablet 3    Sig: Take 1 tablet (25 mg total) by mouth 2 (two) times daily.    Authorizing Provider: ARIDA, MUHAMMAD A    Ordering User: Andrei Mccook C    

## 2022-11-21 NOTE — Telephone Encounter (Signed)
*  STAT* If patient is at the pharmacy, call can be transferred to refill team.   1. Which medications need to be refilled? (please list name of each medication and dose if known) metoprolol tartrate (LOPRESSOR) 25 MG tablet   2. Which pharmacy/location (including street and city if local pharmacy) is medication to be sent to?   [place] [address] [place] Phone: [phone]  Fax: [phone]       3. Do they need a 30 day or 90 day supply? [place]

## 2022-12-06 ENCOUNTER — Other Ambulatory Visit: Payer: Self-pay | Admitting: Cardiovascular Disease

## 2022-12-06 DIAGNOSIS — I480 Paroxysmal atrial fibrillation: Secondary | ICD-10-CM

## 2022-12-25 DIAGNOSIS — Z86018 Personal history of other benign neoplasm: Secondary | ICD-10-CM | POA: Diagnosis not present

## 2022-12-25 DIAGNOSIS — Z85828 Personal history of other malignant neoplasm of skin: Secondary | ICD-10-CM | POA: Diagnosis not present

## 2022-12-25 DIAGNOSIS — L814 Other melanin hyperpigmentation: Secondary | ICD-10-CM | POA: Diagnosis not present

## 2022-12-25 DIAGNOSIS — D225 Melanocytic nevi of trunk: Secondary | ICD-10-CM | POA: Diagnosis not present

## 2022-12-25 DIAGNOSIS — L578 Other skin changes due to chronic exposure to nonionizing radiation: Secondary | ICD-10-CM | POA: Diagnosis not present

## 2022-12-25 DIAGNOSIS — L821 Other seborrheic keratosis: Secondary | ICD-10-CM | POA: Diagnosis not present

## 2022-12-31 ENCOUNTER — Ambulatory Visit (INDEPENDENT_AMBULATORY_CARE_PROVIDER_SITE_OTHER): Payer: Medicare PPO | Admitting: Family Medicine

## 2022-12-31 ENCOUNTER — Encounter: Payer: Self-pay | Admitting: Family Medicine

## 2022-12-31 VITALS — BP 120/80 | HR 63 | Temp 97.3°F | Ht 64.0 in | Wt 259.8 lb

## 2022-12-31 DIAGNOSIS — E785 Hyperlipidemia, unspecified: Secondary | ICD-10-CM

## 2022-12-31 DIAGNOSIS — R739 Hyperglycemia, unspecified: Secondary | ICD-10-CM | POA: Diagnosis not present

## 2022-12-31 DIAGNOSIS — Z131 Encounter for screening for diabetes mellitus: Secondary | ICD-10-CM

## 2022-12-31 DIAGNOSIS — I480 Paroxysmal atrial fibrillation: Secondary | ICD-10-CM

## 2022-12-31 DIAGNOSIS — Z0000 Encounter for general adult medical examination without abnormal findings: Secondary | ICD-10-CM | POA: Diagnosis not present

## 2022-12-31 LAB — COMPREHENSIVE METABOLIC PANEL
ALT: 18 U/L (ref 0–35)
AST: 18 U/L (ref 0–37)
Albumin: 3.9 g/dL (ref 3.5–5.2)
Alkaline Phosphatase: 92 U/L (ref 39–117)
BUN: 21 mg/dL (ref 6–23)
CO2: 29 mEq/L (ref 19–32)
Calcium: 9.2 mg/dL (ref 8.4–10.5)
Chloride: 105 mEq/L (ref 96–112)
Creatinine, Ser: 0.78 mg/dL (ref 0.40–1.20)
GFR: 73.97 mL/min (ref >60.00)
Glucose, Bld: 96 mg/dL (ref 70–99)
Potassium: 4.6 mEq/L (ref 3.5–5.1)
Sodium: 140 mEq/L (ref 135–145)
Total Bilirubin: 0.8 mg/dL (ref 0.2–1.2)
Total Protein: 6.5 g/dL (ref 6.0–8.3)

## 2022-12-31 LAB — CBC WITH DIFFERENTIAL/PLATELET
Basophils Absolute: 0 10*3/uL (ref 0.0–0.1)
Basophils Relative: 0.9 % (ref 0.0–3.0)
Eosinophils Absolute: 0.1 10*3/uL (ref 0.0–0.7)
Eosinophils Relative: 1 % (ref 0.0–5.0)
HCT: 40.7 % (ref 36.0–46.0)
Hemoglobin: 13.4 g/dL (ref 12.0–15.0)
Lymphocytes Relative: 16.1 % (ref 12.0–46.0)
Lymphs Abs: 0.9 10*3/uL (ref 0.7–4.0)
MCHC: 32.8 g/dL (ref 30.0–36.0)
MCV: 97.4 fl (ref 78.0–100.0)
Monocytes Absolute: 0.4 10*3/uL (ref 0.1–1.0)
Monocytes Relative: 7.7 % (ref 3.0–12.0)
Neutro Abs: 4 10*3/uL (ref 1.4–7.7)
Neutrophils Relative %: 74.3 % (ref 43.0–77.0)
Platelets: 167 10*3/uL (ref 150.0–400.0)
RBC: 4.18 Mil/uL (ref 3.87–5.11)
RDW: 13.7 % (ref 11.5–15.5)
WBC: 5.4 10*3/uL (ref 4.0–10.5)

## 2022-12-31 LAB — LIPID PANEL
Cholesterol: 140 mg/dL (ref 0–200)
HDL: 56.5 mg/dL (ref >39.00)
LDL Cholesterol: 64 mg/dL (ref 0–99)
NonHDL: 83.78
Total CHOL/HDL Ratio: 2
Triglycerides: 99 mg/dL (ref 0.0–149.0)
VLDL: 19.8 mg/dL (ref 0.0–40.0)

## 2022-12-31 LAB — HEMOGLOBIN A1C: Hgb A1c MFr Bld: 5.8 % (ref 4.6–6.5)

## 2022-12-31 NOTE — Progress Notes (Signed)
Phone [phone]   Subjective:  Patient presents today for their annual physical. Chief complaint-noted.   See problem oriented charting- ROS- full  review of systems was completed and negative except for: some shortness of breath with activity with known copd but not worsening  The following were reviewed and entered/updated in epic: Past Medical History:  Diagnosis Date   Allergy    ASCUS on Pap smear 2007   ASTHMA UNSPECIFIED WITH EXACERBATION 11/08/2008   Atrial fibrillation ([place])    Cancer of skin of left ear    Chest pain 03/2011   negative stress test - Dr. Ron Parker   History of frequent urinary tract infections    History of kidney stones    HYPERLIPIDEMIA, BORDERLINE 02/09/2009   Kidney stones    OSTEOARTHRITIS 04/24/2007   Osteopenia    Osteoporosis    Pneumonia    PONV (postoperative nausea and vomiting)    SHINGLES 11/08/2008   had vaccine at drugstore 12/14   VARICOSE VEINS LOWER EXTREMITIES W/INFLAMMATION 02/05/2008   Patient Active Problem List   Diagnosis Date Noted   Paroxysmal atrial fibrillation ([place]) 12/11/2017    Priority: High   Hyperglycemia 12/31/2022    Priority: Medium    Macular degeneration 12/22/2019    Priority: Medium    Atypical chest pain 12/27/2014    Priority: Medium    Hyperlipidemia 02/09/2009    Priority: Medium    COPD (chronic obstructive pulmonary disease) ([place]) 02/05/2008    Priority: Medium    S/P right TKA 08/07/2016    Priority: Low   Dysphagia 11/14/2015    Priority: Low   Class 3 severe obesity due to excess calories without serious comorbidity with body mass index (BMI) of 40.0 to 44.9 in adult ([place]) 06/17/2014    Priority: Low   Kidney stones 06/17/2014    Priority: Low   Osteopenia     Priority: Low   Allergic rhinitis 05/07/2008    Priority: Low   VARICOSE VEINS LOWER EXTREMITIES W/INFLAMMATION 02/05/2008    Priority: Low   Osteoarthritis 04/24/2007    Priority: Low   Past Surgical History:  Procedure  Laterality Date   Bone spur shoulder     x2 removed   COLPOSCOPY     Neg HR HPV   KNEE ARTHROSCOPY     laser surgery per left ey     secondary to film covering    lens implants- both eyes     SPINE SURGERY  2008   lumbar   TOTAL KNEE ARTHROPLASTY Right 08/07/2016   Procedure: RIGHT TOTAL KNEE ARTHROPLASTY;  Surgeon: Paralee Cancel, MD;  Location: [facility];  Service: Orthopedics;  Laterality: Right;    Family History  Problem Relation Age of Onset   Ovarian cancer Mother    Melanoma Mother    Dementia Mother    Hypertension Father    Heart disease Father        MI 64   Heart disease Paternal Grandfather    Cancer Maternal Grandmother        Oral cancer   Down syndrome Sister    Prostate cancer Maternal Uncle    Heart disease Maternal Grandfather    Prostate cancer Maternal Uncle    Prostate cancer Maternal Uncle    Breast cancer Cousin        two maternal cousins diagnosed <50   Diabetes Paternal Aunt    Sudden Cardiac Death Son        unclear cause.    Colon  cancer Neg Hx    Esophageal cancer Neg Hx    Rectal cancer Neg Hx    Stomach cancer Neg Hx     Medications- reviewed and updated Current Outpatient Medications  Medication Sig Dispense Refill   apixaban (ELIQUIS) 5 MG TABS tablet Take 1 tablet (5 mg total) by mouth 2 (two) times daily. 60 tablet 5   Calcium Citrate 250 MG TABS Take 1,000 mg by mouth 2 (two) times daily.     Cholecalciferol (VITAMIN D3) 2000 UNITS TABS Take 2,000 Units by mouth 2 (two) times daily.     fish oil-omega-3 fatty acids 1000 MG capsule Take 2 capsules (2 g total) by mouth 2 (two) times daily. total for 4,000 mg     fluticasone (FLONASE) 50 MCG/ACT nasal spray Place 2 sprays into both nostrils daily. 16 g 3   Fluticasone Furoate (ARNUITY ELLIPTA) 100 MCG/ACT AEPB Inhale 100 mcg into the lungs daily. 30 each 11   GLUCOSAMINE SULFATE PO Take 2 tablets by mouth 2 (two) times daily.      LORATADINE PO Take 10 mg by mouth daily.     metoprolol  tartrate (LOPRESSOR) 25 MG tablet Take 1 tablet by mouth twice daily 180 tablet 2   Multiple Vitamin (MULTIVITAMIN) tablet Take 1 tablet by mouth daily.     Multiple Vitamins-Minerals (PRESERVISION AREDS) CAPS      PROAIR HFA 108 (90 Base) MCG/ACT inhaler INHALE TWO PUFFS INTO LUNGS EVERY 6 HOURS AS NEEDED FOR WHEEZING FOR SHORTNESS OF BREATH 9 each 5   rosuvastatin (CRESTOR) 10 MG tablet Take 1 tablet by mouth once daily 90 tablet 3   No current facility-administered medications for this visit.    Allergies-reviewed and updated Allergies  Allergen Reactions   Atorvastatin     Myalgia   Cefuroxime Axetil Diarrhea and Nausea And Vomiting   Other     Social History   Social History Narrative   Married 72 years in 2015. 2 kids. 2 grandkids-14 and 12 in 2020.       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 In [place], got job in [employer]. 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       Hobbies: works with grandsons in [place], Haematologist (hates housework), goes on 1 month motorcyle biking trip each year   Objective  Objective:  BP 120/80   Pulse 63   Temp (!) 97.3 F (36.3 C)   Ht 5\' 4"  (1.626 m)   Wt 259 lb 12.8 oz (117.8 kg)   SpO2 97%   BMI 44.59 kg/m  Gen: NAD, resting comfortably HEENT: Mucous membranes are moist. Oropharynx normal Neck: no thyromegaly CV: RRR no murmurs rubs or gallops Lungs: CTAB no crackles, wheeze, rhonchi Abdomen: soft/nontender/nondistended/normal bowel sounds. No rebound or guarding.  Ext: no edema Skin: warm, dry Neuro: grossly normal, moves all extremities, PERRLA   Assessment and Plan   77 y.o. female presenting for annual physical.  Health Maintenance counseling: 1. Anticipatory guidance: Patient counseled regarding regular dental exams -q6 months, eye exams -close follow up due to macular degeneration,  avoiding smoking and second hand smoke , limiting alcohol to 1 beverage per day- 2 drinks a year perhaps , no illicit drugs .   2. Risk factor reduction:  Advised patient of  need for regular exercise and diet rich and fruits and vegetables to reduce risk of heart attack and stroke.  Exercise- has been down with pool closed- considering [facility] to restart.  Morbid obesity noted with BMI over 40  Diet/weight management-weight up another 6 pounds from last year- states got as low as 243- lost a good friend in [place] and very stressful. Trying to add fruit for breakfast, pick healthier snacks.  Wt Readings from Last 3 Encounters:  12/31/22 259 lb 12.8 oz (117.8 kg)  10/24/22 255 lb 6.4 oz (115.8 kg)  09/25/22 247 lb 8 oz (112.3 kg)  3. Immunizations/screenings/ancillary studies- declines covid shot  Immunization History  Administered Date(s) Administered   Fluad Quad(high Dose 65+) 08/18/2020   Influenza Whole 08/15/2000, 08/06/2008   Influenza, High Dose Seasonal PF 10/25/2014, 07/16/2016, 07/16/2017, 08/13/2018, 07/29/2022   Influenza,inj,Quad PF,6+ Mos 08/01/2015, 07/30/2019   Influenza-Unspecified 08/15/2021   PFIZER Comirnaty(Gray Top)Covid-19 Tri-Sucrose Vaccine 04/26/2021   PFIZER(Purple Top)SARS-COV-2 Vaccination 11/19/2019, 12/10/2019, 10/26/2020   PPD Test 07/05/2014   Pfizer Covid-19 Vaccine Bivalent Booster 3yrs & up 10/04/2021   Pneumococcal Conjugate-13 10/31/2015   Pneumococcal Polysaccharide-23 08/15/2000, 06/08/2013   Td 09/14/1998, 02/05/2008   Tdap 06/16/2019   Zoster Recombinat (Shingrix) 07/10/2021, 11/10/2021   Zoster, Live 11/06/2013  4. Cervical cancer screening- sees gynecology still- mom had ovarian cancer - sees in April- past age based screening recommendations 5. Breast cancer screening-  breast exam with GYN  and mammogram -last done 01/01/22 - will be getting update soon  6. Colon cancer screening - 12/01/15 with 10 year repeat planned vs no repeat- most likely no repeat- she states she was told graduated  7. Skin cancer screening-- sees dermatology at least yearly-Dr. Renda Rolls- just seen. advised regular sunscreen use.  8. Birth  control/STD check- postmenopausal/monogomous 9. Osteoporosis screening at 54- DEXA 12/26/20 with worst T score -1.5 at right femur neck and 10-year FRAX score of 9.2% and 1.6%- takes calcium and vitamin D - offered repeat today - wants to wait until next year  -never smoker   Status of chronic or acute concerns   # Atrial fibrillation- follows with Dr. Fletcher Anon S: Rate controlled with metoprolol 25 mg twice daily Anticoagulated with Eliquis 5 mg twice daily A/P: appropriately anticoagulated and rate controlled- continue current medicine  #COPD S: Medication: arnuity ellipta 100 mcg daily.   Has albuterol inhaler and very rarely uses  -Also takes loratadine for allergies  A/P: breathing has bene much better since last flareup- continue current medications     #hyperlipidemia S: medication: Rosuvastatin 10 mg, fish oil 1000 mg daily Lab Results  Component Value Date   CHOL 141 12/27/2021   HDL 57.50 12/27/2021   LDLCALC 69 12/27/2021   LDLDIRECT 149.5 10/24/2012   TRIG 73.0 12/27/2021   CHOLHDL 2 12/27/2021  A/P:  hopefully stable- update lipid panel today. Continue current meds for now  # Hyperglycemia/insulin resistance/prediabetes-peak A1c of 5.9 S:  Medication: none Exercise and diet- see above Lab Results  Component Value Date   HGBA1C 5.9 12/27/2021   HGBA1C 5.9 06/30/2021   HGBA1C 5.9 12/23/2020   A/P: hopefully stable- update a1c today. Not interested in medicine like ozempic  Recommended follow up: Return in about 6 months (around 07/03/2023) for followup or sooner if needed.Schedule b4 you leave. Future Appointments  Date Time Provider [place]  01/09/2023  9:15 AM Hayden Pedro, MD [facility] None  02/05/2023  9:00 AM Tamela Gammon, NP [facility] None  04/05/2023 10:15 AM [facility] ANNUAL WELLNESS VISIT 1 [facility]   Lab/Order associations: fasting   ICD-10-CM   1. Preventative health care  Z00.00     2. Paroxysmal atrial fibrillation (HCC)  I48.0  3. Hyperlipidemia, unspecified hyperlipidemia type  E78.5 CBC with Differential/Platelet    Comprehensive metabolic panel    Lipid panel    4. Hyperglycemia  R73.9 HgB A1c    5. Screening for diabetes mellitus  Z13.1 HgB A1c    6. Morbid obesity ([place]) Chronic E66.01       No orders of the defined types were placed in this encounter.   Return precautions advised.  Garret Reddish, MD

## 2022-12-31 NOTE — Patient Instructions (Addendum)
Please stop by lab before you go If you have mychart- we will send your results within 3 business days of [place] receiving them.  If you do not have mychart- we will call you about results within 5 business days of [place] receiving them.  *please also note that you will see labs on mychart as soon as they post. I will later go in and write notes on them- will say "notes from Dr. Yong Channel"   Main thing we need to work on is long term gradual weight loss -consider calorie counting like myfitnesspal or weight watchers or intermittent fasting -I like your idea of getting back in pool  Recommended follow up: Return in about 6 months (around 07/03/2023) for followup or sooner if needed.Schedule b4 you leave.

## 2023-01-04 DIAGNOSIS — Z1231 Encounter for screening mammogram for malignant neoplasm of breast: Secondary | ICD-10-CM | POA: Diagnosis not present

## 2023-01-04 LAB — HM MAMMOGRAPHY

## 2023-01-09 ENCOUNTER — Encounter (INDEPENDENT_AMBULATORY_CARE_PROVIDER_SITE_OTHER): Payer: Medicare PPO | Admitting: Ophthalmology

## 2023-01-09 DIAGNOSIS — H43813 Vitreous degeneration, bilateral: Secondary | ICD-10-CM | POA: Diagnosis not present

## 2023-01-09 DIAGNOSIS — H353132 Nonexudative age-related macular degeneration, bilateral, intermediate dry stage: Secondary | ICD-10-CM | POA: Diagnosis not present

## 2023-02-05 ENCOUNTER — Ambulatory Visit (INDEPENDENT_AMBULATORY_CARE_PROVIDER_SITE_OTHER): Payer: Medicare PPO | Admitting: Nurse Practitioner

## 2023-02-05 ENCOUNTER — Encounter: Payer: Self-pay | Admitting: Nurse Practitioner

## 2023-02-05 VITALS — BP 118/78 | HR 76 | Ht 63.75 in | Wt 258.0 lb

## 2023-02-05 DIAGNOSIS — Z780 Asymptomatic menopausal state: Secondary | ICD-10-CM

## 2023-02-05 DIAGNOSIS — R102 Pelvic and perineal pain: Secondary | ICD-10-CM

## 2023-02-05 DIAGNOSIS — Z8041 Family history of malignant neoplasm of ovary: Secondary | ICD-10-CM

## 2023-02-05 DIAGNOSIS — M8589 Other specified disorders of bone density and structure, multiple sites: Secondary | ICD-10-CM

## 2023-02-05 DIAGNOSIS — Z01419 Encounter for gynecological examination (general) (routine) without abnormal findings: Secondary | ICD-10-CM | POA: Diagnosis not present

## 2023-02-05 NOTE — Progress Notes (Signed)
[name] 76-Feb-1947 [id number]   History:  77 y.o. G2P2002 presents for breast and pelvic exam. Postmenopausal - no HRT, no bleeding. Normal pap and mammogram history. H/O a.fib - on eliquis, COPD. Osteopenia managed by PCP. Complains of intermittent lower abdominal pain mostly with working out. Mother with history of ovarian cancer. Normal pelvic ultrasound 01/2021 for evaluation of bloating and pain.   Gynecologic History No LMP recorded. Patient is postmenopausal.   Contraception/Family planning: post menopausal status Sexually active: No  Health Maintenance Last Pap: 07/08/2013. Results were: Normal Last mammogram: 01/04/2023. Results were: Normal Last colonoscopy: 12/01/2015. 10-year recall Last Dexa: 12/26/2020. Results were: T-score -1.5, FRAX 9.2% / 1.6%  Past medical history, past surgical history, family history and social history were all reviewed and documented in the EPIC chart.  Married. Retired Optometrist. Daughter, lives close, 3 children ages 41, 4 and 81. Mother deceased from ovarian cancer.  ROS:  A ROS was performed and pertinent positives and negatives are included.  Exam:  Vitals:   02/05/23 0905  BP: 118/78  Pulse: 76  SpO2: 96%  Weight: 258 lb (117 kg)  Height: 5' 3.75" (1.619 m)    Body mass index is 44.63 kg/m.  General appearance:  Normal Thyroid:  Symmetrical, normal in size, without palpable masses or nodularity. Respiratory  Auscultation:  Clear without wheezing or rhonchi Cardiovascular  Auscultation:  Regular rate, without rubs, murmurs or gallops  Edema/varicosities:  Not grossly evident Abdominal  Soft,nontender, without masses, guarding or rebound.  Liver/spleen:  No organomegaly noted  Hernia:  None appreciated  Skin  Inspection:  Grossly normal   Breasts: Examined lying and sitting.   Right: Without masses, retractions, discharge or axillary adenopathy.   Left: Without masses, retractions, discharge or axillary  adenopathy. Genitourinary   Inguinal/mons:  Normal without inguinal adenopathy  External genitalia:  Normal appearing vulva with no masses, tenderness, or lesions  BUS/Urethra/Skene's glands:  Normal  Vagina:  Normal appearing with normal color and discharge, no lesions  Cervix:  Normal appearing without discharge or lesions  Uterus:  Normal in size, shape and contour.  Midline and mobile, nontender  Adnexa/parametria:     Rt: Normal in size, without masses or tenderness.   Lt: Normal in size, without masses or tenderness.  Anus and perineum: Normal  Digital rectal exam: Deferred  Patient informed chaperone available to be present for breast and pelvic exam. Patient has requested no chaperone to be present. Patient has been advised what will be completed during breast and pelvic exam.   Assessment/Plan:  77 y.o. G2P2002 for annual exam.   Encounter for breast and pelvic examination - Education provided on SBEs, importance of preventative screenings, current guidelines, high calcium diet, regular exercise, and multivitamin daily. Labs with PCP.   Osteopenia, unspecified location - T-score -1.17 December 2020 without elevated FRAX. Managed by PCP. Continue daily Vitamin D supplement and regular exercise.   Postmenopausal - no HRT, no bleeding  Pelvic pain - Plan: US PELVIS TRANSVAGINAL NON-OB (TV ONLY). Would like ultrasound due to family history.   Family history of ovarian cancer - Plan: US PELVIS TRANSVAGINAL NON-OB (TV ONLY). Mother deceased from ovarian cancer. Patient having intermittent pelvic pain.   Screening for cervical cancer - Normal Pap history. No longer screening per guidelines.   Screening for breast cancer - Normal mammogram history.  Continue annual screenings.  Normal breast exam today.  Screening for colon cancer - 2017 colonoscopy. No longer screening per GI recommendations.  Return in 2 years for breast and pelvic exam.      Olivia Mackie DNP, 11:10 AM  02/05/2023

## 2023-02-07 ENCOUNTER — Ambulatory Visit (INDEPENDENT_AMBULATORY_CARE_PROVIDER_SITE_OTHER): Payer: Medicare PPO | Admitting: Nurse Practitioner

## 2023-02-07 ENCOUNTER — Ambulatory Visit (INDEPENDENT_AMBULATORY_CARE_PROVIDER_SITE_OTHER): Payer: Medicare PPO

## 2023-02-07 VITALS — BP 118/82 | HR 73

## 2023-02-07 DIAGNOSIS — R102 Pelvic and perineal pain: Secondary | ICD-10-CM | POA: Diagnosis not present

## 2023-02-07 DIAGNOSIS — Z8041 Family history of malignant neoplasm of ovary: Secondary | ICD-10-CM | POA: Diagnosis not present

## 2023-02-07 NOTE — Progress Notes (Signed)
   Acute Office Visit  Subjective:    Patient ID: [name], female    DOB: [birth year], 77 y.o.   MRN: [id number]   HPI 77 y.o. presents today for ultrasound. Seen 02/05/2023 for annual with complaints of intermittent lower abdominal pain mostly with working out. Mother with history of ovarian cancer so she is worried.   No LMP recorded. Patient is postmenopausal.    Review of Systems  Constitutional: Negative.   Gastrointestinal:  Positive for abdominal pain.       Objective:    Physical Exam Constitutional:      Appearance: Normal appearance.   GU: Not indicated  BP 118/82   Pulse 73   SpO2 91%  Wt Readings from Last 3 Encounters:  02/05/23 258 lb (117 kg)  12/31/22 259 lb 12.8 oz (117.8 kg)  10/24/22 255 lb 6.4 oz (115.8 kg)       Assessment & Plan:   Problem List Items Addressed This Visit   None Visit Diagnoses     Pelvic pain    -  Primary   Family history of ovarian cancer          Vaginal ultrasound: Anteverted uterus, atrophic size and shape, no myometrial masses.  Thin, symmetrical endometrium - 1 mm.  No masses or thickening seen.  Bilateral ovaries not seen.  No adnexal masses, no free fluid.  Plan: Normal ultrasound reviewed with patient. Pain likely musculoskeletal in nature. Will address with PCP or ortho.      Olivia Mackie DNP, 4:24 PM 02/07/2023

## 2023-03-18 ENCOUNTER — Ambulatory Visit (INDEPENDENT_AMBULATORY_CARE_PROVIDER_SITE_OTHER): Payer: Medicare PPO

## 2023-03-18 VITALS — Wt 258.0 lb

## 2023-03-18 DIAGNOSIS — Z0000 Encounter for general adult medical examination without abnormal findings: Secondary | ICD-10-CM

## 2023-03-18 NOTE — Patient Instructions (Signed)
[name] , Thank you for taking time to come for your Medicare Wellness Visit. I appreciate your ongoing commitment to your health goals. Please review the following plan we discussed and let me know if I can assist you in the future.   These are the goals we discussed:  Goals      Patient Stated     Lose weight      Patient Stated     Lose weight      Patient Stated     Lose weight      Weight (lb) < 200 lb (90.7 kg)     Stop eating bread and white sugar  Paleo x 3 to 4 days days to wean from sugar  Keep drinking your water !  May try to watch calories x 7 days and weight every day You can use myfitnesspal or calorie king.com           This is a list of the screening recommended for you and due dates:  Health Maintenance  Topic Date Due   COVID-19 Vaccine (6 - 2023-24 season) 10/09/2023*   Flu Shot  05/16/2023   Medicare Annual Wellness Visit  03/17/2024   DTaP/Tdap/Td vaccine (4 - Td or Tdap) 06/15/2029   Pneumonia Vaccine  Completed   DEXA scan (bone density measurement)  Completed   Hepatitis C Screening  Completed   Zoster (Shingles) Vaccine  Completed   HPV Vaccine  Aged Out   Colon Cancer Screening  Discontinued  *Topic was postponed. The date shown is not the original due date.    Advanced directives: Advance directive discussed with you today. Even though you declined this today please call our office should you change your mind and we can give you the proper paperwork for you to fill out.  Conditions/risks identified: lose weight   Next appointment: Follow up in one year for your annual wellness visit    Preventive Care 65 Years and Older, Female Preventive care refers to lifestyle choices and visits with your health care provider that can promote health and wellness. What does preventive care include? A yearly physical exam. This is also called an annual well check. Dental exams once or twice a year. Routine eye exams. Ask your health care  provider how often you should have your eyes checked. Personal lifestyle choices, including: Daily care of your teeth and gums. Regular physical activity. Eating a healthy diet. Avoiding tobacco and drug use. Limiting alcohol use. Practicing safe sex. Taking low-dose aspirin every day. Taking vitamin and mineral supplements as recommended by your health care provider. What happens during an annual well check? The services and screenings done by your health care provider during your annual well check will depend on your age, overall health, lifestyle risk factors, and family history of disease. Counseling  Your health care provider may ask you questions about your: Alcohol use. Tobacco use. Drug use. Emotional well-being. Home and relationship well-being. Sexual activity. Eating habits. History of falls. Memory and ability to understand (cognition). Work and work Astronomer. Reproductive health. Screening  You may have the following tests or measurements: Height, weight, and BMI. Blood pressure. Lipid and cholesterol levels. These may be checked every 5 years, or more frequently if you are over 54 years old. Skin check. Lung cancer screening. You may have this screening every year starting at age 71 if you have a 30-pack-year history of smoking and currently smoke or have quit within the past 15 years. Fecal occult  blood test (FOBT) of the stool. You may have this test every year starting at age 75. Flexible sigmoidoscopy or colonoscopy. You may have a sigmoidoscopy every 5 years or a colonoscopy every 10 years starting at age 43. Hepatitis C blood test. Hepatitis B blood test. Sexually transmitted disease (STD) testing. Diabetes screening. This is done by checking your blood sugar (glucose) after you have not eaten for a while (fasting). You may have this done every 1-3 years. Bone density scan. This is done to screen for osteoporosis. You may have this done starting at age  57. Mammogram. This may be done every 1-2 years. Talk to your health care provider about how often you should have regular mammograms. Talk with your health care provider about your test results, treatment options, and if necessary, the need for more tests. Vaccines  Your health care provider may recommend certain vaccines, such as: Influenza vaccine. This is recommended every year. Tetanus, diphtheria, and acellular pertussis (Tdap, Td) vaccine. You may need a Td booster every 10 years. Zoster vaccine. You may need this after age 47. Pneumococcal 13-valent conjugate (PCV13) vaccine. One dose is recommended after age 66. Pneumococcal polysaccharide (PPSV23) vaccine. One dose is recommended after age 59. Talk to your health care provider about which screenings and vaccines you need and how often you need them. This information is not intended to replace advice given to you by your health care provider. Make sure you discuss any questions you have with your health care provider. Document Released: 10/28/2015 Document Revised: 06/20/2016 Document Reviewed: 08/02/2015 Elsevier Interactive Patient Education  2017 [employer].  Fall Prevention in the Home Falls can cause injuries. They can happen to people of all ages. There are many things you can do to make your home safe and to help prevent falls. What can I do on the outside of my home? Regularly fix the edges of walkways and driveways and fix any cracks. Remove anything that might make you trip as you walk through a door, such as a raised step or threshold. Trim any bushes or trees on the path to your home. Use bright outdoor lighting. Clear any walking paths of anything that might make someone trip, such as rocks or tools. Regularly check to see if handrails are loose or broken. Make sure that both sides of any steps have handrails. Any raised decks and porches should have guardrails on the edges. Have any leaves, snow, or ice cleared  regularly. Use sand or salt on walking paths during winter. Clean up any spills in your garage right away. This includes oil or grease spills. What can I do in the bathroom? Use night lights. Install grab bars by the toilet and in the tub and shower. Do not use towel bars as grab bars. Use non-skid mats or decals in the tub or shower. If you need to sit down in the shower, use a plastic, non-slip stool. Keep the floor dry. Clean up any water that spills on the floor as soon as it happens. Remove soap buildup in the tub or shower regularly. Attach bath mats securely with double-sided non-slip rug tape. Do not have throw rugs and other things on the floor that can make you trip. What can I do in the bedroom? Use night lights. Make sure that you have a light by your bed that is easy to reach. Do not use any sheets or blankets that are too big for your bed. They should not hang down onto the floor.  Have a firm chair that has side arms. You can use this for support while you get dressed. Do not have throw rugs and other things on the floor that can make you trip. What can I do in the kitchen? Clean up any spills right away. Avoid walking on wet floors. Keep items that you use a lot in easy-to-reach places. If you need to reach something above you, use a strong step stool that has a grab bar. Keep electrical cords out of the way. Do not use floor polish or wax that makes floors slippery. If you must use wax, use non-skid floor wax. Do not have throw rugs and other things on the floor that can make you trip. What can I do with my stairs? Do not leave any items on the stairs. Make sure that there are handrails on both sides of the stairs and use them. Fix handrails that are broken or loose. Make sure that handrails are as long as the stairways. Check any carpeting to make sure that it is firmly attached to the stairs. Fix any carpet that is loose or worn. Avoid having throw rugs at the top or  bottom of the stairs. If you do have throw rugs, attach them to the floor with carpet tape. Make sure that you have a light switch at the top of the stairs and the bottom of the stairs. If you do not have them, ask someone to add them for you. What else can I do to help prevent falls? Wear shoes that: Do not have high heels. Have rubber bottoms. Are comfortable and fit you well. Are closed at the toe. Do not wear sandals. If you use a stepladder: Make sure that it is fully opened. Do not climb a closed stepladder. Make sure that both sides of the stepladder are locked into place. Ask someone to hold it for you, if possible. Clearly mark and make sure that you can see: Any grab bars or handrails. First and last steps. Where the edge of each step is. Use tools that help you move around (mobility aids) if they are needed. These include: Canes. Walkers. Scooters. Crutches. Turn on the lights when you go into a dark area. Replace any light bulbs as soon as they burn out. Set up your furniture so you have a clear path. Avoid moving your furniture around. If any of your floors are uneven, fix them. If there are any pets around you, be aware of where they are. Review your medicines with your doctor. Some medicines can make you feel dizzy. This can increase your chance of falling. Ask your doctor what other things that you can do to help prevent falls. This information is not intended to replace advice given to you by your health care provider. Make sure you discuss any questions you have with your health care provider. Document Released: 07/28/2009 Document Revised: 03/08/2016 Document Reviewed: 11/05/2014 Elsevier Interactive Patient Education  2017 [employer].

## 2023-03-18 NOTE — Progress Notes (Signed)
I connected with  [name] on 03/18/23 by a audio enabled telemedicine application and verified that I am speaking with the correct person using two identifiers.  Patient Location: Home  Provider Location: Office/Clinic  I discussed the limitations of evaluation and management by telemedicine. The patient expressed understanding and agreed to proceed.   Subjective:   [name] is a 77 y.o. female who presents for Medicare Annual (Subsequent) preventive examination.  Review of Systems     Cardiac Risk Factors include: advanced age (>4men, >14 women);dyslipidemia;obesity (BMI >30kg/m2)     Objective:    Today's Vitals   03/18/23 1556  Weight: 258 lb (117 kg)   Body mass index is 44.63 kg/m.     03/18/2023    4:02 PM 03/29/2022   10:08 AM 03/23/2021   10:27 AM 12/22/2019   10:12 AM 10/31/2017    8:36 AM 08/07/2016    2:43 PM 08/07/2016    9:58 AM  Advanced Directives  Does Patient Have a Medical Advance Directive? No No No No No No No  Would patient like information on creating a medical advance directive? No - Patient declined No - Patient declined Yes (MAU/Ambulatory/Procedural Areas - Information given) No - Patient declined  No - patient declined information No - patient declined information    Current Medications (verified) Outpatient Encounter Medications as of 03/18/2023  Medication Sig   apixaban (ELIQUIS) 5 MG TABS tablet Take 1 tablet (5 mg total) by mouth 2 (two) times daily.   Calcium Citrate 250 MG TABS Take 1,000 mg by mouth 2 (two) times daily.   Cholecalciferol (VITAMIN D3) 2000 UNITS TABS Take 2,000 Units by mouth 2 (two) times daily.   fish oil-omega-3 fatty acids 1000 MG capsule Take 2 capsules (2 g total) by mouth 2 (two) times daily. total for 4,000 mg   fluticasone (FLONASE) 50 MCG/ACT nasal spray Place 2 sprays into both nostrils daily.   Fluticasone Furoate (ARNUITY ELLIPTA) 100 MCG/ACT AEPB Inhale 100 mcg into the lungs daily.   GLUCOSAMINE SULFATE  PO Take 2 tablets by mouth 2 (two) times daily.    LORATADINE PO Take 10 mg by mouth daily.   metoprolol tartrate (LOPRESSOR) 25 MG tablet Take 1 tablet by mouth twice daily   Multiple Vitamin (MULTIVITAMIN) tablet Take 1 tablet by mouth daily.   Multiple Vitamins-Minerals (PRESERVISION AREDS) CAPS    PROAIR HFA 108 (90 Base) MCG/ACT inhaler INHALE TWO PUFFS INTO LUNGS EVERY 6 HOURS AS NEEDED FOR WHEEZING FOR SHORTNESS OF BREATH   rosuvastatin (CRESTOR) 10 MG tablet Take 1 tablet by mouth once daily   No facility-administered encounter medications on file as of 03/18/2023.    Allergies (verified) Atorvastatin, Cefuroxime axetil, and Other   History: Past Medical History:  Diagnosis Date   Allergy    ASCUS on Pap smear 2007   ASTHMA UNSPECIFIED WITH EXACERBATION 11/08/2008   Atrial fibrillation (HCC)    Cancer of skin of left ear    Chest pain 03/2011   negative stress test - Dr. Myrtis Ser   History of frequent urinary tract infections    History of kidney stones    HYPERLIPIDEMIA, BORDERLINE 02/09/2009   Kidney stones    OSTEOARTHRITIS 04/24/2007   Osteopenia    Osteoporosis    Pneumonia    PONV (postoperative nausea and vomiting)    SHINGLES 11/08/2008   had vaccine at drugstore 12/14   VARICOSE VEINS LOWER EXTREMITIES W/INFLAMMATION 02/05/2008   Past Surgical History:  Procedure  Laterality Date   Bone spur shoulder     x2 removed   COLPOSCOPY     Neg HR HPV   KNEE ARTHROSCOPY     laser surgery per left ey     secondary to film covering    lens implants- both eyes     SPINE SURGERY  2008   lumbar   TOTAL KNEE ARTHROPLASTY Right 08/07/2016   Procedure: RIGHT TOTAL KNEE ARTHROPLASTY;  Surgeon: Durene Romans, MD;  Location: [facility];  Service: Orthopedics;  Laterality: Right;   Family History  Problem Relation Age of Onset   Ovarian cancer Mother    Melanoma Mother    Dementia Mother    Hypertension Father    Heart disease Father        MI 36   Heart disease Paternal  Grandfather    Cancer Maternal Grandmother        Oral cancer   Down syndrome Sister    Prostate cancer Maternal Uncle    Heart disease Maternal Grandfather    Prostate cancer Maternal Uncle    Prostate cancer Maternal Uncle    Breast cancer Cousin        two maternal cousins diagnosed <50   Diabetes Paternal Aunt    Sudden Cardiac Death Son        unclear cause.    Colon cancer Neg Hx    Esophageal cancer Neg Hx    Rectal cancer Neg Hx    Stomach cancer Neg Hx    Social History   Socioeconomic History   Marital status: Married    Spouse name: Not on file   Number of children: 2   Years of education: Not on file   Highest education level: Not on file  Occupational History   Occupation: Retired    Comment: Runner, broadcasting/film/video   Tobacco Use   Smoking status: Never    Passive exposure: Yes   Smokeless tobacco: Never   Tobacco comments:    last exposure 1999  Vaping Use   Vaping Use: Never used  Substance and Sexual Activity   Alcohol use: Yes    Alcohol/week: 1.0 standard drink of alcohol    Types: 1 Standard drinks or equivalent per week    Comment: monthly   Drug use: No   Sexual activity: Not Currently    Birth control/protection: Post-menopausal    Comment: First IC >68 y/o, <5 Partners, No hx of STIs, No DES exposure  Other Topics Concern   Not on file  Social History Narrative   Married 44 years in 2015. 2 kids. 2 grandkids-14 and 12 in 2020.       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, Presenter, broadcasting (hates housework), goes on 1 month motorcyle biking trip each year   Social Determinants of Health   Financial Resource Strain: Low Risk  (03/18/2023)   Overall Financial Resource Strain (CARDIA)    Difficulty of Paying Living Expenses: Not hard at all  Food Insecurity: No Food Insecurity (03/18/2023)   Hunger Vital Sign    Worried About Running Out of Food in the Last Year: Never true    Ran  Out of Food in the Last Year: Never true  Transportation Needs: No Transportation Needs (03/18/2023)   PRAPARE - Administrator, Civil Service (Medical): No    Lack of Transportation (Non-Medical): No  Physical Activity: Insufficiently Active (03/18/2023)   Exercise Vital Sign    Days of Exercise per Week: 2 days    Minutes of Exercise per Session: 50 min  Stress: No Stress Concern Present (03/18/2023)   [employer] of Occupational Health - Occupational Stress Questionnaire    Feeling of Stress : Not at all  Social Connections: Moderately Integrated (03/18/2023)   Social Connection and Isolation Panel [NHANES]    Frequency of Communication with Friends and Family: More than three times a week    Frequency of Social Gatherings with Friends and Family: More than three times a week    Attends Religious Services: 1 to 4 times per year    Active Member of [employer] or Organizations: No    Attends Engineer, structural: Never    Marital Status: Married    Tobacco Counseling Counseling given: Not Answered Tobacco comments: last exposure 1999   Clinical Intake:  Pre-visit preparation completed: Yes  Pain : No/denies pain     BMI - recorded: 44.63 Nutritional Status: BMI > 30  Obese Nutritional Risks: None Diabetes: No  How often do you need to have someone help you when you read instructions, pamphlets, or other written materials from your doctor or pharmacy?: 1 - Never  Diabetic?no  Interpreter Needed?: No  Information entered by :: Lanier Ensign, LPN   Activities of Daily Living    03/18/2023    4:04 PM 03/29/2022   10:10 AM  In your present state of health, do you have any difficulty performing the following activities:  Hearing? 0 0  Comment  have tinnitus  Vision? 1 0  Comment macular degeneration   Difficulty concentrating or making decisions? 0 0  Walking or climbing stairs? 0 0  Dressing or bathing? 0 0  Doing errands, shopping? 0 0  Preparing  Food and eating ? N N  Using the Toilet? N N  In the past six months, have you accidently leaked urine? N N  Do you have problems with loss of bowel control? N N  Managing your Medications? N N  Managing your Finances? N N  Housekeeping or managing your Housekeeping? N N    Patient Care Team: Shelva Majestic, MD as PCP - General (Family Medicine) [place] Ouch, MD as PCP - Cardiology (Cardiology) [place] Ouch, MD as Consulting Physician (Cardiology)  Indicate any recent Medical Services you may have received from other than [facility] providers in the past year (date may be approximate).     Assessment:   This is a routine wellness examination for Kanyah.  Hearing/Vision screen Hearing Screening - Comments:: Pt denies any hearing issues  Vision Screening - Comments:: Pt follows up with Dr Ashley Royalty for annual ey exams and [facility]   Dietary issues and exercise activities discussed: Current Exercise Habits: Home exercise routine, Type of exercise: Other - see comments (pool), Time (Minutes): 60, Frequency (Times/Week): 2, Weekly Exercise (Minutes/Week): 120   Goals Addressed             This Visit's Progress    Patient Stated       Lose weight        Depression Screen    03/18/2023    4:00 PM 12/31/2022    9:37 AM 03/29/2022   10:06 AM 06/30/2021    2:14 PM 03/23/2021   10:23 AM 12/23/2020    8:16 AM 12/22/2019   10:13 AM  PHQ 2/9 Scores  PHQ - 2 Score 0 0 0  0 0 0 0  PHQ- 9 Score  2         Fall Risk    03/18/2023    4:03 PM 12/31/2022    9:37 AM 03/29/2022   10:10 AM 06/30/2021    2:13 PM 03/23/2021   10:29 AM  Fall Risk   Falls in the past year? 1 1 0 0 1  Number falls in past yr:  1 0 0 1  Injury with Fall? 0 0 0 0 1  Comment slipped in mud    scrape knees  Risk for fall due to : Impaired vision History of fall(s) Impaired vision;Impaired balance/gait No Fall Risks Impaired vision  Follow up Falls prevention discussed Falls evaluation completed Falls prevention  discussed Falls evaluation completed Falls prevention discussed    FALL RISK PREVENTION PERTAINING TO THE HOME:  Any stairs in or around the home? Yes  If so, are there any without handrails? No  Home free of loose throw rugs in walkways, pet beds, electrical cords, etc? Yes  Adequate lighting in your home to reduce risk of falls? Yes   ASSISTIVE DEVICES UTILIZED TO PREVENT FALLS:  Life alert? No  Use of a cane, walker or w/c? No  Grab bars in the bathroom? Yes  Shower chair or bench in shower? Yes  Elevated toilet seat or a handicapped toilet? No   TIMED UP AND GO:  Was the test performed? No .   Cognitive Function:        03/18/2023    4:04 PM 03/29/2022   10:12 AM 03/23/2021   10:31 AM 12/22/2019   10:13 AM  6CIT Screen  What Year? 0 points 0 points 0 points 0 points  What month? 0 points 0 points 0 points 0 points  What time? 0 points 0 points 0 points 0 points  Count back from 20 0 points 0 points 0 points 0 points  Months in reverse 0 points 0 points 0 points 0 points  Repeat phrase 0 points 0 points 0 points 0 points  Total Score 0 points 0 points 0 points 0 points    Immunizations Immunization History  Administered Date(s) Administered   Fluad Quad(high Dose 65+) 08/18/2020   Influenza Whole 08/15/2000, 08/06/2008   Influenza, High Dose Seasonal PF 10/25/2014, 07/16/2016, 07/16/2017, 08/13/2018, 07/29/2022   Influenza,inj,Quad PF,6+ Mos 08/01/2015, 07/30/2019   Influenza-Unspecified 08/15/2021   PFIZER Comirnaty(Gray Top)Covid-19 Tri-Sucrose Vaccine 04/26/2021   PFIZER(Purple Top)SARS-COV-2 Vaccination 11/19/2019, 12/10/2019, 10/26/2020   PPD Test 07/05/2014   Pfizer Covid-19 Vaccine Bivalent Booster 79yrs & up 10/04/2021   Pneumococcal Conjugate-13 10/31/2015   Pneumococcal Polysaccharide-23 08/15/2000, 06/08/2013   Td 09/14/1998, 02/05/2008   Tdap 06/16/2019   Zoster Recombinat (Shingrix) 07/10/2021, 11/10/2021   Zoster, Live 11/06/2013    TDAP status:  Up to date  Flu Vaccine status: Up to date  Pneumococcal vaccine status: Up to date  Covid-19 vaccine status: Completed vaccines  Qualifies for Shingles Vaccine? Yes   Zostavax completed Yes   Shingrix Completed?: Yes  Screening Tests Health Maintenance  Topic Date Due   COVID-19 Vaccine (6 - 2023-24 season) 10/09/2023 (Originally 06/15/2022)   INFLUENZA VACCINE  05/16/2023   Medicare Annual Wellness (AWV)  03/17/2024   DTaP/Tdap/Td (4 - Td or Tdap) 06/15/2029   Pneumonia Vaccine 66+ Years old  Completed   DEXA SCAN  Completed   Hepatitis C Screening  Completed   Zoster Vaccines- Shingrix  Completed   HPV VACCINES  Aged Out   Colonoscopy  Discontinued    Health Maintenance  There are no preventive care reminders to display for this patient.   Colorectal cancer screening: No longer required.   Mammogram status: Completed 01/04/23. Repeat every year  Bone Density status: Completed 12/26/20. Results reflect: Bone density results: OSTEOPENIA. Repeat every 2 years.  Additional Screening:  Hepatitis C Screening:  Completed 10/26/15  Vision Screening: Recommended annual ophthalmology exams for early detection of glaucoma and other disorders of the eye. Is the patient up to date with their annual eye exam?  Yes  Who is the provider or what is the name of the office in which the patient attends annual eye exams? [facility] and Dr Ashley Royalty  If pt is not established with a provider, would they like to be referred to a provider to establish care? No .   Dental Screening: Recommended annual dental exams for proper oral hygiene  Community Resource Referral / Chronic Care Management: CRR required this visit?  No   CCM required this visit?  No      Plan:     I have personally reviewed and noted the following in the patient's chart:   Medical and social history Use of alcohol, tobacco or illicit drugs  Current medications and supplements including opioid prescriptions. Patient  is not currently taking opioid prescriptions. Functional ability and status Nutritional status Physical activity Advanced directives List of other physicians Hospitalizations, surgeries, and ER visits in previous 12 months Vitals Screenings to include cognitive, depression, and falls Referrals and appointments  In addition, I have reviewed and discussed with patient certain preventive protocols, quality metrics, and best practice recommendations. A written personalized care plan for preventive services as well as general preventive health recommendations were provided to patient.     Marzella Schlein, LPN   [id number]/9811   Nurse Notes: none

## 2023-03-25 DIAGNOSIS — I2510 Atherosclerotic heart disease of native coronary artery without angina pectoris: Secondary | ICD-10-CM | POA: Diagnosis not present

## 2023-03-25 DIAGNOSIS — H3530 Unspecified macular degeneration: Secondary | ICD-10-CM | POA: Diagnosis not present

## 2023-03-25 DIAGNOSIS — I739 Peripheral vascular disease, unspecified: Secondary | ICD-10-CM | POA: Diagnosis not present

## 2023-03-25 DIAGNOSIS — I10 Essential (primary) hypertension: Secondary | ICD-10-CM | POA: Diagnosis not present

## 2023-03-25 DIAGNOSIS — E785 Hyperlipidemia, unspecified: Secondary | ICD-10-CM | POA: Diagnosis not present

## 2023-03-25 DIAGNOSIS — M1990 Unspecified osteoarthritis, unspecified site: Secondary | ICD-10-CM | POA: Diagnosis not present

## 2023-03-25 DIAGNOSIS — J309 Allergic rhinitis, unspecified: Secondary | ICD-10-CM | POA: Diagnosis not present

## 2023-03-25 DIAGNOSIS — M8580 Other specified disorders of bone density and structure, unspecified site: Secondary | ICD-10-CM | POA: Diagnosis not present

## 2023-03-25 DIAGNOSIS — I4891 Unspecified atrial fibrillation: Secondary | ICD-10-CM | POA: Diagnosis not present

## 2023-03-26 ENCOUNTER — Ambulatory Visit: Payer: Medicare PPO | Admitting: Family Medicine

## 2023-03-26 ENCOUNTER — Encounter: Payer: Self-pay | Admitting: Family Medicine

## 2023-03-26 VITALS — BP 138/70 | HR 63 | Temp 98.0°F | Ht 63.75 in | Wt 255.4 lb

## 2023-03-26 DIAGNOSIS — R7303 Prediabetes: Secondary | ICD-10-CM | POA: Diagnosis not present

## 2023-03-26 DIAGNOSIS — R739 Hyperglycemia, unspecified: Secondary | ICD-10-CM | POA: Diagnosis not present

## 2023-03-26 DIAGNOSIS — I480 Paroxysmal atrial fibrillation: Secondary | ICD-10-CM

## 2023-03-26 DIAGNOSIS — E785 Hyperlipidemia, unspecified: Secondary | ICD-10-CM

## 2023-03-26 DIAGNOSIS — R0989 Other specified symptoms and signs involving the circulatory and respiratory systems: Secondary | ICD-10-CM | POA: Diagnosis not present

## 2023-03-26 NOTE — Patient Instructions (Addendum)
We have placed a referral for you today for ankle-brachial index/blood flow study of the legs. In some cases you will see # listed below- you can call this if you have not heard within a week. If you do not see # listed- you should receive a mychart message or phone call within a week with the # to call. Reach out to [place] if you are not scheduled within 2 weeks  Recommended follow up: Return for next already scheduled visit or sooner if needed.

## 2023-03-26 NOTE — Progress Notes (Signed)
Phone [phone] In person visit   Subjective:   [name] is a 77 y.o. year old very pleasant female patient who presents for/with See problem oriented charting Chief Complaint  Patient presents with   Referral    Pt would like to discuss referral to vascular due to decreased blood flow in bilateral legs diagnosed by insurance nurse   discuss med    Pt would like to discuss Ozempic    Past Medical History-  Patient Active Problem List   Diagnosis Date Noted   Paroxysmal atrial fibrillation (HCC) 12/11/2017    Priority: High   Hyperglycemia 12/31/2022    Priority: Medium    Macular degeneration 12/22/2019    Priority: Medium    Atypical chest pain 12/27/2014    Priority: Medium    Hyperlipidemia 02/09/2009    Priority: Medium    COPD (chronic obstructive pulmonary disease) (HCC) 02/05/2008    Priority: Medium    S/P right TKA 08/07/2016    Priority: Low   Dysphagia 11/14/2015    Priority: Low   Class 3 severe obesity due to excess calories without serious comorbidity with body mass index (BMI) of 40.0 to 44.9 in adult (HCC) 06/17/2014    Priority: Low   Kidney stones 06/17/2014    Priority: Low   Osteopenia     Priority: Low   Allergic rhinitis 05/07/2008    Priority: Low   VARICOSE VEINS LOWER EXTREMITIES W/INFLAMMATION 02/05/2008    Priority: Low   Osteoarthritis 04/24/2007    Priority: Low    Medications- reviewed and updated Current Outpatient Medications  Medication Sig Dispense Refill   apixaban (ELIQUIS) 5 MG TABS tablet Take 1 tablet (5 mg total) by mouth 2 (two) times daily. 60 tablet 5   Calcium Citrate 250 MG TABS Take 1,000 mg by mouth 2 (two) times daily.     Cholecalciferol (VITAMIN D3) 2000 UNITS TABS Take 2,000 Units by mouth 2 (two) times daily.     fish oil-omega-3 fatty acids 1000 MG capsule Take 2 capsules (2 g total) by mouth 2 (two) times daily. total for 4,000 mg     fluticasone (FLONASE) 50 MCG/ACT nasal spray Place 2 sprays into  both nostrils daily. 16 g 3   Fluticasone Furoate (ARNUITY ELLIPTA) 100 MCG/ACT AEPB Inhale 100 mcg into the lungs daily. 30 each 11   GLUCOSAMINE SULFATE PO Take 2 tablets by mouth 2 (two) times daily.      LORATADINE PO Take 10 mg by mouth daily.     metoprolol tartrate (LOPRESSOR) 25 MG tablet Take 1 tablet by mouth twice daily 180 tablet 2   Multiple Vitamin (MULTIVITAMIN) tablet Take 1 tablet by mouth daily.     Multiple Vitamins-Minerals (PRESERVISION AREDS) CAPS      PROAIR HFA 108 (90 Base) MCG/ACT inhaler INHALE TWO PUFFS INTO LUNGS EVERY 6 HOURS AS NEEDED FOR WHEEZING FOR SHORTNESS OF BREATH 9 each 5   rosuvastatin (CRESTOR) 10 MG tablet Take 1 tablet by mouth once daily 90 tablet 3   No current facility-administered medications for this visit.     Objective:  BP 138/70   Pulse 63   Temp 98 F (36.7 C)   Ht 5' 3.75" (1.619 m)   Wt 255 lb 6.4 oz (115.8 kg)   SpO2 99%   BMI 44.18 kg/m  Gen: NAD, resting comfortably CV: RRR no murmurs rubs or gallops Lungs: CTAB no crackles, wheeze, rhonchi Ext: trace nonpitting edema, 1+ DP and physical therapy pulses.  Varicose veins noted Skin: warm, dry.    Assessment and Plan    # Atrial fibrillation- follows with Dr. Kirke Corin S: Rate controlled with metoprolol 25 mg twice daily Anticoagulated with Eliquis 5 mg twice daily A/P: appropriately anticoagulated and rate controlled- continue current medicine   # Concern for PAD-patient recently with insurance nurse visit who suggested decreased blood flow in bilateral legs based on study done off her toes - listed as severely decreased on right foot. No claudication #hyperlipidemia S: medication: Rosuvastatin 10 mg, fish oil 1000 mg daily Lab Results  Component Value Date   CHOL 140 12/31/2022   HDL 56.50 12/31/2022   LDLCALC 64 12/31/2022   LDLDIRECT 149.5 10/24/2012   TRIG 99.0 12/31/2022   CHOLHDL 2 12/31/2022  A/P: possible peripheral arterial disease - does have 1+ PT and DP  pulses - and had recent home study concerning for decreased blood flow- we will get ankle-brachial index tests -thankfully cholesterol has been very well controlled on most recent testing and no known diabetes. Continue current medications for lipids with LDL under 70 -does have varicose veins but discussed venous and arterial issues are different- thankfully on pain from either -may refer to vascular per her request if significant peripheral arterial disease   # Hyperglycemia/insulin resistance/prediabetes-peak A1c of 5.9 S:  Medication: none but patient interested in Ozempic Exercise and diet- down 5 lbs so working on diet- in pool yesterday and doing deep water aerobics  Lab Results  Component Value Date   HGBA1C 5.8 12/31/2022   HGBA1C 5.9 12/27/2021   HGBA1C 5.9 06/30/2021  A/P: we discussed unlikely to qualify for Ozempic- continue to work on healthy eating and regular exercise and weight loss  Recommended follow up: Return for next already scheduled visit or sooner if needed. Future Appointments  Date Time Provider Department Center  07/03/2023  9:20 AM Shelva Majestic, MD [facility]  01/01/2024  9:00 AM Shelva Majestic, MD [facility]  01/09/2024  9:15 AM Sherrie George, MD [facility] None  03/23/2024  1:30 PM [facility] ANNUAL WELLNESS VISIT 1 [facility]    Lab/Order associations:   ICD-10-CM   1. Decreased pulses in feet  R09.89 VAS [place] ABI WITH/WO TBI    2. Paroxysmal atrial fibrillation (HCC)  I48.0     3. Hyperlipidemia, unspecified hyperlipidemia type  E78.5     4. Hyperglycemia  R73.9     5. Prediabetes  R73.03      Return precautions advised.  Tana Conch, MD

## 2023-04-03 ENCOUNTER — Ambulatory Visit (HOSPITAL_COMMUNITY)
Admission: RE | Admit: 2023-04-03 | Discharge: 2023-04-03 | Disposition: A | Discharge status: Home / Self Care | Payer: Medicare PPO | Source: Ambulatory Visit | Attending: Family Medicine | Admitting: Family Medicine

## 2023-04-03 DIAGNOSIS — R0989 Other specified symptoms and signs involving the circulatory and respiratory systems: Secondary | ICD-10-CM | POA: Insufficient documentation

## 2023-04-03 LAB — VAS US ABI WITH/WO TBI
Left ABI: 1.18
Right ABI: 1.26

## 2023-05-21 ENCOUNTER — Other Ambulatory Visit: Payer: Self-pay | Admitting: Cardiovascular Disease

## 2023-05-21 DIAGNOSIS — Z7901 Long term (current) use of anticoagulants: Secondary | ICD-10-CM

## 2023-05-21 DIAGNOSIS — I480 Paroxysmal atrial fibrillation: Secondary | ICD-10-CM

## 2023-05-21 NOTE — Telephone Encounter (Signed)
Eliquis 5mg  refill request received. Patient is 77 years old, weight-115.8kg, Crea-0.78 on 12/31/22, Diagnosis-Afib, and last seen by Dr. Kirke Corin on 09/25/22. Dose is appropriate based on dosing criteria. Will send in refill to requested pharmacy.

## 2023-05-21 NOTE — Telephone Encounter (Signed)
Refill request

## 2023-05-22 DIAGNOSIS — M25571 Pain in right ankle and joints of right foot: Secondary | ICD-10-CM | POA: Diagnosis not present

## 2023-05-22 DIAGNOSIS — S8264XA Nondisplaced fracture of lateral malleolus of right fibula, initial encounter for closed fracture: Secondary | ICD-10-CM | POA: Diagnosis not present

## 2023-05-23 ENCOUNTER — Telehealth: Payer: Self-pay | Admitting: *Deleted

## 2023-05-23 DIAGNOSIS — S8264XA Nondisplaced fracture of lateral malleolus of right fibula, initial encounter for closed fracture: Secondary | ICD-10-CM | POA: Diagnosis not present

## 2023-05-23 NOTE — Telephone Encounter (Signed)
Pharmacy please advise on holding Eliquis prior to  RIGHT ANKLE LATERAL MALLEOLUS ORIF, POSSIBLE SYNDESMOSIS AND/OR DELTOID FIXATION scheduled for 05/27/2023. Thank you.

## 2023-05-23 NOTE — Telephone Encounter (Signed)
   Pre-operative Risk Assessment    Patient Name: [name]  DOB: [birth year] MRN: [id number]      Request for Surgical Clearance    Procedure:   RIGHT ANKLE LATERAL MALLEOLUS ORIF, POSSIBLE SYNDESMOSIS AND/OR DELTOID FIXATION  Date of Surgery:  Clearance 05/27/23                                 Surgeon:  DR. Valli Glance Surgeon's Group or Practice Name:  [facility] Phone number:  [phone] ATTN: MEGAN DAVIS Fax number:  [phone]   Type of Clearance Requested:   - Medical  - Pharmacy:  Hold Apixaban (Eliquis)     Type of Anesthesia:   CHOICE   Additional requests/questions:    [name]   05/23/2023, 3:11 PM

## 2023-05-24 ENCOUNTER — Encounter (HOSPITAL_BASED_OUTPATIENT_CLINIC_OR_DEPARTMENT_OTHER): Payer: Self-pay | Admitting: Orthopaedic Surgery

## 2023-05-24 ENCOUNTER — Telehealth: Payer: Self-pay | Admitting: *Deleted

## 2023-05-24 ENCOUNTER — Other Ambulatory Visit: Payer: Self-pay

## 2023-05-24 ENCOUNTER — Ambulatory Visit: Payer: Medicare PPO | Attending: Adult Health

## 2023-05-24 DIAGNOSIS — Z01818 Encounter for other preprocedural examination: Secondary | ICD-10-CM

## 2023-05-24 NOTE — Telephone Encounter (Signed)
Pt has been added on today per pre op APP due to urgent surgery and med hold. Pt tele appt 05/24/23 @ 3:40. Med rec and consent are done.

## 2023-05-24 NOTE — Progress Notes (Signed)
   05/24/23 1253  Pre-op Phone Call  Surgery Date Verified 05/27/23  Arrival Time Verified 0915  Surgery Location Verified [facility]  Medical History Reviewed Yes  Is the patient taking a GLP-1 receptor agonist? No  Has the patient been informed on holding medication? No  Does the patient have diabetes? No diagnosis of diabetes  Do you have a history of heart problems? Yes  Cardiologist Name Cards clearance pending appt today at 3:40 Eliquis ins reviewed w/ pt  Have you ever had tests on your heart? Yes  What date/year were cardiac tests completed? See results section  Does patient have other implanted devices? No  Patient Teaching Pre / Post Procedure  Patient educated about smoking cessation 24 hours prior to surgery. N/A Non-Smoker  Patient verbalizes understanding of bowel prep? N/A  Med Rec Completed Yes  Take the Following Meds the Morning of Surgery metrorolol and use ellipta inhaler dos.  Recent  Lab Work, EKG, CXR? Yes  NPO (Including gum & candy) After midnight  Allowed clear liquids Water  Patient instructed to stop clear liquids including Carb loading drink at: 0715  Stop Solids, Milk, Candy, and Gum STARTING AT MIDNIGHT  Responsible adult to drive and be with you for 24 hours? Yes  Name & Phone Number for Ride/Caregiver husband Chrissie Noa  No Jewelry, money, nail polish or make-up.  No lotions, powders, perfumes. No shaving  48 hrs. prior to surgery. Yes  Contacts, Dentures & Glasses Will Have to be Removed Before OR. Yes  Please bring your ID and Insurance Card the morning of your surgery. (Surgery Centers Only) Yes  Bring any papers or x-rays with you that your surgeon gave you. Yes  Instructed to contact the location of procedure/ provider if they or anyone in their household develops symptoms or tests positive for COVID-19, has close contact with someone who tests positive for COVID, or has known exposure to any contagious illness. Yes  Call this number the morning of surgery   with any problems that may cancel your surgery. [phone]   Reviewed w/ Dr Tacy Dura- may proceed w/ surgery at [facility] as planned pending cardiology appt scheduled for later today.

## 2023-05-24 NOTE — Telephone Encounter (Signed)
   Name: [name]  DOB: 04-14-1946  MRN: [id number]  Primary Cardiologist: Lorine Bears, MD   Preoperative team, please contact this patient and set up a phone call appointment for further preoperative risk assessment. Please obtain consent and complete medication review. Thank you for your help.Last seen on 09/25/2022  I confirm that guidance regarding antiplatelet and oral anticoagulation therapy has been completed and, if necessary, noted below.  Pharm:  Per office protocol, patient can hold Eliquis for 2-3 days prior to procedure.    Joni Reining, NP 05/24/2023, 9:34 AM [facility]

## 2023-05-24 NOTE — Telephone Encounter (Signed)
Patient with diagnosis of afib on Eliquis for anticoagulation.    Procedure: RIGHT ANKLE LATERAL MALLEOLUS ORIF, POSSIBLE SYNDESMOSIS AND/OR DELTOID FIXATION   Date of procedure: 05/27/23  CHA2DS2-VASc Score = 3  This indicates a 3.2% annual risk of stroke. The patient's score is based upon: CHF History: 0 HTN History: 0 Diabetes History: 0 Stroke History: 0 Vascular Disease History: 0 Age Score: 2 Gender Score: 1   CrCl 18mL/min using adjusted body weight due to obesity Platelet count 167K  Per office protocol, patient can hold Eliquis for 2-3 days prior to procedure.    **This guidance is not considered finalized until pre-operative APP has relayed final recommendations.**

## 2023-05-24 NOTE — Progress Notes (Signed)
Virtual Visit via Telephone Note   Because of [name] co-morbid illnesses, she is at least at moderate risk for complications without adequate follow up.  This format is felt to be most appropriate for this patient at this time.  The patient did not have access to video technology/had technical difficulties with video requiring transitioning to audio format only (telephone).  All issues noted in this document were discussed and addressed.  No physical exam could be performed with this format.  Please refer to the patient's chart for her consent to telehealth for [facility].  Evaluation Performed:  Preoperative cardiovascular risk assessment _____________   Date:  05/24/2023   Patient ID:  [name], [name] 08-25-1946, MRN [id number] Patient Location:  Home Provider location:   Office  Primary Care Provider:  Shelva Majestic, MD Primary Cardiologist:  Lorine Bears, MD  Chief Complaint / Patient Profile   77 y.o. y/o female with a h/o paroxysmal atrial fibrillation, COPD,  normal stress Myoview in 2016, on Eliquis.    She is pending right ankle lateral malleus ORIF, possible syndesmosis and/or deltoid fixation on 05/27/2023 by Dr. Valli Glance and presents today for telephonic preoperative cardiovascular risk assessment.   History of Present Illness    [name] is a 77 y.o. female who presents via audio/video conferencing for a telehealth visit today.  Pt was last seen in cardiology clinic on 09/25/2022 by Arida.  At that time [name] was doing well .  The patient is now pending procedure as outlined above. Since her last visit, she has been doing well except for her knees and ankle. She denies chest pain, DOE, edema. Dizziness, or near syncope, no palpations. She is unaware of atrial fib.   Past Medical History    Past Medical History:  Diagnosis Date   Allergy    ASCUS on Pap smear 2007   ASTHMA UNSPECIFIED WITH EXACERBATION 11/08/2008   Atrial  fibrillation (HCC)    Cancer of skin of left ear    Chest pain 03/2011   negative stress test - Dr. Myrtis Ser   History of frequent urinary tract infections    History of kidney stones    HYPERLIPIDEMIA, BORDERLINE 02/09/2009   Kidney stones    OSTEOARTHRITIS 04/24/2007   Osteopenia    Osteoporosis    Pneumonia    PONV (postoperative nausea and vomiting)    SHINGLES 11/08/2008   had vaccine at drugstore 12/14   VARICOSE VEINS LOWER EXTREMITIES W/INFLAMMATION 02/05/2008   Past Surgical History:  Procedure Laterality Date   Bone spur shoulder     x2 removed   COLPOSCOPY     Neg HR HPV   KNEE ARTHROSCOPY     laser surgery per left ey     secondary to film covering    lens implants- both eyes     SPINE SURGERY  2008   lumbar   TOTAL KNEE ARTHROPLASTY Right 08/07/2016   Procedure: RIGHT TOTAL KNEE ARTHROPLASTY;  Surgeon: Durene Romans, MD;  Location: [facility];  Service: Orthopedics;  Laterality: Right;    Allergies  Allergies  Allergen Reactions   Atorvastatin     Myalgia   Cefuroxime Axetil Diarrhea and Nausea And Vomiting   Other     Home Medications    Prior to Admission medications   Medication Sig Start Date End Date Taking? Authorizing Provider  apixaban (ELIQUIS) 5 MG TABS tablet Take 1 tablet by mouth twice daily 05/21/23  Iran Ouch, MD  Calcium Citrate 250 MG TABS Take 1,000 mg by mouth 2 (two) times daily.    [provider]  Cholecalciferol (VITAMIN D3) 2000 UNITS TABS Take 2,000 Units by mouth 2 (two) times daily.    [provider]  fish oil-omega-3 fatty acids 1000 MG capsule Take 2 capsules (2 g total) by mouth 2 (two) times daily. total for 4,000 mg 04/20/11   Stacie Glaze, MD  fluticasone [facility]) 50 MCG/ACT nasal spray Place 2 sprays into both nostrils daily. 06/30/21   Shelva Majestic, MD  Fluticasone Furoate (ARNUITY ELLIPTA) 100 MCG/ACT AEPB Inhale 100 mcg into the lungs daily. 08/28/22   Shelva Majestic, MD  GLUCOSAMINE SULFATE  PO Take 2 tablets by mouth 2 (two) times daily.     [provider]  LORATADINE PO Take 10 mg by mouth daily.    [provider]  metoprolol tartrate (LOPRESSOR) 25 MG tablet Take 1 tablet by mouth twice daily 12/06/22   Iran Ouch, MD  Multiple Vitamin (MULTIVITAMIN) tablet Take 1 tablet by mouth daily.    [provider]  Multiple Vitamins-Minerals (PRESERVISION AREDS) CAPS  07/07/18   [provider]  PROAIR HFA 108 [phone] Base) MCG/ACT inhaler INHALE TWO PUFFS INTO LUNGS EVERY 6 HOURS AS NEEDED FOR WHEEZING FOR SHORTNESS OF BREATH 04/01/17   Shelva Majestic, MD  rosuvastatin (CRESTOR) 10 MG tablet Take 1 tablet by mouth once daily 11/05/22   Iran Ouch, MD    Physical Exam    Vital Signs:  [name] does not have vital signs available for review today.  Given telephonic nature of communication, physical exam is limited. AAOx3. NAD. Normal affect.  Speech and respirations are unlabored.  Accessory Clinical Findings    None  Assessment & Plan    1.  Preoperative Cardiovascular Risk Assessment:  According to the Revised Cardiac Risk Index (RCRI), her Perioperative Risk of Major Cardiac Event is (%): 0.4  Her Functional Capacity in METs is: 7.01 according to the [place] Activity Status Index (DASI).   The patient was advised that if she develops new symptoms prior to surgery to contact our office to arrange for a follow-up visit, and she verbalized understanding.  CrCl 72mL/min using adjusted body weight due to obesity Platelet count 167K   Per office protocol, patient can hold Eliquis for 2-3 days prior to procedure.    Therefore, based on ACC/AHA guidelines, patient would be at acceptable risk for the planned procedure without further cardiovascular testing. I will route this recommendation to the requesting party via Epic fax function.   Time:   Today, I have spent 10 minutes with the patient with telehealth technology discussing  medical history, symptoms, and management plan.     Joni Reining, NP  05/24/2023, 3:47 PM

## 2023-05-24 NOTE — Telephone Encounter (Signed)
Pt has been added on today per pre op APP due to urgent surgery and med hold. Pt tele appt 05/24/23 @ 3:40. Med rec and consent are done.     Patient Consent for Virtual Visit        [name] has provided verbal consent on 05/24/2023 for a virtual visit (video or telephone).   CONSENT FOR VIRTUAL VISIT FOR:  [name]  By participating in this virtual visit I agree to the following:  I hereby voluntarily request, consent and authorize [facility] and its employed or contracted physicians, physician assistants, nurse practitioners or other licensed health care professionals (the Practitioner), to provide me with telemedicine health care services (the "Services") as deemed necessary by the treating Practitioner. I acknowledge and consent to receive the Services by the Practitioner via telemedicine. I understand that the telemedicine visit will involve communicating with the Practitioner through live audiovisual communication technology and the disclosure of certain medical information by electronic transmission. I acknowledge that I have been given the opportunity to request an in-person assessment or other available alternative prior to the telemedicine visit and am voluntarily participating in the telemedicine visit.  I understand that I have the right to withhold or withdraw my consent to the use of telemedicine in the course of my care at any time, without affecting my right to future care or treatment, and that the Practitioner or I may terminate the telemedicine visit at any time. I understand that I have the right to inspect all information obtained and/or recorded in the course of the telemedicine visit and may receive copies of available information for a reasonable fee.  I understand that some of the potential risks of receiving the Services via telemedicine include:  Delay or interruption in medical evaluation due to technological equipment failure or disruption; Information  transmitted may not be sufficient (e.g. poor resolution of images) to allow for appropriate medical decision making by the Practitioner; and/or  In rare instances, security protocols could fail, causing a breach of personal health information.  Furthermore, I acknowledge that it is my responsibility to provide information about my medical history, conditions and care that is complete and accurate to the best of my ability. I acknowledge that Practitioner's advice, recommendations, and/or decision may be based on factors not within their control, such as incomplete or inaccurate data provided by me or distortions of diagnostic images or specimens that may result from electronic transmissions. I understand that the practice of medicine is not an exact science and that Practitioner makes no warranties or guarantees regarding treatment outcomes. I acknowledge that a copy of this consent can be made available to me via my patient portal [facility] MyChart), or I can request a printed copy by calling the office of [facility].    I understand that my insurance will be billed for this visit.   I have read or had this consent read to me. I understand the contents of this consent, which adequately explains the benefits and risks of the Services being provided via telemedicine.  I have been provided ample opportunity to ask questions regarding this consent and the Services and have had my questions answered to my satisfaction. I give my informed consent for the services to be provided through the use of telemedicine in my medical care

## 2023-05-27 ENCOUNTER — Ambulatory Visit (HOSPITAL_BASED_OUTPATIENT_CLINIC_OR_DEPARTMENT_OTHER): Admission: RE | Admit: 2023-05-27 | Payer: Medicare PPO | Source: Home / Self Care | Admitting: Orthopaedic Surgery

## 2023-05-27 SURGERY — OPEN REDUCTION INTERNAL FIXATION (ORIF) ANKLE FRACTURE
Anesthesia: Choice | Site: Ankle | Laterality: Right

## 2023-05-27 MED ORDER — BUPIVACAINE HCL (PF) 0.25 % IJ SOLN
INTRAMUSCULAR | Status: AC
  Filled 2023-05-27: qty 30

## 2023-05-30 ENCOUNTER — Encounter (INDEPENDENT_AMBULATORY_CARE_PROVIDER_SITE_OTHER): Payer: Self-pay

## 2023-05-30 DIAGNOSIS — S8264XD Nondisplaced fracture of lateral malleolus of right fibula, subsequent encounter for closed fracture with routine healing: Secondary | ICD-10-CM | POA: Diagnosis not present

## 2023-05-30 DIAGNOSIS — M25571 Pain in right ankle and joints of right foot: Secondary | ICD-10-CM | POA: Diagnosis not present

## 2023-06-07 ENCOUNTER — Telehealth: Payer: Self-pay | Admitting: Family Medicine

## 2023-06-07 NOTE — Telephone Encounter (Signed)
Please see message. °

## 2023-06-07 NOTE — Telephone Encounter (Signed)
Patient states she's been using the Arunity Ellipta inhaler but believes this has been causing oral thrush. Patient wants to know what PCP would recommend. Pcp isn't available until September. Did you want her to see someone else or just call in something that could assist with this?

## 2023-06-08 NOTE — Telephone Encounter (Signed)
Is she already rinsing her mouth out with water and spitting out after each use?

## 2023-06-11 NOTE — Telephone Encounter (Signed)
See below

## 2023-06-11 NOTE — Telephone Encounter (Signed)
Patient called in and upon me asking her PCP question below, she stated she has been rinsing her mouth out. States she has noticed if she doesn't use it then the thrush goes away but once she does it returns.

## 2023-06-11 NOTE — Telephone Encounter (Signed)
The issue is that any of the other inhalers that have a steroid can cause the same issue.  If she is actively having the issue I can send in nystatin to swish and spit but recurrence could certainly happen still and we want to control lung disease - Could also offer pulmonary referral to see if they have another opinion

## 2023-06-12 MED ORDER — NYSTATIN 100000 UNIT/ML MT SUSP: 5.0000 mL | Freq: Four times a day (QID) | OROMUCOSAL | 1 refills | Status: DC

## 2023-06-12 NOTE — Telephone Encounter (Signed)
I sent in nystatin-we may need to look at switching the inhaler given this history at follow-up

## 2023-06-12 NOTE — Addendum Note (Signed)
Addended by: Shelva Majestic on: 06/12/2023 02:11 PM   Modules accepted: Orders

## 2023-06-12 NOTE — Telephone Encounter (Signed)
Called and spoke with pt and below message given. Pt states she has been on several inhalers over the years and this is the only inhaler that has caused this. She is willing to try the Nystatin and she will need a refill of the inhaler also, she was not interested in Pulmonology referral.

## 2023-06-13 DIAGNOSIS — S8264XA Nondisplaced fracture of lateral malleolus of right fibula, initial encounter for closed fracture: Secondary | ICD-10-CM | POA: Diagnosis not present

## 2023-06-13 DIAGNOSIS — M25571 Pain in right ankle and joints of right foot: Secondary | ICD-10-CM | POA: Diagnosis not present

## 2023-06-18 ENCOUNTER — Telehealth: Payer: Self-pay | Admitting: Family Medicine

## 2023-06-18 NOTE — Telephone Encounter (Signed)
She can come but would be the 1140 slot on the 16th and there is a patient in between them-definitely would not be able to do any other add-ons that day-they may have a prolonged wait and may have to do labs after lunch as well

## 2023-06-18 NOTE — Telephone Encounter (Signed)
See below

## 2023-06-18 NOTE — Telephone Encounter (Signed)
Patient has an OV on 07/03/23 and husband has HFU on 9/16 @ 11 am with PCP. States that she broke her ankle and can't drive and she doesn't believe her husband can drive 30 miles back to back like that. Patient wants to know if she could be seen  the same day as her husband. Please Advise.

## 2023-06-19 NOTE — Telephone Encounter (Signed)
See below

## 2023-06-19 NOTE — Telephone Encounter (Signed)
Called and informed patient of PCP message. Patient verbalized understanding and agreed that would be okay. Patient has been moved to 11:40 am on 9/16.

## 2023-07-01 ENCOUNTER — Ambulatory Visit: Payer: Medicare PPO | Admitting: Family Medicine

## 2023-07-01 ENCOUNTER — Encounter: Payer: Self-pay | Admitting: Family Medicine

## 2023-07-01 VITALS — BP 118/72 | HR 58 | Temp 97.7°F | Ht 63.75 in | Wt 251.0 lb

## 2023-07-01 DIAGNOSIS — R739 Hyperglycemia, unspecified: Secondary | ICD-10-CM | POA: Diagnosis not present

## 2023-07-01 DIAGNOSIS — J449 Chronic obstructive pulmonary disease, unspecified: Secondary | ICD-10-CM

## 2023-07-01 DIAGNOSIS — E785 Hyperlipidemia, unspecified: Secondary | ICD-10-CM | POA: Diagnosis not present

## 2023-07-01 DIAGNOSIS — I480 Paroxysmal atrial fibrillation: Secondary | ICD-10-CM

## 2023-07-01 DIAGNOSIS — Z131 Encounter for screening for diabetes mellitus: Secondary | ICD-10-CM

## 2023-07-01 MED ORDER — SPIRIVA RESPIMAT 2.5 MCG/ACT IN AERS: 2.0000 | INHALATION_SPRAY | Freq: Every day | RESPIRATORY_TRACT | 11 refills | Status: DC

## 2023-07-01 NOTE — Progress Notes (Signed)
Phone [phone] In person visit   Subjective:   [name] is a 77 y.o. year old very pleasant female patient who presents for/with See problem oriented charting Chief Complaint  Patient presents with   Medical Management of Chronic Issues    Past Medical History-  Patient Active Problem List   Diagnosis Date Noted   Paroxysmal atrial fibrillation (HCC) 12/11/2017    Priority: High   Hyperglycemia 12/31/2022    Priority: Medium    Macular degeneration 12/22/2019    Priority: Medium    Atypical chest pain 12/27/2014    Priority: Medium    Hyperlipidemia 02/09/2009    Priority: Medium    COPD (chronic obstructive pulmonary disease) (HCC) 02/05/2008    Priority: Medium    S/P right TKA 08/07/2016    Priority: Low   Dysphagia 11/14/2015    Priority: Low   Class 3 severe obesity due to excess calories without serious comorbidity with body mass index (BMI) of 40.0 to 44.9 in adult (HCC) 06/17/2014    Priority: Low   Kidney stones 06/17/2014    Priority: Low   Osteopenia     Priority: Low   Allergic rhinitis 05/07/2008    Priority: Low   VARICOSE VEINS LOWER EXTREMITIES W/INFLAMMATION 02/05/2008    Priority: Low   Osteoarthritis 04/24/2007    Priority: Low    Medications- reviewed and updated Current Outpatient Medications  Medication Sig Dispense Refill   apixaban (ELIQUIS) 5 MG TABS tablet Take 1 tablet by mouth twice daily 60 tablet 5   Calcium Citrate 250 MG TABS Take 1,000 mg by mouth 2 (two) times daily.     Cholecalciferol (VITAMIN D3) 2000 UNITS TABS Take 2,000 Units by mouth 2 (two) times daily.     fish oil-omega-3 fatty acids 1000 MG capsule Take 2 capsules (2 g total) by mouth 2 (two) times daily. total for 4,000 mg     GLUCOSAMINE SULFATE PO Take 2 tablets by mouth 2 (two) times daily.      LORATADINE PO Take 10 mg by mouth daily.     metoprolol tartrate (LOPRESSOR) 25 MG tablet Take 1 tablet by mouth twice daily 180 tablet 2   Multiple Vitamin  (MULTIVITAMIN) tablet Take 1 tablet by mouth daily.     Multiple Vitamins-Minerals (PRESERVISION AREDS) CAPS      PROAIR HFA 108 (90 Base) MCG/ACT inhaler INHALE TWO PUFFS INTO LUNGS EVERY 6 HOURS AS NEEDED FOR WHEEZING FOR SHORTNESS OF BREATH 9 each 5   rosuvastatin (CRESTOR) 10 MG tablet Take 1 tablet by mouth once daily 90 tablet 3   Tiotropium Bromide Monohydrate (SPIRIVA RESPIMAT) 2.5 MCG/ACT AERS Inhale 2 puffs into the lungs daily. 4 g 11   No current facility-administered medications for this visit.     Objective:  BP 118/72   Pulse (!) 58   Temp 97.7 F (36.5 C)   Ht 5' 3.75" (1.619 m)   Wt 251 lb (113.9 kg)   SpO2 96%   BMI 43.42 kg/m  Gen: NAD, resting comfortably CV: regular rhythm no murmurs rubs or gallops Lungs: CTAB no crackles, wheeze, rhonchi Ext: trace edema on left leg, right leg in cam walker Skin: warm, dry     Assessment and Plan    # Atrial fibrillation- follows with Dr. Kirke Corin S: Rate controlled with metoprolol 25 mg twice daily Anticoagulated with Eliquis 5 mg twice daily A/P: appropriately anticoagulated and rate controlled- continue current medicine  #COPD S: Medication: arnuity ellipta 100 mcg  daily- controls symptoms but not needed.   Has albuterol inhaler and typically uses- almost never  - nyastatin helpful but worried about reuturn -Also takes loratadine for allergies  -flovent and arnuity not coveredb A/P: with thrush on arnuity and fact that she has copd and not asthma- opted to try spiriva instead to see if this continues to help her- can use albuterol as back up if needed    #hyperlipidemia S: medication: Rosuvastatin 10 mg, fish oil 1000 mg daily Lab Results  Component Value Date   CHOL 140 12/31/2022   HDL 56.50 12/31/2022   LDLCALC 64 12/31/2022   LDLDIRECT 149.5 10/24/2012   TRIG 99.0 12/31/2022   CHOLHDL 2 12/31/2022  A/P:  reasonable control- continue current medications   # Hyperglycemia/insulin  resistance/prediabetes-peak A1c of 5.9 S:  Medication: none Exercise and diet- exercise limited with recent injury. Down 4 lbs from last visit but they subtracted 5 lbs for shoes/boot she reports Lab Results  Component Value Date   HGBA1C 5.8 12/31/2022   HGBA1C 5.9 12/27/2021   HGBA1C 5.9 06/30/2021  A/P: hopefully stable- update a1c today. Continue without meds for now   # Reassuring ABI on 04/03/2023- no further follow up needed   #Ankle fracture- wasn't wearing glasses got out of pool and slipped on pool deck on August 5th- wet flip flop and felt pain immediately- ended up with walking boot/cam walker. Reports fracture in 2 places. Working with Dr. Odis Hollingshead with last visit 05/30/23 - also had shot in her knee to help with trip to [place] this [place]  Recommended follow up: Return for next already scheduled visit or sooner if needed. Future Appointments  Date Time Provider Department Center  01/01/2024  9:00 AM Shelva Majestic, MD [facility]  01/09/2024  9:15 AM Sherrie George, MD [facility] None  03/23/2024  1:30 PM [facility] ANNUAL WELLNESS VISIT 1 [facility]   Lab/Order associations:   ICD-10-CM   1. Paroxysmal atrial fibrillation (HCC)  I48.0     2. Hyperlipidemia, unspecified hyperlipidemia type  E78.5 CBC with Differential/Platelet    Comprehensive metabolic panel    Hemoglobin A1c    CANCELED: Comprehensive metabolic panel    CANCELED: CBC with Differential/Platelet    CANCELED: Hemoglobin A1c    3. Chronic obstructive pulmonary disease, unspecified COPD type (HCC)  J44.9     4. Hyperglycemia  R73.9     5. Screening for diabetes mellitus  Z13.1      Meds ordered this encounter  Medications   Tiotropium Bromide Monohydrate (SPIRIVA RESPIMAT) 2.5 MCG/ACT AERS    Sig: Inhale 2 puffs into the lungs daily.    Dispense:  4 g    Refill:  11    Return precautions advised.  Tana Conch, MD

## 2023-07-01 NOTE — Patient Instructions (Addendum)
Let [name] know when you get your flu and COVID vaccine.  with thrush on arnuity and fact that she has copd and not asthma- opted to try spiriva instead to see if this continues to help her- can use albuterol as back up if needed   Please stop by lab before you go If you have mychart- we will send your results within 3 business days of [name] receiving them.  If you do not have mychart- we will call you about results within 5 business days of [name] receiving them.  *please also note that you will see labs on mychart as soon as they post. I will later go in and write notes on them- will say "notes from Dr. Durene Cal"    Recommended follow up: Return for next already scheduled visit or sooner if needed.

## 2023-07-02 DIAGNOSIS — S8264XA Nondisplaced fracture of lateral malleolus of right fibula, initial encounter for closed fracture: Secondary | ICD-10-CM | POA: Diagnosis not present

## 2023-07-02 DIAGNOSIS — S8264XD Nondisplaced fracture of lateral malleolus of right fibula, subsequent encounter for closed fracture with routine healing: Secondary | ICD-10-CM | POA: Diagnosis not present

## 2023-07-03 ENCOUNTER — Ambulatory Visit: Payer: Medicare PPO | Admitting: Family Medicine

## 2023-07-30 ENCOUNTER — Other Ambulatory Visit (INDEPENDENT_AMBULATORY_CARE_PROVIDER_SITE_OTHER): Payer: Medicare PPO

## 2023-07-30 DIAGNOSIS — S8264XD Nondisplaced fracture of lateral malleolus of right fibula, subsequent encounter for closed fracture with routine healing: Secondary | ICD-10-CM | POA: Diagnosis not present

## 2023-07-30 DIAGNOSIS — E785 Hyperlipidemia, unspecified: Secondary | ICD-10-CM

## 2023-07-30 LAB — CBC WITH DIFFERENTIAL/PLATELET
Basophils Absolute: 0 10*3/uL (ref 0.0–0.1)
Basophils Relative: 0.7 % (ref 0.0–3.0)
Eosinophils Absolute: 0.1 10*3/uL (ref 0.0–0.7)
Eosinophils Relative: 1.4 % (ref 0.0–5.0)
HCT: 39.1 % (ref 36.0–46.0)
Hemoglobin: 12.9 g/dL (ref 12.0–15.0)
Lymphocytes Relative: 13.7 % (ref 12.0–46.0)
Lymphs Abs: 0.9 10*3/uL (ref 0.7–4.0)
MCHC: 33 g/dL (ref 30.0–36.0)
MCV: 96.3 fL (ref 78.0–100.0)
Monocytes Absolute: 0.6 10*3/uL (ref 0.1–1.0)
Monocytes Relative: 8.5 % (ref 3.0–12.0)
Neutro Abs: 4.9 10*3/uL (ref 1.4–7.7)
Neutrophils Relative %: 75.7 % (ref 43.0–77.0)
Platelets: 153 10*3/uL (ref 150.0–400.0)
RBC: 4.06 Mil/uL (ref 3.87–5.11)
RDW: 13.5 % (ref 11.5–15.5)
WBC: 6.5 10*3/uL (ref 4.0–10.5)

## 2023-07-30 LAB — COMPREHENSIVE METABOLIC PANEL
ALT: 13 U/L (ref 0–35)
AST: 16 U/L (ref 0–37)
Albumin: 4 g/dL (ref 3.5–5.2)
Alkaline Phosphatase: 107 U/L (ref 39–117)
BUN: 24 mg/dL — ABNORMAL HIGH (ref 6–23)
CO2: 28 meq/L (ref 19–32)
Calcium: 9.5 mg/dL (ref 8.4–10.5)
Chloride: 105 meq/L (ref 96–112)
Creatinine, Ser: 0.74 mg/dL (ref 0.40–1.20)
GFR: 78.47 mL/min (ref >60.00)
Glucose, Bld: 100 mg/dL — ABNORMAL HIGH (ref 70–99)
Potassium: 4.2 meq/L (ref 3.5–5.1)
Sodium: 141 meq/L (ref 135–145)
Total Bilirubin: 0.6 mg/dL (ref 0.2–1.2)
Total Protein: 6.9 g/dL (ref 6.0–8.3)

## 2023-07-30 LAB — HEMOGLOBIN A1C: Hgb A1c MFr Bld: 5.8 % (ref 4.6–6.5)

## 2023-09-05 ENCOUNTER — Other Ambulatory Visit: Payer: Self-pay | Admitting: Cardiovascular Disease

## 2023-09-05 DIAGNOSIS — I480 Paroxysmal atrial fibrillation: Secondary | ICD-10-CM

## 2023-09-05 NOTE — Telephone Encounter (Signed)
Good Morning,   Could you schedule this patient a 12 month follow up visit? The patient was last seen by Dr. Kirke Corin on 09-25-2022. Thank you so much.

## 2023-09-05 NOTE — Telephone Encounter (Signed)
Last office visit 09/25/22 with plan to f/u in 6 months next office visit: none/active recall

## 2023-09-24 ENCOUNTER — Other Ambulatory Visit: Payer: Self-pay | Admitting: Family Medicine

## 2023-11-14 ENCOUNTER — Other Ambulatory Visit: Payer: Self-pay | Admitting: Family Medicine

## 2023-11-14 NOTE — Telephone Encounter (Signed)
Copied from CRM [id number]. Topic: Clinical - Prescription Issue >> Nov 14, 2023  1:07 PM Brandy W wrote: Reason for CRM: patient Tiotropium Bromide Monohydrate (SPIRIVA RESPIMAT) 2.5 MCG/ACT AERS-  patient is almost out but does not want to take this any longer she does not like it  Patient is requesting to be back on her  ARNUITY ELLIPTA 100 MCG/ACT AEPB [Pharmacy Med Name: Arnuity Ellipta 100 MCG/ACT Inhalation Aerosol Powder Breath Activated refilled and go back to that medication as she states she likes it more and it seems to work better, she stated the   [facility] - [place] ([place]), [place] - [address] [place] ([place]) [place] [address] Phone: [phone] Fax: [phone]  Should have already put in a  request for this

## 2023-11-22 ENCOUNTER — Telehealth: Payer: Self-pay | Admitting: Cardiovascular Disease

## 2023-11-22 ENCOUNTER — Other Ambulatory Visit: Payer: Self-pay | Admitting: Cardiovascular Disease

## 2023-11-22 DIAGNOSIS — E782 Mixed hyperlipidemia: Secondary | ICD-10-CM

## 2023-11-22 NOTE — Telephone Encounter (Signed)
 Patient has a cold/cough and is wanting to know what over the counter meds she can take, please advise.

## 2023-11-22 NOTE — Telephone Encounter (Signed)
 Please contact pt for future appointment. Pt overdue for follow up. Pt must be seen yearly for refills.

## 2023-11-26 ENCOUNTER — Ambulatory Visit: Payer: Medicare PPO | Admitting: Family Medicine

## 2023-11-28 ENCOUNTER — Ambulatory Visit: Payer: Medicare PPO | Admitting: Family Medicine

## 2023-11-28 ENCOUNTER — Encounter: Payer: Self-pay | Admitting: Family Medicine

## 2023-11-28 VITALS — BP 114/76 | HR 61 | Temp 97.9°F | Ht 63.75 in | Wt 256.8 lb

## 2023-11-28 DIAGNOSIS — Z6841 Body Mass Index (BMI) 40.0 and over, adult: Secondary | ICD-10-CM | POA: Diagnosis not present

## 2023-11-28 DIAGNOSIS — B9689 Other specified bacterial agents as the cause of diseases classified elsewhere: Secondary | ICD-10-CM

## 2023-11-28 DIAGNOSIS — G720 Drug-induced myopathy: Secondary | ICD-10-CM | POA: Diagnosis not present

## 2023-11-28 DIAGNOSIS — E785 Hyperlipidemia, unspecified: Secondary | ICD-10-CM | POA: Diagnosis not present

## 2023-11-28 DIAGNOSIS — J432 Centrilobular emphysema: Secondary | ICD-10-CM | POA: Diagnosis not present

## 2023-11-28 DIAGNOSIS — I480 Paroxysmal atrial fibrillation: Secondary | ICD-10-CM | POA: Diagnosis not present

## 2023-11-28 MED ORDER — TRELEGY ELLIPTA 100-62.5-25 MCG/ACT IN AEPB: 1.0000 | INHALATION_SPRAY | Freq: Every day | RESPIRATORY_TRACT | 11 refills | Status: DC

## 2023-11-28 NOTE — Progress Notes (Signed)
Phone [phone] In person visit   Subjective:   [name] is a 78 y.o. year old very pleasant female patient who presents for/with See problem oriented charting Chief Complaint  Patient presents with   Muscle Pain    Pt states that she is having muscle pain and aches from statin medication for 2 months - pt stopped medication last week    Wheezing    Pt c/o of wheezing every now and then w/ rattling cough x 2 weeks - getting somewhat better, has not tried anything OTC     Past Medical History-  Patient Active Problem List   Diagnosis Date Noted   Paroxysmal atrial fibrillation (HCC) 12/11/2017    Priority: High   Hyperglycemia 12/31/2022    Priority: Medium    Macular degeneration 12/22/2019    Priority: Medium    Atypical chest pain 12/27/2014    Priority: Medium    Hyperlipidemia 02/09/2009    Priority: Medium    COPD (chronic obstructive pulmonary disease) (HCC) 02/05/2008    Priority: Medium    S/P right TKA 08/07/2016    Priority: Low   Dysphagia 11/14/2015    Priority: Low   Class 3 severe obesity due to excess calories without serious comorbidity with body mass index (BMI) of 40.0 to 44.9 in adult (HCC) 06/17/2014    Priority: Low   Kidney stones 06/17/2014    Priority: Low   Osteopenia     Priority: Low   Allergic rhinitis 05/07/2008    Priority: Low   VARICOSE VEINS LOWER EXTREMITIES W/INFLAMMATION 02/05/2008    Priority: Low   Osteoarthritis 04/24/2007    Priority: Low    Medications- reviewed and updated Current Outpatient Medications  Medication Sig Dispense Refill   apixaban (ELIQUIS) 5 MG TABS tablet Take 1 tablet by mouth twice daily 60 tablet 5   Calcium Citrate 250 MG TABS Take 1,000 mg by mouth 2 (two) times daily.     Cholecalciferol (VITAMIN D3) 2000 UNITS TABS Take 2,000 Units by mouth 2 (two) times daily.     fish oil-omega-3 fatty acids 1000 MG capsule Take 2 capsules (2 g total) by mouth 2 (two) times daily. total for 4,000 mg      Fluticasone-Umeclidin-Vilant (TRELEGY ELLIPTA) 100-62.5-25 MCG/ACT AEPB Inhale 1 puff into the lungs daily. 60 each 11   GLUCOSAMINE SULFATE PO Take 2 tablets by mouth 2 (two) times daily.      LORATADINE PO Take 10 mg by mouth daily.     metoprolol tartrate (LOPRESSOR) 25 MG tablet Take 1 tablet (25 mg total) by mouth 2 (two) times daily. Due yearly follow up.  PLEASE CALL OFFICE TO SCHEDULE APPOINTMENT PRIOR TO NEXT REFILL 60 tablet 0   Multiple Vitamin (MULTIVITAMIN) tablet Take 1 tablet by mouth daily.     Multiple Vitamins-Minerals (PRESERVISION AREDS) CAPS      rosuvastatin (CRESTOR) 10 MG tablet Take 1 tablet by mouth once daily 30 tablet 0   PROAIR HFA 108 (90 Base) MCG/ACT inhaler INHALE TWO PUFFS INTO LUNGS EVERY 6 HOURS AS NEEDED FOR WHEEZING FOR SHORTNESS OF BREATH (Patient not taking: Reported on 11/28/2023) 9 each 5   No current facility-administered medications for this visit.     Objective:  BP 114/76   Pulse 61   Temp 97.9 F (36.6 C)   Ht 5' 3.75" (1.619 m)   Wt 256 lb 12.8 oz (116.5 kg)   SpO2 98%   BMI 44.43 kg/m  Gen: NAD, resting comfortably  Left frontal sinus tenderness and right maxillary sinus tenderness noted.  Nasal turbinates erythematous and edematous with yellow discharge.  Pharynx with some drainage and mild erythema CV: RRR no murmurs rubs or gallops Lungs: CTAB no crackles, wheeze, rhonchi Ext: Trace edema Skin: warm, dry     Assessment and Plan     # Atrial fibrillation- follows with Dr. Kirke Corin S: Rate controlled with metoprolol 25 mg twice daily Anticoagulated with Eliquis 5 mg twice daily A/P: appropriately anticoagulated and rate controlled- continue current medicine  #COPD S: Medication: spiriva didn't help and she stopped it-no wheezing cough.  arnuity ellipta 100 mcg daily she restarted this and found helpful but out for 2 weeks.    Has albuterol inhaler and typically rarely uses -Has been having some off-and-on wheezing. Has not used her  albuterol -Has had a more rattly cough for the last 2 weeks but seems to be improving -getting a lot of sinus drainage and some sinus pressure and right ear pain. No fever -Also takes loratadine for allergies  A/P: Lungs are clear today.  She feels like her breathing is better on Arnuity and Spiriva.  Reflecting back Arnuity would be more for asthma-looked at some other options like Symbicort but did not appear covered and we ultimately went with Trelegy -For her acute symptoms appears to have bacterial sinusitis with 2 weeks of symptoms including sinus pressure and drainage that is not improving-sent in Augmentin to cover this   #hyperlipidemia S: medication: Rosuvastatin 10 mg daily but report having muscle aches for the last 2 months and stopped about a week ago with improvement in symptoms- full resolution within a few days (tried the same thing in the past) , fish oil 1000 mg daily A/P: cholesterol has been well controlled on rosuvastatin 10 mg daily but getting muscle aches- she is feeling better and is going to try every other day and see if symptoms stay away- update me at march physical  # Morbid obesity-weight down before Christmas then came back up- going down to [employer] and encouraged her to be cautious about weight gain-ideally would focus on weight loss especially with prediabetes  Recommended follow up: Return for next already scheduled visit or sooner if needed. Future Appointments  Date Time Provider Department Center  12/17/2023  4:00 PM Iran Ouch, MD [facility] None  01/01/2024  9:00 AM Shelva Majestic, MD [facility]  01/09/2024  9:15 AM Sherrie George, MD [facility] None  03/23/2024  1:30 PM [facility] ANNUAL WELLNESS VISIT 1 [facility]   Lab/Order associations:   ICD-10-CM   1. Paroxysmal atrial fibrillation (HCC)  I48.0     2. Hyperlipidemia, unspecified hyperlipidemia type  E78.5     3. Centrilobular emphysema (HCC)  J43.2     4. Drug-induced myopathy  G72.0      5. Bacterial sinusitis  J32.9    B96.89     6. Morbid obesity (HCC) Chronic E66.01       Meds ordered this encounter  Medications   Fluticasone-Umeclidin-Vilant (TRELEGY ELLIPTA) 100-62.5-25 MCG/ACT AEPB    Sig: Inhale 1 puff into the lungs daily.    Dispense:  60 each    Refill:  11    Return precautions advised.  Tana Conch, MD

## 2023-11-28 NOTE — Patient Instructions (Addendum)
cholesterol has been well controlled on rosuvastatin 10 mg daily but getting muscle aches- she is feeling better and is going to try every other day and see if symptoms stay away- update me at march physical  Augmentin for sinus infection- see [place] back if not better. Lets try trelegy if covered instead of arnuity- I think it may work better but do need to use it twice a days. We can go back to arnuity if expensive  Recommended follow up: Return for next already scheduled visit or sooner if needed.

## 2023-11-29 ENCOUNTER — Telehealth: Payer: Self-pay | Admitting: Family Medicine

## 2023-11-29 MED ORDER — AMOXICILLIN-POT CLAVULANATE 875-125 MG PO TABS: 1.0000 | ORAL_TABLET | Freq: Two times a day (BID) | ORAL | 0 refills | Status: AC

## 2023-11-29 NOTE — Addendum Note (Signed)
Addended by: Shelva Majestic on: 11/29/2023 12:53 PM   Modules accepted: Orders

## 2023-11-29 NOTE — Telephone Encounter (Signed)
I sent this in just now-please give her my apologies

## 2023-11-29 NOTE — Telephone Encounter (Signed)
Copied from CRM [id number]. Topic: Clinical - Prescription Issue >> Nov 29, 2023  8:37 AM [name] wrote: Reason for CRM: Patient called in stating that she was seen yesterday in office and was supposed to be prescribed an antibiotic by Dr. Durene Cal and she only received her inhaler. Patient is requesting the antibiotic get sent as well as a phone call.

## 2023-11-29 NOTE — Telephone Encounter (Signed)
I see in office note Augmentin; please advise dosage and I'll send to pharmacy

## 2023-12-06 ENCOUNTER — Other Ambulatory Visit: Payer: Self-pay | Admitting: Cardiovascular Disease

## 2023-12-06 DIAGNOSIS — I480 Paroxysmal atrial fibrillation: Secondary | ICD-10-CM

## 2023-12-06 DIAGNOSIS — Z7901 Long term (current) use of anticoagulants: Secondary | ICD-10-CM

## 2023-12-06 NOTE — Telephone Encounter (Signed)
 Prescription refill request for Eliquis received. Indication:afib Last office visit:upcoming Scr:0.74  10/24 Age: 78 Weight:116.5  kg  Prescription refilled

## 2023-12-17 ENCOUNTER — Ambulatory Visit: Payer: Medicare PPO | Attending: Cardiovascular Disease | Admitting: Cardiovascular Disease

## 2023-12-17 ENCOUNTER — Encounter: Payer: Self-pay | Admitting: Cardiovascular Disease

## 2023-12-17 VITALS — BP 120/62 | HR 80 | Ht 63.0 in | Wt 260.0 lb

## 2023-12-17 DIAGNOSIS — I480 Paroxysmal atrial fibrillation: Secondary | ICD-10-CM

## 2023-12-17 DIAGNOSIS — E785 Hyperlipidemia, unspecified: Secondary | ICD-10-CM | POA: Diagnosis not present

## 2023-12-17 NOTE — Patient Instructions (Signed)
 Medication Instructions:  No changes *If you need a refill on your cardiac medications before your next appointment, please call your pharmacy*   Lab Work: None ordered If you have labs (blood work) drawn today and your tests are completely normal, you will receive your results only by: MyChart Message (if you have MyChart) OR A paper copy in the mail If you have any lab test that is abnormal or we need to change your treatment, we will call you to review the results.   Testing/Procedures: None ordered   Follow-Up: At [facility], you and your health needs are our priority.  As part of our continuing mission to provide you with exceptional heart care, we have created designated Provider Care Teams.  These Care Teams include your primary Cardiologist (physician) and Advanced Practice Providers (APPs -  Physician Assistants and Nurse Practitioners) who all work together to provide you with the care you need, when you need it.  We recommend signing up for the patient portal called "MyChart".  Sign up information is provided on this After Visit Summary.  MyChart is used to connect with patients for Virtual Visits (Telemedicine).  Patients are able to view lab/test results, encounter notes, upcoming appointments, etc.  Non-urgent messages can be sent to your provider as well.   To learn more about what you can do with MyChart, go to [email or web address].    Your next appointment:   12 month(s)  Provider:   You may see Lorine Bears, MD or one of the following Advanced Practice Providers on your designated Care Team:   Nicolasa Ducking, NP Eula Listen, PA-C Cadence Fransico Michael, PA-C Charlsie Quest, NP Carlos Levering, NP

## 2023-12-17 NOTE — Progress Notes (Signed)
 Cardiology Office Note   Date:  12/17/2023   ID:  Trey, Bebee 1946/01/17, MRN [id number]  PCP:  Shelva Majestic, MD  Cardiologist:   Lorine Bears, MD   No chief complaint on file.      History of Present Illness: [name] is a 78 y.o. female who presents for a follow-up visit regarding paroxysmal atrial fibrillation.  She has known history of COPD from second hand smoking, and hyperlipidemia. She is a lifelong nonsmoker. She has no history of hypertension or diabetes.  She had a nuclear stress test in 2016 for atypical chest pain and back normal. The patient is on metoprolol and Eliquis. She has been doing well overall with no chest pain.  She reports stable exertional dyspnea with minimal palpitations.  She continues to gain weight due to poor diet choices.  According to her, she likes bread too much.   Past Medical History:  Diagnosis Date   Allergy    ASCUS on Pap smear 2007   ASTHMA UNSPECIFIED WITH EXACERBATION 11/08/2008   Atrial fibrillation (HCC)    Cancer of skin of left ear    Chest pain 03/2011   negative stress test - Dr. Myrtis Ser   History of frequent urinary tract infections    History of kidney stones    HYPERLIPIDEMIA, BORDERLINE 02/09/2009   Kidney stones    OSTEOARTHRITIS 04/24/2007   Osteopenia    Osteoporosis    Pneumonia    PONV (postoperative nausea and vomiting)    SHINGLES 11/08/2008   had vaccine at drugstore 12/14   VARICOSE VEINS LOWER EXTREMITIES W/INFLAMMATION 02/05/2008    Past Surgical History:  Procedure Laterality Date   Bone spur shoulder     x2 removed   COLPOSCOPY     Neg HR HPV   KNEE ARTHROSCOPY     laser surgery per left ey     secondary to film covering    lens implants- both eyes     SPINE SURGERY  2008   lumbar   TOTAL KNEE ARTHROPLASTY Right 08/07/2016   Procedure: RIGHT TOTAL KNEE ARTHROPLASTY;  Surgeon: Durene Romans, MD;  Location: [facility] ORS;  Service: Orthopedics;  Laterality: Right;     Current  Outpatient Medications  Medication Sig Dispense Refill   apixaban (ELIQUIS) 5 MG TABS tablet Take 1 tablet by mouth twice daily 60 tablet 2   Calcium Citrate 250 MG TABS Take 1,000 mg by mouth 2 (two) times daily.     Cholecalciferol (VITAMIN D3) 2000 UNITS TABS Take 2,000 Units by mouth 2 (two) times daily.     fish oil-omega-3 fatty acids 1000 MG capsule Take 2 capsules (2 g total) by mouth 2 (two) times daily. total for 4,000 mg     Fluticasone-Umeclidin-Vilant (TRELEGY ELLIPTA) 100-62.5-25 MCG/ACT AEPB Inhale 1 puff into the lungs daily. 60 each 11   GLUCOSAMINE SULFATE PO Take 2 tablets by mouth 2 (two) times daily.      LORATADINE PO Take 10 mg by mouth daily.     metoprolol tartrate (LOPRESSOR) 25 MG tablet Take 1 tablet (25 mg total) by mouth 2 (two) times daily. Due yearly follow up.  PLEASE CALL OFFICE TO SCHEDULE APPOINTMENT PRIOR TO NEXT REFILL 60 tablet 0   Multiple Vitamin (MULTIVITAMIN) tablet Take 1 tablet by mouth daily.     Multiple Vitamins-Minerals (PRESERVISION AREDS) CAPS      rosuvastatin (CRESTOR) 10 MG tablet Take 1 tablet by mouth once daily 30 tablet 0  PROAIR HFA 108 (90 Base) MCG/ACT inhaler INHALE TWO PUFFS INTO LUNGS EVERY 6 HOURS AS NEEDED FOR WHEEZING FOR SHORTNESS OF BREATH (Patient not taking: Reported on 12/17/2023) 9 each 5   No current facility-administered medications for this visit.    Allergies:   Atorvastatin, Cefuroxime axetil, and Other    Social History:  The patient  reports that she has never smoked. She has been exposed to tobacco smoke. She has never used smokeless tobacco. She reports current alcohol use of about 1.0 standard drink of alcohol per week. She reports that she does not use drugs.   Family History:  The patient's family history includes Breast cancer in her cousin; Cancer in her maternal grandmother; Dementia in her mother; Diabetes in her paternal aunt; Down syndrome in her sister; Heart disease in her father, maternal grandfather,  and paternal grandfather; Hypertension in her father; Melanoma in her mother; Ovarian cancer in her mother; Prostate cancer in her maternal uncle, maternal uncle, and maternal uncle; Sudden Cardiac Death in her son.    ROS:  Please see the history of present illness.   Otherwise, review of systems are positive for none.   All other systems are reviewed and negative.    PHYSICAL EXAM: VS:  BP 120/62   Pulse 80   Ht 5\' 3"  (1.6 m)   Wt 260 lb (117.9 kg)   SpO2 96%   BMI 46.06 kg/m  , BMI Body mass index is 46.06 kg/m. GEN: Well nourished, well developed, in no acute distress  HEENT: normal  Neck: no JVD, carotid bruits, or masses Cardiac: RRR; no murmurs, rubs, or gallops,no edema  Respiratory:  clear to auscultation bilaterally, normal work of breathing GI: soft, nontender, nondistended, + BS MS: no deformity or atrophy  Skin: warm and dry, no rash Neuro:  Strength and sensation are intact Psych: euthymic mood, full affect   EKG:  EKG is ordered today. The ekg ordered today demonstrates: Normal sinus rhythm Low voltage QRS    Recent Labs: 07/30/2023: ALT 13; BUN 24; Creatinine, Ser 0.74; Hemoglobin 12.9; Platelets 153.0; Potassium 4.2; Sodium 141    Lipid Panel    Component Value Date/Time   CHOL 140 12/31/2022 1053   CHOL 221 (H) 06/17/2015 0856   TRIG 99.0 12/31/2022 1053   HDL 56.50 12/31/2022 1053   HDL 67 06/17/2015 0856   CHOLHDL 2 12/31/2022 1053   VLDL 19.8 12/31/2022 1053   LDLCALC 64 12/31/2022 1053   LDLCALC 142 (H) 06/17/2015 0856   LDLDIRECT 149.5 10/24/2012 0831      Wt Readings from Last 3 Encounters:  12/17/23 260 lb (117.9 kg)  11/28/23 256 lb 12.8 oz (116.5 kg)  07/01/23 251 lb (113.9 kg)           No data to display            ASSESSMENT AND PLAN:  1.   Paroxysmal atrial fibrillation: She is doing well on metoprolol with no recurrent symptoms.   She is tolerating anticoagulation with Eliquis with no bleeding complications.   I  reviewed most recent labs which showed no evidence of anemia with normal renal function.  2. Hyperlipidemia: Continue treatment with rosuvastatin.  Most recent lipid profile showed an LDL of 64.   Disposition:   FU with me in 12 months.  Signed,  Lorine Bears, MD  12/17/2023 4:22 PM    [facility]

## 2023-12-24 ENCOUNTER — Encounter: Payer: Self-pay | Admitting: Family Medicine

## 2023-12-24 ENCOUNTER — Ambulatory Visit: Admitting: Family Medicine

## 2023-12-24 VITALS — BP 135/82 | HR 65 | Temp 98.1°F | Ht 63.0 in | Wt 260.6 lb

## 2023-12-24 DIAGNOSIS — H9209 Otalgia, unspecified ear: Secondary | ICD-10-CM | POA: Diagnosis not present

## 2023-12-24 DIAGNOSIS — I4891 Unspecified atrial fibrillation: Secondary | ICD-10-CM | POA: Diagnosis not present

## 2023-12-24 DIAGNOSIS — R739 Hyperglycemia, unspecified: Secondary | ICD-10-CM

## 2023-12-24 MED ORDER — PREDNISONE 50 MG PO TABS: ORAL_TABLET | ORAL | 0 refills | Status: DC

## 2023-12-24 NOTE — Progress Notes (Signed)
   [name] is a 78 y.o. female who presents today for an office visit.  Assessment/Plan:  New/Acute Problems: Otalgia No signs of otitis media on exam.  She may have some eustachian tube dysfunction although she has no obvious effusions on exam today either.  She does have some crepitus with TMJ palpation which likely does explain her symptoms however patient does not think that her current symptoms are consistent with her previous TMJ flares.  We did discuss treatment options.  Do not think she would benefit from additional antibiotics at this point.  Will try a small prednisone burst.  She has done well this previously.  This should hopefully treat any underlying eustachian tube dysfunction/sinusitis as well as TMJ pain.  She will follow-up with her PCP next week.  We could consider referral to ENT if still not improving with this.  Chronic Problems Addressed Today: Paroxysmal atrial fibrillation Regular rate and rhythm today.  Will avoid decongestants such as Sudafed.  Hyperglycemia  Last A1c 5.8.  Do not think she will have any issues with the blood prednisone burst.  She will be seeing her PCP next week for labs.    Subjective:  HPI:  See Assessment / plan for status of chronic conditions.  Patient here with right ear pain for the last week. Worse the last couple of days. Worse when opening her mouth. She saw her PCP a few weeks ago and was given a  prescription for Augmentin for sinusitis. She feels like someone is "crunching paper" in her ear. Hearing is normal. No treatments tried       Objective:  Physical Exam: BP 135/82   Pulse 65   Temp 98.1 F (36.7 C)   Ht 5\' 3"  (1.6 m)   Wt 260 lb 9.6 oz (118.2 kg)   SpO2 97%   BMI 46.16 kg/m   Gen: No acute distress, resting comfortably HEENT: TMs clear bilaterally.  Bilateral TMJ palpated without pain though crepitus noted with active range of motion. Neuro: Grossly normal, moves all extremities Psych: Normal affect and  thought content      [name] M. Jimmey Ralph, MD 12/24/2023 11:05 AM

## 2023-12-24 NOTE — Patient Instructions (Signed)
 It was very nice to see you today!  Please start the prednisone.  Let us know if not improving  Return if symptoms worsen or fail to improve.   Take care, Dr Jimmey Ralph  PLEASE NOTE:  If you had any lab tests, please let us know if you have not heard back within a few days. You may see your results on mychart before we have a chance to review them but we will give you a call once they are reviewed by [place].   If we ordered any referrals today, please let us know if you have not heard from their office within the next week.   If you had any urgent prescriptions sent in today, please check with the pharmacy within an hour of our visit to make sure the prescription was transmitted appropriately.   Please try these tips to maintain a healthy lifestyle:  Eat at least 3 REAL meals and 1-2 snacks per day.  Aim for no more than 5 hours between eating.  If you eat breakfast, please do so within one hour of getting up.   Each meal should contain half fruits/vegetables, one quarter protein, and one quarter carbs (no bigger than a computer mouse)  Cut down on sweet beverages. This includes juice, soda, and sweet tea.   Drink at least 1 glass of water with each meal and aim for at least 8 glasses per day  Exercise at least 150 minutes every week.

## 2024-01-01 ENCOUNTER — Encounter: Payer: Self-pay | Admitting: Family Medicine

## 2024-01-01 ENCOUNTER — Ambulatory Visit (INDEPENDENT_AMBULATORY_CARE_PROVIDER_SITE_OTHER): Payer: Medicare PPO | Admitting: Family Medicine

## 2024-01-01 VITALS — BP 118/72 | HR 97 | Temp 97.3°F | Ht 63.0 in | Wt 257.8 lb

## 2024-01-01 DIAGNOSIS — J432 Centrilobular emphysema: Secondary | ICD-10-CM

## 2024-01-01 DIAGNOSIS — Z131 Encounter for screening for diabetes mellitus: Secondary | ICD-10-CM

## 2024-01-01 DIAGNOSIS — R739 Hyperglycemia, unspecified: Secondary | ICD-10-CM

## 2024-01-01 DIAGNOSIS — G720 Drug-induced myopathy: Secondary | ICD-10-CM

## 2024-01-01 DIAGNOSIS — E785 Hyperlipidemia, unspecified: Secondary | ICD-10-CM

## 2024-01-01 DIAGNOSIS — Z0000 Encounter for general adult medical examination without abnormal findings: Secondary | ICD-10-CM | POA: Diagnosis not present

## 2024-01-01 DIAGNOSIS — I480 Paroxysmal atrial fibrillation: Secondary | ICD-10-CM

## 2024-01-01 LAB — CBC WITH DIFFERENTIAL/PLATELET
Basophils Absolute: 0.1 10*3/uL (ref 0.0–0.1)
Basophils Relative: 0.7 % (ref 0.0–3.0)
Eosinophils Absolute: 0.1 10*3/uL (ref 0.0–0.7)
Eosinophils Relative: 1.5 % (ref 0.0–5.0)
HCT: 41.7 % (ref 36.0–46.0)
Hemoglobin: 13.5 g/dL (ref 12.0–15.0)
Lymphocytes Relative: 14.3 % (ref 12.0–46.0)
Lymphs Abs: 1 10*3/uL (ref 0.7–4.0)
MCHC: 32.5 g/dL (ref 30.0–36.0)
MCV: 97.1 fl (ref 78.0–100.0)
Monocytes Absolute: 0.5 10*3/uL (ref 0.1–1.0)
Monocytes Relative: 8 % (ref 3.0–12.0)
Neutro Abs: 5.2 10*3/uL (ref 1.4–7.7)
Neutrophils Relative %: 75.5 % (ref 43.0–77.0)
Platelets: 168 10*3/uL (ref 150.0–400.0)
RBC: 4.29 Mil/uL (ref 3.87–5.11)
RDW: 14.1 % (ref 11.5–15.5)
WBC: 6.9 10*3/uL (ref 4.0–10.5)

## 2024-01-01 LAB — LIPID PANEL
Cholesterol: 156 mg/dL (ref 0–200)
HDL: 52.6 mg/dL (ref >39.00)
LDL Cholesterol: 81 mg/dL (ref 0–99)
NonHDL: 103.82
Total CHOL/HDL Ratio: 3
Triglycerides: 114 mg/dL (ref 0.0–149.0)
VLDL: 22.8 mg/dL (ref 0.0–40.0)

## 2024-01-01 LAB — COMPREHENSIVE METABOLIC PANEL
ALT: 17 U/L (ref 0–35)
AST: 17 U/L (ref 0–37)
Albumin: 4.1 g/dL (ref 3.5–5.2)
Alkaline Phosphatase: 100 U/L (ref 39–117)
BUN: 22 mg/dL (ref 6–23)
CO2: 31 meq/L (ref 19–32)
Calcium: 9.2 mg/dL (ref 8.4–10.5)
Chloride: 103 meq/L (ref 96–112)
Creatinine, Ser: 0.77 mg/dL (ref 0.40–1.20)
GFR: 74.59 mL/min (ref >60.00)
Glucose, Bld: 96 mg/dL (ref 70–99)
Potassium: 3.9 meq/L (ref 3.5–5.1)
Sodium: 140 meq/L (ref 135–145)
Total Bilirubin: 0.9 mg/dL (ref 0.2–1.2)
Total Protein: 6.8 g/dL (ref 6.0–8.3)

## 2024-01-01 MED ORDER — FLUTICASONE-SALMETEROL 250-50 MCG/ACT IN AEPB: 1.0000 | INHALATION_SPRAY | Freq: Two times a day (BID) | RESPIRATORY_TRACT | 11 refills | Status: AC

## 2024-01-01 NOTE — Addendum Note (Signed)
 Addended by: Shelva Majestic on: 01/01/2024 12:12 PM   Modules accepted: Level of Service

## 2024-01-01 NOTE — Patient Instructions (Addendum)
 copd well controlled on trelegy but side effects intolerable- stop trelegy- start wixela twice a day- if she has similar let me know   update lipids- may be higher but with muscle aches I want her to trial off for a month then go back on twice a week- she will let me know if recurrence  Please stop by lab before you go If you have mychart- we will send your results within 3 business days of [place] receiving them.  If you do not have mychart- we will call you about results within 5 business days of [place] receiving them.  *please also note that you will see labs on mychart as soon as they post. I will later go in and write notes on them- will say "notes from Dr. Durene Cal"     Recommended follow up: Return in about 6 months (around 07/03/2024) for followup or sooner if needed.Schedule b4 you leave.

## 2024-01-01 NOTE — Assessment & Plan Note (Signed)
 S: medication: Rosuvastatin 10 mg every other day- went down from daily due to myalgias, fish oil 1000 mg daily Lab Results  Component Value Date   CHOL 140 12/31/2022   HDL 56.50 12/31/2022   LDLCALC 64 12/31/2022   LDLDIRECT 149.5 10/24/2012   TRIG 99.0 12/31/2022   CHOLHDL 2 12/31/2022  A/P: update lipids- may be higher but with muscle aches I want her to trial off for a month then go back on twice a week- she will let me know if recurrence- consider zetia add on

## 2024-01-01 NOTE — Progress Notes (Signed)
 Phone [phone]   Subjective:  Patient presents today for their annual physical. Chief complaint-noted.   See problem oriented charting- ROS- full  review of systems was completed and negative Per full ROS sheet completed by patient- other than crackling in ear  The following were reviewed and entered/updated in epic: Past Medical History:  Diagnosis Date   Allergy    ASCUS on Pap smear 2007   ASTHMA UNSPECIFIED WITH EXACERBATION 11/08/2008   Atrial fibrillation (HCC)    Cancer of skin of left ear    Chest pain 03/2011   negative stress test - Dr. Myrtis Ser   History of frequent urinary tract infections    History of kidney stones    HYPERLIPIDEMIA, BORDERLINE 02/09/2009   Kidney stones    OSTEOARTHRITIS 04/24/2007   Osteopenia    Osteoporosis    Pneumonia    PONV (postoperative nausea and vomiting)    SHINGLES 11/08/2008   had vaccine at drugstore 12/14   VARICOSE VEINS LOWER EXTREMITIES W/INFLAMMATION 02/05/2008   Patient Active Problem List   Diagnosis Date Noted   Paroxysmal atrial fibrillation (HCC) 12/11/2017    Priority: High   Hyperglycemia 12/31/2022    Priority: Medium    Macular degeneration 12/22/2019    Priority: Medium    Atypical chest pain 12/27/2014    Priority: Medium    Hyperlipidemia 02/09/2009    Priority: Medium    COPD (chronic obstructive pulmonary disease) (HCC) 02/05/2008    Priority: Medium    S/P right TKA 08/07/2016    Priority: Low   Dysphagia 11/14/2015    Priority: Low   Class 3 severe obesity due to excess calories without serious comorbidity with body mass index (BMI) of 40.0 to 44.9 in adult (HCC) 06/17/2014    Priority: Low   Kidney stones 06/17/2014    Priority: Low   Osteopenia     Priority: Low   Allergic rhinitis 05/07/2008    Priority: Low   VARICOSE VEINS LOWER EXTREMITIES W/INFLAMMATION 02/05/2008    Priority: Low   Osteoarthritis 04/24/2007    Priority: Low   Past Surgical History:  Procedure Laterality Date    Bone spur shoulder     x2 removed   COLPOSCOPY     Neg HR HPV   KNEE ARTHROSCOPY     laser surgery per left ey     secondary to film covering    lens implants- both eyes     SPINE SURGERY  2008   lumbar   TOTAL KNEE ARTHROPLASTY Right 08/07/2016   Procedure: RIGHT TOTAL KNEE ARTHROPLASTY;  Surgeon: Durene Romans, MD;  Location: [facility];  Service: Orthopedics;  Laterality: Right;    Family History  Problem Relation Age of Onset   Ovarian cancer Mother    Melanoma Mother    Dementia Mother    Hypertension Father    Heart disease Father        MI 109   Heart disease Paternal Grandfather    Cancer Maternal Grandmother        Oral cancer   Down syndrome Sister    Prostate cancer Maternal Uncle    Heart disease Maternal Grandfather    Prostate cancer Maternal Uncle    Prostate cancer Maternal Uncle    Breast cancer Cousin        two maternal cousins diagnosed <50   Diabetes Paternal Aunt    Sudden Cardiac Death Son        unclear cause.    Colon cancer Neg  Hx    Esophageal cancer Neg Hx    Rectal cancer Neg Hx    Stomach cancer Neg Hx     Medications- reviewed and updated Current Outpatient Medications  Medication Sig Dispense Refill   apixaban (ELIQUIS) 5 MG TABS tablet Take 1 tablet by mouth twice daily 60 tablet 2   Calcium Citrate 250 MG TABS Take 1,000 mg by mouth 2 (two) times daily.     Cholecalciferol (VITAMIN D3) 2000 UNITS TABS Take 2,000 Units by mouth 2 (two) times daily.     fish oil-omega-3 fatty acids 1000 MG capsule Take 2 capsules (2 g total) by mouth 2 (two) times daily. total for 4,000 mg     GLUCOSAMINE SULFATE PO Take 2 tablets by mouth 2 (two) times daily.      LORATADINE PO Take 10 mg by mouth daily.     metoprolol tartrate (LOPRESSOR) 25 MG tablet Take 1 tablet (25 mg total) by mouth 2 (two) times daily. Due yearly follow up.  PLEASE CALL OFFICE TO SCHEDULE APPOINTMENT PRIOR TO NEXT REFILL 60 tablet 0   Multiple Vitamin (MULTIVITAMIN) tablet Take 1  tablet by mouth daily.     Multiple Vitamins-Minerals (PRESERVISION AREDS) CAPS      PROAIR HFA 108 (90 Base) MCG/ACT inhaler INHALE TWO PUFFS INTO LUNGS EVERY 6 HOURS AS NEEDED FOR WHEEZING FOR SHORTNESS OF BREATH 9 each 5   rosuvastatin (CRESTOR) 10 MG tablet Take 1 tablet by mouth once daily 30 tablet 0   fluticasone-salmeterol (WIXELA INHUB) 250-50 MCG/ACT AEPB Inhale 1 puff into the lungs in the morning and at bedtime. 60 each 11   No current facility-administered medications for this visit.    Allergies-reviewed and updated Allergies  Allergen Reactions   Atorvastatin     Myalgia   Cefuroxime Axetil Diarrhea and Nausea And Vomiting   Other     Social History   Social History Narrative   Married 44 years in 2015. 2 kids. 2 grandkids-14 and 12 in 2020.       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 Oropharynx normal Neck: no thyromegaly CV: RRR no murmurs rubs or gallops Lungs: CTAB no crackles, wheeze, rhonchi Abdomen: soft/nontender/nondistended/normal bowel sounds. No rebound or guarding.  Ext: no edema Skin: warm, dry Neuro: grossly normal, moves all extremities, PERRLA   Assessment and Plan   78 y.o. female presenting for annual physical.  Health Maintenance counseling: 1. Anticipatory guidance: Patient counseled regarding regular dental exams -q6 months, eye exams - yearly,  avoiding smoking and second hand smoke , limiting alcohol to 1 beverage per day- 1 glass of wine in last 3 months , no illicit drugs.   2. Risk factor reduction:  Advised patient of need for regular exercise and  diet rich and fruits and vegetables to reduce risk of heart attack and stroke.  Exercise- trying to be active but set back with prior ankle and orthopedic issues last year- prior pool- trying to get back into  one.  Diet/weight management-down slightly from last 2 visits- working on improving dietary intake.  Wt Readings from Last 3 Encounters:  01/01/24 257 lb 12.8 oz (116.9 kg)  12/24/23 260 lb 9.6 oz (118.2 kg)  12/17/23 260 lb (117.9 kg)  3. Immunizations/screenings/ancillary studies- opts COVID shot. Had flu shot 09/06/23 team will add  Immunization History  Administered Date(s) Administered   Fluad Quad(high Dose 65+) 08/18/2020   Influenza Whole 08/15/2000, 08/06/2008   Influenza, High Dose Seasonal PF 10/25/2014, 07/16/2016, 07/16/2017, 08/13/2018, 07/29/2022, 07/30/2023   Influenza,inj,Quad PF,6+ Mos 08/01/2015, 07/30/2019   Influenza-Unspecified 08/15/2021   PFIZER Comirnaty(Gray Top)Covid-19 Tri-Sucrose Vaccine 04/26/2021   PFIZER(Purple Top)SARS-COV-2 Vaccination 11/19/2019, 12/10/2019, 10/26/2020   PPD Test 07/05/2014   Pfizer Covid-19 Vaccine Bivalent Booster 30yrs & up 10/04/2021   Pneumococcal Conjugate-13 10/31/2015   Pneumococcal Polysaccharide-23 08/15/2000, 06/08/2013   Td 09/14/1998, 02/05/2008   Tdap 06/16/2019   Zoster Recombinant(Shingrix) 07/10/2021, 11/10/2021   Zoster, Live 11/06/2013  4. Cervical cancer screening- sees gynecology still given moms history ovarian cancer- sees in April now every 2 years- technically past age based screening recommendations  5. Breast cancer screening-  mammogram - scheduled within next 2 weeks for mammogram 6. Colon cancer screening - 2/16/176  with 10 year repeat planned vs no repeat- she was told she graduated- we will rediscuss 2027 7. Skin cancer screening- Dr. Sharyn Lull annually. advised regular sunscreen use. Denies worrisome, changing, or new skin lesions.  8. Birth control/STD check- postmenopausal/monogomous  9.  Osteoporosis screening at 76- DEXA 12/26/20 with worst T score -1.5 at right femur neck and 10-year FRAX score of 9.2% and 1.6%- takes calcium and vitamin D - offered repeat today - wants to wait until next visit 10. Smoking associated screening - never smoker but prior second hand smoke with trigger as copd  Status of chronic or acute concerns   # Ear pain-concern for eustachian tube dysfunction on visit about 8 days ago with Dr. Jimmey Ralph.  Also thought TMJ may be contributing.  Treated with small prednisone burst- pain has resolved but is having some ongoing crackling in ear  - appears eustachian tube dysfunction - she declines referral to ENT for now. She may try flonase    # Atrial fibrillation- follows with Dr. Kirke Corin S: Rate controlled with metoprolol 25 mg twice daily Anticoagulated with Eliquis 5 mg twice daily A/P: appropriately anticoagulated and rate controlled- continue current medicine   #COPD S: Medication: Trelegy 1 puff daily- wheezing better on this but bad metallic taste and has even thrown up with it- not a good option.  Has albuterol inhaler but not needing  -Previously arnuity ellipta 100 mcg daily and Spiriva with some help.    -By itself Spiriva was not helpful   -Also takes loratadine for allergies  A/P: copd well controlled on trelegy but side effects intolerable- stop trelegy- start wixela twice a day- if she has similar let me know    #hyperlipidemia S: medication: Rosuvastatin 10 mg every other day- went down from daily due to myalgias, fish oil 1000 mg daily Lab Results  Component Value Date   CHOL 140 12/31/2022   HDL 56.50 12/31/2022   LDLCALC 64 12/31/2022   LDLDIRECT 149.5 10/24/2012   TRIG 99.0 12/31/2022   CHOLHDL 2 12/31/2022  A/P: update lipids- may be higher but with muscle aches I want her to trial off for a month then go back on twice a week- she will let me know if recurrence- consider zetia add on   #  Hyperglycemia/insulin  resistance/prediabetes-peak A1c of 5.9 S:  Medication: none, recent prednisone could raise levels Lab Results  Component Value Date   HGBA1C 5.8 07/30/2023   HGBA1C 5.8 12/31/2022   HGBA1C 5.9 12/27/2021  A/P: too soon for a1c repeat- repeat next visit *  Recommended follow up: Return in about 6 months (around 07/03/2024) for followup or sooner if needed.Schedule b4 you leave. Future Appointments  Date Time Provider Department Center  01/01/2024  9:00 AM Shelva Majestic, MD [facility]  01/09/2024  9:15 AM Sherrie George, MD [facility] None  03/23/2024  1:30 PM [facility] ANNUAL WELLNESS VISIT 1 [facility]   Lab/Order associations: fasting   ICD-10-CM   1. Preventative health care  Z00.00     2. Paroxysmal atrial fibrillation (HCC)  I48.0     3. Hyperlipidemia, unspecified hyperlipidemia type  E78.5 Comprehensive metabolic panel    CBC with Differential/Platelet    Lipid panel    4. Centrilobular emphysema (HCC)  J43.2     5. Hyperglycemia  R73.9     6. Screening for diabetes mellitus  Z13.1     7. Drug-induced myopathy  G72.0       Meds ordered this encounter  Medications   fluticasone-salmeterol (WIXELA INHUB) 250-50 MCG/ACT AEPB    Sig: Inhale 1 puff into the lungs in the morning and at bedtime.    Dispense:  60 each    Refill:  11    Return precautions advised.  Tana Conch, MD

## 2024-01-09 ENCOUNTER — Encounter (INDEPENDENT_AMBULATORY_CARE_PROVIDER_SITE_OTHER): Payer: Medicare PPO | Admitting: Ophthalmology

## 2024-01-09 DIAGNOSIS — H353132 Nonexudative age-related macular degeneration, bilateral, intermediate dry stage: Secondary | ICD-10-CM | POA: Diagnosis not present

## 2024-01-09 DIAGNOSIS — H43813 Vitreous degeneration, bilateral: Secondary | ICD-10-CM | POA: Diagnosis not present

## 2024-02-06 ENCOUNTER — Telehealth: Payer: Self-pay | Admitting: Cardiovascular Disease

## 2024-02-06 DIAGNOSIS — I480 Paroxysmal atrial fibrillation: Secondary | ICD-10-CM

## 2024-02-06 MED ORDER — METOPROLOL TARTRATE 25 MG PO TABS
25.0000 mg | ORAL_TABLET | Freq: Two times a day (BID) | ORAL | 3 refills | Status: AC
Start: 2024-02-06 — End: ?

## 2024-02-06 NOTE — Telephone Encounter (Signed)
*  STAT* If patient is at the pharmacy, call can be transferred to refill team.   1. Which medications need to be refilled? (please list name of each medication and dose if known) metoprolol  tartrate (LOPRESSOR ) 25 MG tablet    4. Which pharmacy/location (including street and city if local pharmacy) is medication to be sent to?[facility] - [place] [address]), [place] - [address]    5. Do they need a 30 day or 90 day supply? 90

## 2024-02-14 ENCOUNTER — Other Ambulatory Visit: Payer: Self-pay | Admitting: Pharmacist

## 2024-02-14 ENCOUNTER — Encounter: Payer: Self-pay | Admitting: Pharmacist

## 2024-02-14 DIAGNOSIS — E782 Mixed hyperlipidemia: Secondary | ICD-10-CM

## 2024-02-14 MED ORDER — ROSUVASTATIN CALCIUM 10 MG PO TABS: 10.0000 mg | ORAL_TABLET | Freq: Every day | ORAL | 2 refills | Status: DC

## 2024-02-14 NOTE — Progress Notes (Signed)
 Pharmacy Quality Measure Review  This patient is appearing on a report for being at risk of failing the adherence measure for cholesterol (statin) medications this calendar year.   Medication: rosuvastatin   Last fill date: 11/27/2023 for 30 day supply  Lab Results  Component Value Date   CHOL 156 01/01/2024   HDL 52.60 01/01/2024   LDLCALC 81 01/01/2024   LDLDIRECT 149.5 10/24/2012   TRIG 114.0 01/01/2024   CHOLHDL 3 01/01/2024    Left voicemail for patient to return my call at their convenience. and Will collaborate with provider to facilitate refill needs. Refill request for rosuvastatin  sent to PCP.   Cecilie Coffee, PharmD Clinical Pharmacist [facility]  Population Health [phone]

## 2024-02-25 ENCOUNTER — Telehealth: Payer: Self-pay

## 2024-02-25 ENCOUNTER — Telehealth: Payer: Self-pay | Admitting: Pharmacist

## 2024-02-25 ENCOUNTER — Encounter: Payer: Self-pay | Admitting: Pharmacist

## 2024-02-25 MED ORDER — EZETIMIBE 10 MG PO TABS: 10.0000 mg | ORAL_TABLET | Freq: Every day | ORAL | 1 refills | Status: DC

## 2024-02-25 NOTE — Telephone Encounter (Signed)
 At last visit we were going to have her trial off for a month then restart twice weekly-was the pain/muscle aches even with twice a week?  If so can stop the rosuvastatin  and start Zetia 10 mg daily #90 with 3 refills-less likely to cause muscle aches

## 2024-02-25 NOTE — Telephone Encounter (Signed)
 Patient states she was not able tolerate rosuvastatin . She was having muscle aches and pain so she stopped rosuvastatin  for 4 weeks. Muscle pains improved. At Dr Elverna Hamman recommendation she restarted rosuvastatin  at 10mg  2 times per week but after just 2 weeks, muscle pains returned. She stopped rosuvastatin  and pain resolved. She has not been taking anything for her cholesterol. She had similar side effects with atorvastatin. Dr Arlene Ben had mentioned in his last notes that if she could not tolerate rosuvastatin , he would try a non statin, ezetimibe / Zetia.   Will forward request for ezetimibe 10mg  once daily.

## 2024-02-25 NOTE — Telephone Encounter (Signed)
 FYI.  Copied from CRM [id number]. Topic: Clinical - Medication Question >> Feb 25, 2024 10:15 AM Orien Bird wrote: Reason for CRM: Patient stated she is not taking Rosuvastatin  she stated her legs and stuff are cramping up and she is wanting dr to give her something else other than rosuvastatin .

## 2024-02-25 NOTE — Telephone Encounter (Signed)
 Addressed by Alonso Arthur, pharmacy team

## 2024-02-25 NOTE — Addendum Note (Signed)
 Addended by: Cecilie Coffee B on: 02/25/2024 10:36 AM   Modules accepted: Orders

## 2024-02-25 NOTE — Progress Notes (Addendum)
 Pharmacy Quality Measure Review  This patient is appearing on a report for being at risk of failing the adherence measure for cholesterol (statin) medications this calendar year.   Medication: rosuvastatin   Last fill date: 11/27/2023 for 30 day supply  Lab Results  Component Value Date   CHOL 156 01/01/2024   HDL 52.60 01/01/2024   LDLCALC 81 01/01/2024   LDLDIRECT 149.5 10/24/2012   TRIG 114.0 01/01/2024   CHOLHDL 3 01/01/2024     Patient notified that updated Rx for rosuvastatin  was sent to her pharmacy and is available for pick up.  LM on VM with my CB# is she has questions.  [phone]  Cecilie Coffee, PharmD Clinical Pharmacist [facility]  Population Health [phone]   Addendum:  Patient called back. She states she was not able tolerate rosuvastatin . She was having muscle aches and pain so she stopped rosuvastatin  for 4 weeks. Muscle pains improved. At Dr Elverna Hamman recommendation she restarted rosuvastatin  at 10mg  2 times per week but after just 2 weeks, muscle pains returned. She stopped rosuvastatin  and pain resolved. She has not been taking anything for her cholesterol. She had similar side effects with atorvastatin. Dr Arlene Ben had mentioned in his last notes that if she could not tolerate rosuvastatin , he would try a non statin, ezetimibe / Zetia.   Will forward request for ezetimibe 10mg  once daily.   Cecilie Coffee, PharmD Clinical Pharmacist [facility]  Population Health [phone]

## 2024-03-05 ENCOUNTER — Other Ambulatory Visit: Payer: Self-pay | Admitting: Cardiovascular Disease

## 2024-03-05 DIAGNOSIS — Z7901 Long term (current) use of anticoagulants: Secondary | ICD-10-CM

## 2024-03-05 DIAGNOSIS — I480 Paroxysmal atrial fibrillation: Secondary | ICD-10-CM

## 2024-03-06 NOTE — Telephone Encounter (Signed)
 Eliquis  5mg  refill request received. Patient is 78 years old, weight-116.9kg, Crea-0.77 on 01/01/24, Diagnosis-Afib, and last seen by Dr. Alvenia Aus on 12/17/23. Dose is appropriate based on dosing criteria. Will send in refill to requested pharmacy.

## 2024-03-25 ENCOUNTER — Ambulatory Visit (INDEPENDENT_AMBULATORY_CARE_PROVIDER_SITE_OTHER)

## 2024-03-25 VITALS — Ht 64.0 in | Wt 257.0 lb

## 2024-03-25 DIAGNOSIS — Z0000 Encounter for general adult medical examination without abnormal findings: Secondary | ICD-10-CM | POA: Diagnosis not present

## 2024-03-25 NOTE — Progress Notes (Signed)
 Subjective:   [name] is a 78 y.o. who presents for a Medicare Wellness preventive visit.  As a reminder, Annual Wellness Visits don't include a physical exam, and some assessments may be limited, especially if this visit is performed virtually. We may recommend an in-person follow-up visit with your provider if needed.  Visit Complete: Virtual I connected with  [name] on 03/25/24 by a audio enabled telemedicine application and verified that I am speaking with the correct person using two identifiers.  Patient Location: Home  Provider Location: Home Office  I discussed the limitations of evaluation and management by telemedicine. The patient expressed understanding and agreed to proceed.  Vital Signs: Because this visit was a virtual/telehealth visit, some criteria may be missing or patient reported. Any vitals not documented were not able to be obtained and vitals that have been documented are patient reported.  VideoDeclined- This patient declined Librarian, academic. Therefore the visit was completed with audio only.  Persons Participating in Visit: Patient.  AWV Questionnaire: No: Patient Medicare AWV questionnaire was not completed prior to this visit.  Cardiac Risk Factors include: advanced age (>30men, >61 women);dyslipidemia;obesity (BMI >30kg/m2)     Objective:     Today's Vitals   03/25/24 1413  Weight: 257 lb (116.6 kg)  Height: 5' 4 (1.626 m)   Body mass index is 44.11 kg/m.     03/25/2024    2:15 PM 03/18/2023    4:02 PM 03/29/2022   10:08 AM 03/23/2021   10:27 AM 12/22/2019   10:12 AM 10/31/2017    8:36 AM 08/07/2016    2:43 PM  Advanced Directives  Does Patient Have a Medical Advance Directive? No No No No No No No  Would patient like information on creating a medical advance directive? No - Patient declined No - Patient declined No - Patient declined Yes (MAU/Ambulatory/Procedural Areas - Information given) No - Patient  declined  No - patient declined information    Current Medications (verified) Outpatient Encounter Medications as of 03/25/2024  Medication Sig   apixaban  (ELIQUIS ) 5 MG TABS tablet Take 1 tablet by mouth twice daily   Calcium  Citrate 250 MG TABS Take 1,000 mg by mouth 2 (two) times daily.   Cholecalciferol (VITAMIN D3) 2000 UNITS TABS Take 2,000 Units by mouth 2 (two) times daily.   ezetimibe  (ZETIA ) 10 MG tablet Take 1 tablet (10 mg total) by mouth daily.   fish oil-omega-3 fatty acids  1000 MG capsule Take 2 capsules (2 g total) by mouth 2 (two) times daily. total for 4,000 mg   fluticasone -salmeterol (WIXELA INHUB) 250-50 MCG/ACT AEPB Inhale 1 puff into the lungs in the morning and at bedtime.   GLUCOSAMINE SULFATE PO Take 2 tablets by mouth 2 (two) times daily.    LORATADINE  PO Take 10 mg by mouth daily.   metoprolol  tartrate (LOPRESSOR ) 25 MG tablet Take 1 tablet (25 mg total) by mouth 2 (two) times daily.   Multiple Vitamin (MULTIVITAMIN) tablet Take 1 tablet by mouth daily.   Multiple Vitamins-Minerals (PRESERVISION AREDS) CAPS    PROAIR  HFA 108 (90 Base) MCG/ACT inhaler INHALE TWO PUFFS INTO LUNGS EVERY 6 HOURS AS NEEDED FOR WHEEZING FOR SHORTNESS OF BREATH   No facility-administered encounter medications on file as of 03/25/2024.    Allergies (verified) Rosuvastatin , Atorvastatin, Cefuroxime  axetil, and Other   History: Past Medical History:  Diagnosis Date   Allergy    ASCUS on Pap smear 2007   ASTHMA UNSPECIFIED  WITH EXACERBATION 11/08/2008   Atrial fibrillation (HCC)    Cancer of skin of left ear    Chest pain 03/2011   negative stress test - Dr. Ardell Koller   History of frequent urinary tract infections    History of kidney stones    HYPERLIPIDEMIA, BORDERLINE 02/09/2009   Kidney stones    OSTEOARTHRITIS 04/24/2007   Osteopenia    Osteoporosis    Pneumonia    PONV (postoperative nausea and vomiting)    SHINGLES 11/08/2008   had vaccine at drugstore 12/14   VARICOSE VEINS  LOWER EXTREMITIES W/INFLAMMATION 02/05/2008   Past Surgical History:  Procedure Laterality Date   Bone spur shoulder     x2 removed   COLPOSCOPY     Neg HR HPV   KNEE ARTHROSCOPY     laser surgery per left ey     secondary to film covering    lens implants- both eyes     SPINE SURGERY  2008   lumbar   TOTAL KNEE ARTHROPLASTY Right 08/07/2016   Procedure: RIGHT TOTAL KNEE ARTHROPLASTY;  Surgeon: Claiborne Crew, MD;  Location: [facility];  Service: Orthopedics;  Laterality: Right;   Family History  Problem Relation Age of Onset   Ovarian cancer Mother    Melanoma Mother    Dementia Mother    Hypertension Father    Heart disease Father        MI 97   Heart disease Paternal Grandfather    Cancer Maternal Grandmother        Oral cancer   Down syndrome Sister    Prostate cancer Maternal Uncle    Heart disease Maternal Grandfather    Prostate cancer Maternal Uncle    Prostate cancer Maternal Uncle    Breast cancer Cousin        two maternal cousins diagnosed <50   Diabetes Paternal Aunt    Sudden Cardiac Death Son        unclear cause.    Colon cancer Neg Hx    Esophageal cancer Neg Hx    Rectal cancer Neg Hx    Stomach cancer Neg Hx    Social History   Socioeconomic History   Marital status: Married    Spouse name: Not on file   Number of children: 2   Years of education: Not on file   Highest education level: Not on file  Occupational History   Occupation: Retired    Comment: Runner, broadcasting/film/video   Tobacco Use   Smoking status: Never    Passive exposure: Yes   Smokeless tobacco: Never   Tobacco comments:    last exposure 1999  Vaping Use   Vaping status: Never Used  Substance and Sexual Activity   Alcohol use: Yes    Alcohol/week: 1.0 standard drink of alcohol    Types: 1 Standard drinks or equivalent per week    Comment: monthly   Drug use: No   Sexual activity: Not Currently    Birth control/protection: Post-menopausal    Comment: First IC >8 y/o, <5 Partners, No hx of  STIs, No DES exposure  Other Topics Concern   Not on file  Social History Narrative   Married 44 years in 2015. 2 kids. 2 grandkids-14 and 12 in 2020.       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, yardwork (hates housework), goes on 1 month motorcyle  biking trip each year   Social Drivers of Health   Financial Resource Strain: Low Risk  (03/25/2024)   Overall Financial Resource Strain (CARDIA)    Difficulty of Paying Living Expenses: Not hard at all  Food Insecurity: No Food Insecurity (03/25/2024)   Hunger Vital Sign    Worried About Running Out of Food in the Last Year: Never true    Ran Out of Food in the Last Year: Never true  Transportation Needs: No Transportation Needs (03/25/2024)   PRAPARE - Administrator, Civil Service (Medical): No    Lack of Transportation (Non-Medical): No  Physical Activity: Insufficiently Active (03/25/2024)   Exercise Vital Sign    Days of Exercise per Week: 2 days    Minutes of Exercise per Session: 50 min  Stress: No Stress Concern Present (03/25/2024)   [employer] of Occupational Health - Occupational Stress Questionnaire    Feeling of Stress : Not at all  Social Connections: Moderately Integrated (03/18/2023)   Social Connection and Isolation Panel [NHANES]    Frequency of Communication with Friends and Family: More than three times a week    Frequency of Social Gatherings with Friends and Family: More than three times a week    Attends Religious Services: 1 to 4 times per year    Active Member of [employer] or Organizations: No    Attends Banker Meetings: Never    Marital Status: Married    Tobacco Counseling Counseling given: Not Answered Tobacco comments: last exposure 1999    Clinical Intake:  Pre-visit preparation completed: Yes  Pain : No/denies pain     BMI - recorded: 44.11 Nutritional Status: BMI > 30   Obese Nutritional Risks: None Diabetes: No  Lab Results  Component Value Date   HGBA1C 5.8 07/30/2023   HGBA1C 5.8 12/31/2022   HGBA1C 5.9 12/27/2021     How often do you need to have someone help you when you read instructions, pamphlets, or other written materials from your doctor or pharmacy?: 1 - Never  Interpreter Needed?: No  Information entered by :: Lamont Pilsner, LPN   Activities of Daily Living     03/25/2024    2:15 PM  In your present state of health, do you have any difficulty performing the following activities:  Hearing? 0  Vision? 0  Difficulty concentrating or making decisions? 0  Walking or climbing stairs? 0  Dressing or bathing? 0  Doing errands, shopping? 0  Preparing Food and eating ? N  Using the Toilet? N  In the past six months, have you accidently leaked urine? N  Do you have problems with loss of bowel control? N  Managing your Medications? N  Managing your Finances? N  Housekeeping or managing your Housekeeping? N    Patient Care Team: Almira Jaeger, MD as PCP - General (Family Medicine) Wenona Hamilton, MD as PCP - Cardiology (Cardiology) Wenona Hamilton, MD as Consulting Physician (Cardiology)  I have updated your Care Teams any recent Medical Services you may have received from other providers in the past year.     Assessment:    This is a routine wellness examination for [name].  Hearing/Vision screen Hearing Screening - Comments:: Pt denies any hearing issues  Vision Screening - Comments:: Wears rx glasses - up to date with routine eye exams with Dr Augustus Ledger     Goals Addressed             This Visit's Progress  Patient Stated       Weight loss       Depression Screen     03/25/2024    2:18 PM 07/01/2023   10:45 AM 03/26/2023    1:01 PM 03/18/2023    4:00 PM 12/31/2022    9:37 AM 03/29/2022   10:06 AM 06/30/2021    2:14 PM  PHQ 2/9 Scores  PHQ - 2 Score 0 0 0 0 0 0 0  PHQ- 9 Score  1 1  2       Fall  Risk     03/25/2024    2:23 PM 07/01/2023   10:44 AM 03/26/2023    1:00 PM 03/18/2023    4:03 PM 12/31/2022    9:37 AM  Fall Risk   Falls in the past year? 1 1 0 1 1  Number falls in past yr: 1 0 0  1  Injury with Fall? 1 1 0 0 0  Comment right ankle broken   slipped in mud   Risk for fall due to : History of fall(s) History of fall(s) No Fall Risks Impaired vision History of fall(s)  Follow up Falls prevention discussed Falls evaluation completed Falls evaluation completed Falls prevention discussed Falls evaluation completed    MEDICARE RISK AT HOME:  Medicare Risk at Home Any stairs in or around the home?: Yes If so, are there any without handrails?: No Home free of loose throw rugs in walkways, pet beds, electrical cords, etc?: No Adequate lighting in your home to reduce risk of falls?: No Life alert?: No Use of a cane, walker or w/c?: No Grab bars in the bathroom?: Yes Shower chair or bench in shower?: Yes Elevated toilet seat or a handicapped toilet?: Yes  TIMED UP AND GO:  Was the test performed?    Cognitive Function: 6CIT completed    10/31/2017    8:55 AM  MMSE - Mini Mental State Exam  Not completed: --        03/25/2024    2:23 PM 03/18/2023    4:04 PM 03/29/2022   10:12 AM 03/23/2021   10:31 AM 12/22/2019   10:13 AM  6CIT Screen  What Year? 0 points 0 points 0 points 0 points 0 points  What month? 0 points 0 points 0 points 0 points 0 points  What time? 0 points 0 points 0 points 0 points 0 points  Count back from 20 0 points 0 points 0 points 0 points 0 points  Months in reverse 0 points 0 points 0 points 0 points 0 points  Repeat phrase 0 points 0 points 0 points 0 points 0 points  Total Score 0 points 0 points 0 points 0 points 0 points    Immunizations Immunization History  Administered Date(s) Administered   Fluad Quad(high Dose 65+) 08/18/2020   Influenza Whole 08/15/2000, 08/06/2008   Influenza, High Dose Seasonal PF 10/25/2014, 07/16/2016,  07/16/2017, 08/13/2018, 07/29/2022, 07/30/2023   Influenza,inj,Quad PF,6+ Mos 08/01/2015, 07/30/2019   Influenza-Unspecified 08/15/2021   PFIZER Comirnaty(Gray Top)Covid-19 Tri-Sucrose Vaccine 04/26/2021   PFIZER(Purple Top)SARS-COV-2 Vaccination 11/19/2019, 12/10/2019, 10/26/2020   PPD Test 07/05/2014   Pfizer Covid-19 Vaccine Bivalent Booster 11yrs & up 10/04/2021   Pneumococcal Conjugate-13 10/31/2015   Pneumococcal Polysaccharide-23 08/15/2000, 06/08/2013   Td 09/14/1998, 02/05/2008   Tdap 06/16/2019   Zoster Recombinant(Shingrix) 07/10/2021, 11/10/2021   Zoster, Live 11/06/2013    Screening Tests Health Maintenance  Topic Date Due   COVID-19 Vaccine (6 - 2024-25 season) 06/16/2023   INFLUENZA  VACCINE  05/15/2024   Medicare Annual Wellness (AWV)  03/25/2025   DTaP/Tdap/Td (4 - Td or Tdap) 06/15/2029   Pneumonia Vaccine 92+ Years old  Completed   DEXA SCAN  Completed   Hepatitis C Screening  Completed   Zoster Vaccines- Shingrix  Completed   HPV VACCINES  Aged Out   Meningococcal B Vaccine  Aged Out   Colonoscopy  Discontinued    Health Maintenance  Health Maintenance Due  Topic Date Due   COVID-19 Vaccine (6 - 2024-25 season) 06/16/2023   Health Maintenance Items Addressed: See Nurse Notes at the end of this note  Additional Screening:  Vision Screening: Recommended annual ophthalmology exams for early detection of glaucoma and other disorders of the eye. Would you like a referral to an eye doctor? No    Dental Screening: Recommended annual dental exams for proper oral hygiene  Community Resource Referral / Chronic Care Management: CRR required this visit?  No   CCM required this visit?  No   Plan:    I have personally reviewed and noted the following in the patient's chart:   Medical and social history Use of alcohol, tobacco or illicit drugs  Current medications and supplements including opioid prescriptions. Patient is not currently taking opioid  prescriptions. Functional ability and status Nutritional status Physical activity Advanced directives List of other physicians Hospitalizations, surgeries, and ER visits in previous 12 months Vitals Screenings to include cognitive, depression, and falls Referrals and appointments  In addition, I have reviewed and discussed with patient certain preventive protocols, quality metrics, and best practice recommendations. A written personalized care plan for preventive services as well as general preventive health recommendations were provided to patient.   Bruno Capri, LPN   11/02/1476   After Visit Summary: (MyChart) Due to this being a telephonic visit, the after visit summary with patients personalized plan was offered to patient via MyChart   Notes: Nothing significant to report at this time.

## 2024-03-25 NOTE — Patient Instructions (Signed)
 [name] , Thank you for taking time out of your busy schedule to complete your Annual Wellness Visit with me. I enjoyed our conversation and look forward to speaking with you again next year. I, as well as your care team,  appreciate your ongoing commitment to your health goals. Please review the following plan we discussed and let me know if I can assist you in the future. Your Game plan/ To Do List    Referrals: If you haven't heard from the office you've been referred to, please reach out to them at the phone provided.   Follow up Visits: Next Medicare AWV with our clinical staff: 03/29/25   Have you seen your provider in the last 6 months (3 months if uncontrolled diabetes)? Yes Next Office Visit with your provider: 07/14/24  Clinician Recommendations:  Each day, aim for 6 glasses of water , plenty of protein in your diet and try to get up and walk/ stretch every hour for 5-10 minutes at a time.        This is a list of the screening recommended for you and due dates:  Health Maintenance  Topic Date Due   COVID-19 Vaccine (6 - 2024-25 season) 06/16/2023   Medicare Annual Wellness Visit  03/17/2024   Flu Shot  05/15/2024   DTaP/Tdap/Td vaccine (4 - Td or Tdap) 06/15/2029   Pneumonia Vaccine  Completed   DEXA scan (bone density measurement)  Completed   Hepatitis C Screening  Completed   Zoster (Shingles) Vaccine  Completed   HPV Vaccine  Aged Out   Meningitis B Vaccine  Aged Out   Colon Cancer Screening  Discontinued    Advanced directives: (Copy Requested) Please bring a copy of your health care power of attorney and living will to the office to be added to your chart at your convenience. You can mail to [facility] [address] [place], [place] [address] or email to [email or web address] Advance Care Planning is important because it:  [x]  Makes sure you receive the medical care that is consistent with your values, goals, and preferences  [x]  It provides  guidance to your family and loved ones and reduces their decisional burden about whether or not they are making the right decisions based on your wishes.  Follow the link provided in your after visit summary or read over the paperwork we have mailed to you to help you started getting your Advance Directives in place. If you need assistance in completing these, please reach out to us  so that we can help you!  See attachments for Preventive Care and Fall Prevention Tips.

## 2024-05-14 ENCOUNTER — Telehealth: Payer: Self-pay

## 2024-05-14 NOTE — Telephone Encounter (Signed)
 See below, did you happen to call patient?  Copied from CRM [id number]. Topic: General - Call Back - No Documentation >> May 14, 2024  9:36 AM [name] wrote: Reason for CRM: Patient is calling in for the pharmacy nurse, she stated she got a message regarding her medication but did not catch a name or what medication it was about. Please contact the patient back, nothing noted in the chart.

## 2024-05-14 NOTE — Telephone Encounter (Signed)
 I have not called patient since May. I did call her back to see if I could help her figure out who might have called. I did not see anyone else that might have reached out from our office.  She did start ezetimibe  10mg  and is tolerating well. She will see Dr Katrinka in September to recheck lipids. She has 1 refill remaining to last until her 06/2024.

## 2024-07-14 ENCOUNTER — Encounter: Payer: Self-pay | Admitting: Family Medicine

## 2024-07-14 ENCOUNTER — Ambulatory Visit: Payer: Self-pay | Admitting: Family Medicine

## 2024-07-14 ENCOUNTER — Ambulatory Visit: Admitting: Family Medicine

## 2024-07-14 VITALS — BP 118/62 | HR 73 | Temp 97.2°F | Ht 64.0 in | Wt 260.4 lb

## 2024-07-14 DIAGNOSIS — R739 Hyperglycemia, unspecified: Secondary | ICD-10-CM | POA: Diagnosis not present

## 2024-07-14 DIAGNOSIS — I480 Paroxysmal atrial fibrillation: Secondary | ICD-10-CM | POA: Diagnosis not present

## 2024-07-14 DIAGNOSIS — J432 Centrilobular emphysema: Secondary | ICD-10-CM | POA: Diagnosis not present

## 2024-07-14 DIAGNOSIS — E785 Hyperlipidemia, unspecified: Secondary | ICD-10-CM | POA: Diagnosis not present

## 2024-07-14 DIAGNOSIS — Z131 Encounter for screening for diabetes mellitus: Secondary | ICD-10-CM | POA: Diagnosis not present

## 2024-07-14 DIAGNOSIS — G720 Drug-induced myopathy: Secondary | ICD-10-CM

## 2024-07-14 LAB — COMPREHENSIVE METABOLIC PANEL WITH GFR
ALT: 31 U/L (ref 0–35)
AST: 24 U/L (ref 0–37)
Albumin: 4 g/dL (ref 3.5–5.2)
Alkaline Phosphatase: 116 U/L (ref 39–117)
BUN: 19 mg/dL (ref 6–23)
CO2: 31 meq/L (ref 19–32)
Calcium: 9.2 mg/dL (ref 8.4–10.5)
Chloride: 105 meq/L (ref 96–112)
Creatinine, Ser: 0.73 mg/dL (ref 0.40–1.20)
GFR: 79.23 mL/min (ref >60.00)
Glucose, Bld: 97 mg/dL (ref 70–99)
Potassium: 3.9 meq/L (ref 3.5–5.1)
Sodium: 141 meq/L (ref 135–145)
Total Bilirubin: 0.6 mg/dL (ref 0.2–1.2)
Total Protein: 6.7 g/dL (ref 6.0–8.3)

## 2024-07-14 LAB — HEMOGLOBIN A1C: Hgb A1c MFr Bld: 5.8 % (ref 4.6–6.5)

## 2024-07-14 LAB — LDL CHOLESTEROL, DIRECT: Direct LDL: 106 mg/dL

## 2024-07-14 NOTE — Progress Notes (Signed)
 Phone [phone] In person visit   Subjective:   [name] is a 78 y.o. year old very pleasant female patient who presents for/with See problem oriented charting Chief Complaint  Patient presents with   Hyperlipidemia    6 month follow up;    Medical Management of Chronic Issues    Left lower leg swelling;    Past Medical History-  Patient Active Problem List   Diagnosis Date Noted   Paroxysmal atrial fibrillation (HCC) 12/11/2017    Priority: High   Hyperglycemia 12/31/2022    Priority: Medium    Macular degeneration 12/22/2019    Priority: Medium    Atypical chest pain 12/27/2014    Priority: Medium    Hyperlipidemia 02/09/2009    Priority: Medium    COPD (chronic obstructive pulmonary disease) (HCC) 02/05/2008    Priority: Medium    S/P right TKA 08/07/2016    Priority: Low   Dysphagia 11/14/2015    Priority: Low   Class 3 severe obesity due to excess calories without serious comorbidity with body mass index (BMI) of 40.0 to 44.9 in adult 06/17/2014    Priority: Low   Kidney stones 06/17/2014    Priority: Low   Osteopenia     Priority: Low   Allergic rhinitis 05/07/2008    Priority: Low   VARICOSE VEINS LOWER EXTREMITIES W/INFLAMMATION 02/05/2008    Priority: Low   Osteoarthritis 04/24/2007    Priority: Low    Medications- reviewed and updated Current Outpatient Medications  Medication Sig Dispense Refill   apixaban  (ELIQUIS ) 5 MG TABS tablet Take 1 tablet by mouth twice daily 60 tablet 5   Calcium  Citrate 250 MG TABS Take 1,000 mg by mouth 2 (two) times daily.     Cholecalciferol (VITAMIN D3) 2000 UNITS TABS Take 2,000 Units by mouth 2 (two) times daily.     ezetimibe  (ZETIA ) 10 MG tablet Take 1 tablet (10 mg total) by mouth daily. 90 tablet 1   fish oil-omega-3 fatty acids  1000 MG capsule Take 2 capsules (2 g total) by mouth 2 (two) times daily. total for 4,000 mg     fluticasone -salmeterol (WIXELA INHUB) 250-50 MCG/ACT AEPB Inhale 1 puff into the  lungs in the morning and at bedtime. 60 each 11   GLUCOSAMINE SULFATE PO Take 2 tablets by mouth 2 (two) times daily.      LORATADINE  PO Take 10 mg by mouth daily.     metoprolol  tartrate (LOPRESSOR ) 25 MG tablet Take 1 tablet (25 mg total) by mouth 2 (two) times daily. 180 tablet 3   Multiple Vitamin (MULTIVITAMIN) tablet Take 1 tablet by mouth daily.     Multiple Vitamins-Minerals (PRESERVISION AREDS) CAPS      PROAIR  HFA 108 (90 Base) MCG/ACT inhaler INHALE TWO PUFFS INTO LUNGS EVERY 6 HOURS AS NEEDED FOR WHEEZING FOR SHORTNESS OF BREATH 9 each 5   No current facility-administered medications for this visit.     Objective:  BP 118/62 (BP Location: Left Arm, Patient Position: Sitting, Cuff Size: Normal)   Pulse 73   Temp (!) 97.2 F (36.2 C) (Temporal)   Ht 5' 4 (1.626 m)   Wt 260 lb 6.4 oz (118.1 kg)   SpO2 95%   BMI 44.70 kg/m  Gen: NAD, resting comfortably CV: RRR no murmurs rubs or gallops Lungs: CTAB no crackles, wheeze, rhonchi Ext: 1+ nonpitting edema slightly worse on left Skin: warm, dry    Assessment and Plan   # Left lower leg swelling S:2  days before getting home from 2 week trip out of state to [place]  and [place]  noted some left ankle swelling. Consistent with eliquis . Slightly puffier than the left. Wore compression stocking down but not way back  A/P: risk of DVT very low on long term eliquis - continue to monitor. Could be venous insufficiency- advised compression stockings to be worn consistently.  May also have had more salt intake with travel   # Atrial fibrillation- follows with Dr. Darron RAMAN: Rate controlled with metoprolol  25 mg twice daily Anticoagulated with Eliquis  5 mg twice daily A/P: appropriately anticoagulated and rate controlled- continue current medicine   #COPD- second hand smoke related S: Medication: trial wixela march 2025- has been helpful  Has albuterol  inhaler and typically uses- infrquently- not needing  A/P: copd reasonably  stable/controlled- continue to monitor     #hyperlipidemia with statin myalgia S: medication: Zetia  10 mg daily, fish oil 1000 mg -Myalgias on rosuvastatin  even twice a week Lab Results  Component Value Date   CHOL 156 01/01/2024   HDL 52.60 01/01/2024   LDLCALC 81 01/01/2024   LDLDIRECT 149.5 10/24/2012   TRIG 114.0 01/01/2024   CHOLHDL 3 01/01/2024  A/P: unknown status off of statin- update LDL on Zetia  and off of statin- suspect may be worse but did not tolerate the statin  # Hyperglycemia/insulin resistance/prediabetes-peak A1c of 5.9 S:  Medication: none Exercise and diet- gardening, chickens, weight up slightly with recent travel Lab Results  Component Value Date   HGBA1C 5.8 07/30/2023   HGBA1C 5.8 12/31/2022   HGBA1C 5.9 12/27/2021  A/P: hopefully stable- update a1c today. Continue without meds for now   Recommended follow up: Return in about 6 months (around 01/11/2025) for physical or sooner if needed.Schedule b4 you leave. Future Appointments  Date Time Provider Department Center  01/05/2025  9:15 AM Alvia Norleen BIRCH, MD [facility] None  03/29/2025  2:20 PM [facility] ANNUAL WELLNESS VISIT 1 [facility] [place]   Lab/Order associations:   ICD-10-CM   1. Paroxysmal atrial fibrillation (HCC)  I48.0     2. Hyperlipidemia, unspecified hyperlipidemia type  E78.5 Comprehensive metabolic panel with GFR    LDL cholesterol, direct    3. Hyperglycemia  R73.9 Hemoglobin A1c    4. Screening for diabetes mellitus  Z13.1 Hemoglobin A1c    5. Centrilobular emphysema (HCC)  J43.2     6. Drug-induced myopathy  G72.0      No orders of the defined types were placed in this encounter.  Return precautions advised.  Garnette Lukes, MD

## 2024-07-14 NOTE — Patient Instructions (Addendum)
 Please stop by lab before you go If you have mychart- we will send your results within 3 business days of us  receiving them.  If you do not have mychart- we will call you about results within 5 business days of us  receiving them.  *please also note that you will see labs on mychart as soon as they post. I will later go in and write notes on them- will say notes from Dr. Katrinka   No changes today unless labs lead us  to make changes   Recommended follow up: Return in about 6 months (around 01/11/2025) for physical or sooner if needed.Schedule b4 you leave.

## 2024-07-18 DIAGNOSIS — I4891 Unspecified atrial fibrillation: Secondary | ICD-10-CM | POA: Diagnosis not present

## 2024-07-18 DIAGNOSIS — E785 Hyperlipidemia, unspecified: Secondary | ICD-10-CM | POA: Diagnosis not present

## 2024-07-18 DIAGNOSIS — Z7901 Long term (current) use of anticoagulants: Secondary | ICD-10-CM | POA: Diagnosis not present

## 2024-07-18 DIAGNOSIS — M1990 Unspecified osteoarthritis, unspecified site: Secondary | ICD-10-CM | POA: Diagnosis not present

## 2024-07-18 DIAGNOSIS — D6869 Other thrombophilia: Secondary | ICD-10-CM | POA: Diagnosis not present

## 2024-07-18 DIAGNOSIS — N182 Chronic kidney disease, stage 2 (mild): Secondary | ICD-10-CM | POA: Diagnosis not present

## 2024-07-18 DIAGNOSIS — M8580 Other specified disorders of bone density and structure, unspecified site: Secondary | ICD-10-CM | POA: Diagnosis not present

## 2024-07-18 DIAGNOSIS — J449 Chronic obstructive pulmonary disease, unspecified: Secondary | ICD-10-CM | POA: Diagnosis not present

## 2024-08-04 ENCOUNTER — Ambulatory Visit: Admitting: Family Medicine

## 2024-08-04 ENCOUNTER — Encounter: Payer: Self-pay | Admitting: Family Medicine

## 2024-08-04 ENCOUNTER — Ambulatory Visit: Payer: Self-pay

## 2024-08-04 VITALS — BP 118/70 | HR 85 | Temp 97.9°F | Ht 64.0 in | Wt 257.0 lb

## 2024-08-04 DIAGNOSIS — J432 Centrilobular emphysema: Secondary | ICD-10-CM | POA: Diagnosis not present

## 2024-08-04 DIAGNOSIS — R059 Cough, unspecified: Secondary | ICD-10-CM

## 2024-08-04 DIAGNOSIS — R739 Hyperglycemia, unspecified: Secondary | ICD-10-CM

## 2024-08-04 DIAGNOSIS — I480 Paroxysmal atrial fibrillation: Secondary | ICD-10-CM

## 2024-08-04 LAB — POC COVID19 BINAXNOW: SARS Coronavirus 2 Ag: NEGATIVE

## 2024-08-04 MED ORDER — AMOXICILLIN-POT CLAVULANATE 875-125 MG PO TABS: 1.0000 | ORAL_TABLET | Freq: Two times a day (BID) | ORAL | 0 refills | Status: DC

## 2024-08-04 MED ORDER — ALBUTEROL SULFATE HFA 108 (90 BASE) MCG/ACT IN AERS: 1.0000 | INHALATION_SPRAY | Freq: Four times a day (QID) | RESPIRATORY_TRACT | 0 refills | Status: AC | PRN

## 2024-08-04 MED ORDER — PREDNISONE 50 MG PO TABS: ORAL_TABLET | ORAL | 0 refills | Status: DC

## 2024-08-04 NOTE — Patient Instructions (Addendum)
 It was very nice to see you today!  You have a flareup of COPD and asthma.  Please start the prednisone  and antibiotic.  Use albuterol  as needed.  Let us  know if not improving.  Return if symptoms worsen or fail to improve.   Take care, Dr Kennyth  PLEASE NOTE:  If you had any lab tests, please let us  know if you have not heard back within a few days. You may see your results on mychart before we have a chance to review them but we will give you a call once they are reviewed by us .   If we ordered any referrals today, please let us  know if you have not heard from their office within the next week.   If you had any urgent prescriptions sent in today, please check with the pharmacy within an hour of our visit to make sure the prescription was transmitted appropriately.   Please try these tips to maintain a healthy lifestyle:  Eat at least 3 REAL meals and 1-2 snacks per day.  Aim for no more than 5 hours between eating.  If you eat breakfast, please do so within one hour of getting up.   Each meal should contain half fruits/vegetables, one quarter protein, and one quarter carbs (no bigger than a computer mouse)  Cut down on sweet beverages. This includes juice, soda, and sweet tea.   Drink at least 1 glass of water  with each meal and aim for at least 8 glasses per day  Exercise at least 150 minutes every week.
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
## 2024-08-04 NOTE — Telephone Encounter (Signed)
 Patient scheduled to see Dr. Kennyth today at 2:20pm. Dr. Katrinka will review triage notes.
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
## 2024-08-16 ENCOUNTER — Other Ambulatory Visit: Payer: Self-pay | Admitting: Family Medicine

## 2024-08-27 ENCOUNTER — Ambulatory Visit: Payer: Self-pay | Admitting: *Deleted

## 2024-08-27 NOTE — Telephone Encounter (Signed)
 Requesting PCP to order medication for urinary sx and patient declined appt at this time. Reports she was just seen and took antibiotics on 08/04/24. No appt scheduled. Pt requesting call back.      FYI Only or Action Required?: Action required by provider: update on patient condition and requesting medication .  Patient was last seen in primary care on 08/04/2024 by Kennyth Worth HERO, MD.  Called Nurse Triage reporting Dysuria.  Symptoms began several days ago. 4 days ago   Interventions attempted: OTC medications: drinking more water  and taking cranberry capsules..  Symptoms are: gradually worsening.  Triage Disposition: See Physician Within 24 Hours  Patient/caregiver understands and will follow disposition?: No, wishes to speak with PCP          Copied from CRM #[id number]. Topic: Clinical - Red Word Triage >> Aug 27, 2024  2:53 PM Alfonso ORN wrote: Red Word that prompted transfer to Nurse Triage: uti took otc not working , was getting better but symptoms returning. Reason for Disposition  Urinating more frequently than usual (i.e., frequency) OR new-onset of the feeling of an urgent need to urinate (i.e., urgency)  Answer Assessment - Initial Assessment Questions Recommended appt ,patient declined. Reports she just finished antibiotics from 08/04/24 appt for different sx. Attempted to review need for appt and again patient requesting PCP to order medication. Reports she lives 30 miles away and she is requesting medication for sx.       1. SYMPTOM: What's the main symptom you're concerned about? (e.g., frequency, incontinence)     Burning ,pressure with urination 2. ONSET: When did the  sx   start?     4 days  3. PAIN: Is there any pain? If Yes, ask: How bad is it? (Scale: 1-10; mild, moderate, severe)     Worsening  4. CAUSE: What do you think is causing the symptoms?     UTI  5. OTHER SYMPTOMS: Do you have any other symptoms? (e.g., blood in urine, fever,  flank pain, pain with urination)     Burning , pressure noted with urination. No fever no blood in urine. No back or side pain. 6. PREGNANCY: Is there any chance you are pregnant? When was your last menstrual period?     na  Protocols used: Urinary Symptoms-A-AH

## 2024-08-27 NOTE — Telephone Encounter (Signed)
 Yes she does need an appointment since this is for different issues

## 2024-08-28 NOTE — Telephone Encounter (Signed)
 Patient is scheduled with Dr. Katrinka 08/31/24 at 3pm

## 2024-08-31 ENCOUNTER — Ambulatory Visit: Admitting: Family Medicine

## 2024-08-31 ENCOUNTER — Encounter: Payer: Self-pay | Admitting: Family Medicine

## 2024-08-31 VITALS — BP 128/68 | HR 65 | Temp 97.8°F | Ht 64.0 in | Wt 258.6 lb

## 2024-08-31 DIAGNOSIS — R300 Dysuria: Secondary | ICD-10-CM | POA: Diagnosis not present

## 2024-08-31 DIAGNOSIS — J432 Centrilobular emphysema: Secondary | ICD-10-CM | POA: Diagnosis not present

## 2024-08-31 DIAGNOSIS — R3589 Other polyuria: Secondary | ICD-10-CM | POA: Diagnosis not present

## 2024-08-31 DIAGNOSIS — S46002A Unspecified injury of muscle(s) and tendon(s) of the rotator cuff of left shoulder, initial encounter: Secondary | ICD-10-CM | POA: Diagnosis not present

## 2024-08-31 DIAGNOSIS — I480 Paroxysmal atrial fibrillation: Secondary | ICD-10-CM | POA: Diagnosis not present

## 2024-08-31 LAB — POCT URINALYSIS DIP (CLINITEK)
Bilirubin, UA: NEGATIVE
Glucose, UA: NEGATIVE mg/dL
Ketones, POC UA: NEGATIVE mg/dL
Nitrite, UA: NEGATIVE
POC PROTEIN,UA: NEGATIVE
Spec Grav, UA: >=1.03 — AB (ref 1.010–1.025)
Urobilinogen, UA: 0.2 U/dL
pH, UA: 5.5 (ref 5.0–8.0)
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
## 2024-08-31 NOTE — Patient Instructions (Addendum)
 Urinalysis possible urinary tract infection but has been on AZO which can skew results- does appear dehydrated. Likely UTI. Will get culture. Shed prefer to wait on culture to make sure on right antibiotic(s) unless symptom(s) worsen.  Patient to follow up if new or worsening symptoms or failure to improve.   - a1c has been in prediabetes range but I doubt the urinary frequency is diabetes related- especially with recent a1c within 2 months as well as no sugar in the urine- was on prednisone  but only for 5 days so hopefully will trend back down even if so  Keep drinking that water !   Team please give him exercises for rotator cuff tendonitis I want you to do the exercise 3x a week for a month then once a week for another month. Stop any exercise that causes more than 1-2/10 pain increase. If not doing better within 1-2 months let us  refer you to sports medicine or orthopedics   Recommended follow up: Return for as needed for new, worsening, persistent symptoms.

## 2024-09-02 ENCOUNTER — Ambulatory Visit: Payer: Self-pay | Admitting: Family Medicine

## 2024-09-02 LAB — URINE CULTURE
MICRO NUMBER:: 17244137
SPECIMEN QUALITY:: ADEQUATE

## 2024-09-02 MED ORDER — SULFAMETHOXAZOLE-TRIMETHOPRIM 800-160 MG PO TABS: 1.0000 | ORAL_TABLET | Freq: Two times a day (BID) | ORAL | 0 refills | Status: AC

## 2024-09-06 ENCOUNTER — Other Ambulatory Visit: Payer: Self-pay | Admitting: Cardiovascular Disease

## 2024-09-06 DIAGNOSIS — I480 Paroxysmal atrial fibrillation: Secondary | ICD-10-CM

## 2024-09-06 DIAGNOSIS — Z7901 Long term (current) use of anticoagulants: Secondary | ICD-10-CM

## 2024-09-08 NOTE — Telephone Encounter (Signed)
 Pt last saw Dr Darron 12/17/23, last labs 07/14/24 Creat 0.73, age 78, weight 117.3kg, based on specified criteria pt is on appropriate dosage of Eliquis  5mg  BID for afib.  Will refill rx.

## 2024-11-14 ENCOUNTER — Other Ambulatory Visit: Payer: Self-pay | Admitting: Family Medicine

## 2025-01-05 ENCOUNTER — Encounter (INDEPENDENT_AMBULATORY_CARE_PROVIDER_SITE_OTHER): Admitting: Ophthalmology

## 2025-01-15 ENCOUNTER — Encounter: Admitting: Family Medicine

## 2025-02-19 ENCOUNTER — Encounter: Admitting: Family Medicine

## 2025-03-29 ENCOUNTER — Ambulatory Visit
